# Patient Record
Sex: Male | Born: 1935 | Race: White | Hispanic: No | Smoking: Former smoker
Health system: Southern US, Community
[De-identification: ages and names within clinical notes are randomized; demographics above are authoritative.]

## PROBLEM LIST (undated history)

## (undated) DIAGNOSIS — Z8619 Personal history of other infectious and parasitic diseases: Secondary | ICD-10-CM

## (undated) DIAGNOSIS — E785 Hyperlipidemia, unspecified: Secondary | ICD-10-CM

## (undated) DIAGNOSIS — E039 Hypothyroidism, unspecified: Secondary | ICD-10-CM

## (undated) DIAGNOSIS — I10 Essential (primary) hypertension: Secondary | ICD-10-CM

## (undated) DIAGNOSIS — F810 Specific reading disorder: Secondary | ICD-10-CM

## (undated) DIAGNOSIS — H579 Unspecified disorder of eye and adnexa: Secondary | ICD-10-CM

## (undated) DIAGNOSIS — N400 Enlarged prostate without lower urinary tract symptoms: Secondary | ICD-10-CM

## (undated) DIAGNOSIS — H3530 Unspecified macular degeneration: Secondary | ICD-10-CM

## (undated) HISTORY — PX: POLYPECTOMY: SHX149

## (undated) HISTORY — DX: Specific reading disorder: F81.0

## (undated) HISTORY — DX: Personal history of other infectious and parasitic diseases: Z86.19

## (undated) HISTORY — DX: Unspecified macular degeneration: H35.30

## (undated) HISTORY — DX: Essential (primary) hypertension: I10

## (undated) HISTORY — DX: Hyperlipidemia, unspecified: E78.5

## (undated) HISTORY — DX: Benign prostatic hyperplasia without lower urinary tract symptoms: N40.0

## (undated) HISTORY — DX: Unspecified disorder of eye and adnexa: H57.9

## (undated) HISTORY — DX: Hypothyroidism, unspecified: E03.9

## (undated) HISTORY — PX: COLONOSCOPY: SHX174

---

## 1999-07-18 ENCOUNTER — Emergency Department (HOSPITAL_COMMUNITY): Admission: EM | Admit: 1999-07-18 | Discharge: 1999-07-18 | Payer: Self-pay | Admitting: Emergency Medicine

## 2000-12-09 ENCOUNTER — Encounter: Admission: RE | Admit: 2000-12-09 | Discharge: 2000-12-09 | Payer: Self-pay | Admitting: Urology

## 2000-12-09 ENCOUNTER — Encounter: Payer: Self-pay | Admitting: Urology

## 2002-10-15 LAB — FECAL OCCULT BLOOD, GUAIAC: Fecal Occult Blood: POSITIVE

## 2004-10-31 ENCOUNTER — Ambulatory Visit: Payer: Self-pay | Admitting: Family Medicine

## 2004-11-13 ENCOUNTER — Ambulatory Visit: Payer: Self-pay | Admitting: Family Medicine

## 2005-01-30 ENCOUNTER — Ambulatory Visit: Payer: Self-pay | Admitting: Family Medicine

## 2006-08-25 ENCOUNTER — Ambulatory Visit: Payer: Self-pay | Admitting: Family Medicine

## 2006-08-25 LAB — CONVERTED CEMR LAB
Blood Glucose, Fasting: 70 mg/dL
PSA: 0.98 ng/mL
TSH: 4.99 microintl units/mL

## 2006-09-10 ENCOUNTER — Ambulatory Visit: Payer: Self-pay | Admitting: Gastroenterology

## 2006-09-23 ENCOUNTER — Encounter (INDEPENDENT_AMBULATORY_CARE_PROVIDER_SITE_OTHER): Payer: Self-pay | Admitting: *Deleted

## 2006-09-23 ENCOUNTER — Ambulatory Visit: Payer: Self-pay | Admitting: Gastroenterology

## 2006-09-23 LAB — HM COLONOSCOPY: HM Colonoscopy: NORMAL

## 2006-12-23 ENCOUNTER — Ambulatory Visit: Payer: Self-pay | Admitting: Family Medicine

## 2007-07-28 ENCOUNTER — Encounter: Payer: Self-pay | Admitting: Family Medicine

## 2007-07-28 DIAGNOSIS — E039 Hypothyroidism, unspecified: Secondary | ICD-10-CM

## 2007-07-28 DIAGNOSIS — F528 Other sexual dysfunction not due to a substance or known physiological condition: Secondary | ICD-10-CM

## 2007-07-28 DIAGNOSIS — N400 Enlarged prostate without lower urinary tract symptoms: Secondary | ICD-10-CM

## 2007-07-28 DIAGNOSIS — M109 Gout, unspecified: Secondary | ICD-10-CM

## 2007-07-28 DIAGNOSIS — I10 Essential (primary) hypertension: Secondary | ICD-10-CM | POA: Insufficient documentation

## 2007-09-03 ENCOUNTER — Ambulatory Visit: Payer: Self-pay | Admitting: Family Medicine

## 2007-09-07 LAB — CONVERTED CEMR LAB
ALT: 15 units/L (ref 0–53)
AST: 23 units/L (ref 0–37)
Albumin: 4.2 g/dL (ref 3.5–5.2)
Alkaline Phosphatase: 98 units/L (ref 39–117)
BUN: 6 mg/dL (ref 6–23)
Bilirubin, Direct: 0.2 mg/dL (ref 0.0–0.3)
CO2: 30 meq/L (ref 19–32)
Calcium: 9.6 mg/dL (ref 8.4–10.5)
Chloride: 103 meq/L (ref 96–112)
Cholesterol: 141 mg/dL (ref 0–200)
Creatinine, Ser: 0.9 mg/dL (ref 0.4–1.5)
GFR calc Af Amer: 107 mL/min
GFR calc non Af Amer: 89 mL/min
Glucose, Bld: 81 mg/dL (ref 70–99)
HDL: 20.8 mg/dL — ABNORMAL LOW (ref >39.0)
LDL Cholesterol: 97 mg/dL (ref 0–99)
PSA: 0.6 ng/mL (ref 0.10–4.00)
Phosphorus: 3.7 mg/dL (ref 2.3–4.6)
Potassium: 4.1 meq/L (ref 3.5–5.1)
Sodium: 138 meq/L (ref 135–145)
TSH: 4.6 microintl units/mL (ref 0.35–5.50)
Total Bilirubin: 1 mg/dL (ref 0.3–1.2)
Total CHOL/HDL Ratio: 6.8
Total Protein: 6.8 g/dL (ref 6.0–8.3)
Triglycerides: 118 mg/dL (ref 0–149)
VLDL: 24 mg/dL (ref 0–40)

## 2008-01-05 ENCOUNTER — Ambulatory Visit: Payer: Self-pay | Admitting: Family Medicine

## 2008-01-05 LAB — CONVERTED CEMR LAB
Bacteria, UA: 0
Bilirubin Urine: NEGATIVE
Blood in Urine, dipstick: NEGATIVE
Casts: 0 /lpf
Epithelial cells, urine: 1 /lpf
Glucose, Urine, Semiquant: NEGATIVE
Ketones, urine, test strip: NEGATIVE
Mucus, UA: 0
Nitrite: NEGATIVE
RBC / HPF: 0
Specific Gravity, Urine: <1.005
Urine crystals, microscopic: 0 /hpf
Urobilinogen, UA: 0.2
Yeast, UA: 0
pH: 6

## 2008-09-05 ENCOUNTER — Ambulatory Visit: Payer: Self-pay | Admitting: Family Medicine

## 2008-09-06 LAB — CONVERTED CEMR LAB
ALT: 14 units/L (ref 0–53)
AST: 18 units/L (ref 0–37)
Albumin: 4.3 g/dL (ref 3.5–5.2)
Alkaline Phosphatase: 101 units/L (ref 39–117)
BUN: 13 mg/dL (ref 6–23)
Basophils Absolute: 0.1 10*3/uL (ref 0.0–0.1)
Basophils Relative: 0.8 % (ref 0.0–3.0)
Bilirubin, Direct: 0.1 mg/dL (ref 0.0–0.3)
CO2: 30 meq/L (ref 19–32)
Calcium: 9.4 mg/dL (ref 8.4–10.5)
Chloride: 102 meq/L (ref 96–112)
Cholesterol: 141 mg/dL (ref 0–200)
Creatinine, Ser: 1 mg/dL (ref 0.4–1.5)
Eosinophils Absolute: 0.2 10*3/uL (ref 0.0–0.7)
Eosinophils Relative: 2.3 % (ref 0.0–5.0)
GFR calc Af Amer: 95 mL/min
GFR calc non Af Amer: 78 mL/min
Glucose, Bld: 92 mg/dL (ref 70–99)
HCT: 44.1 % (ref 39.0–52.0)
HDL: 24.9 mg/dL — ABNORMAL LOW (ref >39.0)
Hemoglobin: 15.6 g/dL (ref 13.0–17.0)
LDL Cholesterol: 97 mg/dL (ref 0–99)
Lymphocytes Relative: 32.7 % (ref 12.0–46.0)
MCHC: 35.3 g/dL (ref 30.0–36.0)
MCV: 96.7 fL (ref 78.0–100.0)
Monocytes Absolute: 0.5 10*3/uL (ref 0.1–1.0)
Monocytes Relative: 7.2 % (ref 3.0–12.0)
Neutro Abs: 3.9 10*3/uL (ref 1.4–7.7)
Neutrophils Relative %: 57 % (ref 43.0–77.0)
PSA: 0.54 ng/mL (ref 0.10–4.00)
Phosphorus: 3.8 mg/dL (ref 2.3–4.6)
Platelets: 221 10*3/uL (ref 150–400)
Potassium: 4.3 meq/L (ref 3.5–5.1)
RBC: 4.56 M/uL (ref 4.22–5.81)
RDW: 12.7 % (ref 11.5–14.6)
Sodium: 138 meq/L (ref 135–145)
TSH: 4.86 microintl units/mL (ref 0.35–5.50)
Total Bilirubin: 0.9 mg/dL (ref 0.3–1.2)
Total CHOL/HDL Ratio: 5.7
Total Protein: 7.4 g/dL (ref 6.0–8.3)
Triglycerides: 95 mg/dL (ref 0–149)
Uric Acid, Serum: 5.1 mg/dL (ref 4.0–7.8)
VLDL: 19 mg/dL (ref 0–40)
WBC: 7 10*3/uL (ref 4.5–10.5)

## 2009-09-10 ENCOUNTER — Ambulatory Visit: Payer: Self-pay | Admitting: Family Medicine

## 2009-09-11 LAB — CONVERTED CEMR LAB
ALT: 14 units/L (ref 0–53)
AST: 19 units/L (ref 0–37)
Albumin: 4.4 g/dL (ref 3.5–5.2)
Alkaline Phosphatase: 100 units/L (ref 39–117)
BUN: 6 mg/dL (ref 6–23)
Basophils Absolute: 0 10*3/uL (ref 0.0–0.1)
Basophils Relative: 0.6 % (ref 0.0–3.0)
Bilirubin, Direct: 0.1 mg/dL (ref 0.0–0.3)
CO2: 29 meq/L (ref 19–32)
Calcium: 9.5 mg/dL (ref 8.4–10.5)
Chloride: 103 meq/L (ref 96–112)
Cholesterol: 145 mg/dL (ref 0–200)
Creatinine, Ser: 0.9 mg/dL (ref 0.4–1.5)
Eosinophils Absolute: 0.2 10*3/uL (ref 0.0–0.7)
Eosinophils Relative: 2.7 % (ref 0.0–5.0)
GFR calc non Af Amer: 87.92 mL/min (ref >60)
Glucose, Bld: 96 mg/dL (ref 70–99)
HCT: 46.3 % (ref 39.0–52.0)
HDL: 28.6 mg/dL — ABNORMAL LOW (ref >39.00)
Hemoglobin: 15.5 g/dL (ref 13.0–17.0)
LDL Cholesterol: 99 mg/dL (ref 0–99)
Lymphocytes Relative: 36 % (ref 12.0–46.0)
Lymphs Abs: 2.2 10*3/uL (ref 0.7–4.0)
MCHC: 33.5 g/dL (ref 30.0–36.0)
MCV: 99.6 fL (ref 78.0–100.0)
Monocytes Absolute: 0.4 10*3/uL (ref 0.1–1.0)
Monocytes Relative: 7 % (ref 3.0–12.0)
Neutro Abs: 3.3 10*3/uL (ref 1.4–7.7)
Neutrophils Relative %: 53.7 % (ref 43.0–77.0)
PSA: 0.51 ng/mL (ref 0.10–4.00)
Phosphorus: 3.3 mg/dL (ref 2.3–4.6)
Platelets: 222 10*3/uL (ref 150.0–400.0)
Potassium: 4.4 meq/L (ref 3.5–5.1)
RBC: 4.65 M/uL (ref 4.22–5.81)
RDW: 13 % (ref 11.5–14.6)
Sodium: 138 meq/L (ref 135–145)
TSH: 3.27 microintl units/mL (ref 0.35–5.50)
Total Bilirubin: 1.1 mg/dL (ref 0.3–1.2)
Total CHOL/HDL Ratio: 5
Total Protein: 7.3 g/dL (ref 6.0–8.3)
Triglycerides: 87 mg/dL (ref 0.0–149.0)
Uric Acid, Serum: 5.1 mg/dL (ref 4.0–7.8)
VLDL: 17.4 mg/dL (ref 0.0–40.0)
WBC: 6.1 10*3/uL (ref 4.5–10.5)

## 2009-11-05 ENCOUNTER — Ambulatory Visit: Payer: Self-pay | Admitting: Family Medicine

## 2009-11-05 DIAGNOSIS — E786 Lipoprotein deficiency: Secondary | ICD-10-CM

## 2010-09-03 ENCOUNTER — Telehealth (INDEPENDENT_AMBULATORY_CARE_PROVIDER_SITE_OTHER): Payer: Self-pay | Admitting: *Deleted

## 2010-09-09 ENCOUNTER — Ambulatory Visit: Payer: Self-pay | Admitting: Family Medicine

## 2010-09-10 LAB — CONVERTED CEMR LAB
ALT: 16 units/L (ref 0–53)
AST: 21 units/L (ref 0–37)
Albumin: 4.3 g/dL (ref 3.5–5.2)
Alkaline Phosphatase: 120 units/L — ABNORMAL HIGH (ref 39–117)
BUN: 10 mg/dL (ref 6–23)
Basophils Absolute: 0.1 10*3/uL (ref 0.0–0.1)
CO2: 30 meq/L (ref 19–32)
Chloride: 100 meq/L (ref 96–112)
Cholesterol: 143 mg/dL (ref 0–200)
Eosinophils Relative: 2.5 % (ref 0.0–5.0)
Glucose, Bld: 107 mg/dL — ABNORMAL HIGH (ref 70–99)
HCT: 46.5 % (ref 39.0–52.0)
Lymphs Abs: 2.9 10*3/uL (ref 0.7–4.0)
Monocytes Absolute: 0.7 10*3/uL (ref 0.1–1.0)
Monocytes Relative: 8.8 % (ref 3.0–12.0)
Neutrophils Relative %: 53.8 % (ref 43.0–77.0)
Platelets: 257 10*3/uL (ref 150.0–400.0)
Potassium: 5.4 meq/L — ABNORMAL HIGH (ref 3.5–5.1)
RDW: 14 % (ref 11.5–14.6)
Sodium: 138 meq/L (ref 135–145)
TSH: 6.15 microintl units/mL — ABNORMAL HIGH (ref 0.35–5.50)
Total Protein: 7.4 g/dL (ref 6.0–8.3)
Uric Acid, Serum: 4.8 mg/dL (ref 4.0–7.8)
VLDL: 34.2 mg/dL (ref 0.0–40.0)
WBC: 8.4 10*3/uL (ref 4.5–10.5)

## 2010-09-25 ENCOUNTER — Ambulatory Visit: Payer: Self-pay | Admitting: Family Medicine

## 2010-09-25 DIAGNOSIS — E875 Hyperkalemia: Secondary | ICD-10-CM | POA: Insufficient documentation

## 2010-09-25 DIAGNOSIS — R739 Hyperglycemia, unspecified: Secondary | ICD-10-CM

## 2010-09-25 DIAGNOSIS — R7303 Prediabetes: Secondary | ICD-10-CM | POA: Insufficient documentation

## 2010-10-23 ENCOUNTER — Ambulatory Visit
Admission: RE | Admit: 2010-10-23 | Discharge: 2010-10-23 | Payer: Self-pay | Source: Home / Self Care | Attending: Family Medicine | Admitting: Family Medicine

## 2010-10-23 ENCOUNTER — Other Ambulatory Visit: Payer: Self-pay | Admitting: Family Medicine

## 2010-10-23 LAB — HEMOGLOBIN A1C: Hgb A1c MFr Bld: 5.4 % (ref 4.6–6.5)

## 2010-10-23 LAB — POTASSIUM: Potassium: 4.9 mEq/L (ref 3.5–5.1)

## 2010-10-23 LAB — TSH: TSH: 4.47 u[IU]/mL (ref 0.35–5.50)

## 2010-11-12 NOTE — Assessment & Plan Note (Signed)
Summary: 2 month follow up/[facility]h   Vital Signs:  Patient profile:   77 year old male Weight:      223 pounds BMI:     31.21 Temp:     97.8 degrees F oral Pulse rate:   68 / minute Pulse rhythm:   regular BP sitting:   121 / 82  (left arm) Cuff size:   large  Vitals Entered By: Lowella Petties CMA (November 05, 2009 8:21 AM)  Serial Vital Signs/Assessments:  Time      Position  BP       Pulse  Resp  Temp     By                     130/80                         Judith Part MD  CC: 2 month follow up   History of Present Illness: here for f/u HTN and other med problems   first bp today is 142/100-- then much better after sitting 121/82 pt was in a hurry to get here today last visit did inc zestril to 20 mg daily -- did take hismed this am  no side eff   not as much exercise as he gets in the summer   thyroid stable with tx tsh   lipids stable - trig 87/ HDL 28 (up a bit ) and LDL 99  psa .51- stable   uric acid in nl on allopurinol  Allergies: No Known Drug Allergies  Past History:  Past Medical History: Last updated: October 01, 2008 Gout Hypertension Hypothyroidism Benign prostatic hypertrophy shingles in the past   Past Surgical History: Last updated: 07/28/2007 Colonoscopy- polyps, hemorrhoids (12/2002) Colonoscopy- polyps, hemorrhoids (09/2006)  Family History: Last updated: 10-01-08 Father: deceased- lung cancer, smoker, CAD Mother: deceased- CABG Siblings: 2 brothers, 1 with lung disease from smoking. 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 Eyes:  Denies blurring and eye pain. CV:  Denies chest pain or discomfort, lightheadness,  and palpitations. Resp:  Denies cough and wheezing. GI:  Denies abdominal pain and change in bowel habits. GU:  Denies urinary frequency. MS:  Denies muscle aches. Derm:  Denies lesion(s), poor wound healing, and rash. Neuro:  Denies numbness and tingling. Psych:  mood is generally good. Endo:  Denies cold intolerance and heat intolerance. Heme:  Denies abnormal bruising and bleeding.  Physical Exam  General:  Well-developed,well-nourished,in no acute distress; alert,appropriate and cooperative throughout examination Head:  normocephalic, atraumatic, and no abnormalities observed.   Eyes:  vision grossly intact, pupils equal, pupils round, and pupils reactive to light.  no conjunctival pallor, injection or icterus  Neck:  supple with full rom and no masses or thyromegally, no JVD or carotid bruit  Lungs:  Normal respiratory effort, chest expands symmetrically. Lungs are clear to auscultation, no crackles or wheezes. Heart:  Normal rate and regular rhythm. S1 and S2 normal without gallop, murmur, click, rub or other extra sounds. Extremities:  No clubbing, cyanosis, edema, or deformity noted with normal full range of motion of all joints.   Neurologic:  gait normal and DTRs symmetrical  and normal.   Skin:  Intact without suspicious lesions or rashes Cervical Nodes:  No lymphadenopathy noted Psych:  normal affect, talkative and pleasant    Impression & Recommendations:  Problem # 1:  HYPERTENSION (ICD-401.9) Assessment Improved  improved with zestril 20-- will continue that dose disc healthy diet (low simple sugar/ choose complex carbs/ low sat fat) diet and exercise in detail  f/u 1 year  labs reviewed today His updated medication list for this problem includes:    Zestril 20 Mg Tabs (Lisinopril) .Marland Kitchen... 1 by mouth once daily  BP today: 121/82 Prior BP: 156/74 (09/10/2009)  Labs Reviewed: K+: 4.4 (09/10/2009) Creat: : 0.9 (09/10/2009)   Chol: 145 (09/10/2009)   HDL: 28.60  (09/10/2009)   LDL: 99 (09/10/2009)   TG: 87.0 (09/10/2009)  Problem # 2:  HYPOTHYROIDISM (ICD-244.9) Assessment: Unchanged  tsh is stable without meds- will continue to monitor no clinical changes   Labs Reviewed: TSH: 3.27 (09/10/2009)    Chol: 145 (09/10/2009)   HDL: 28.60 (09/10/2009)   LDL: 99 (09/10/2009)   TG: 87.0 (09/10/2009)  Problem # 3:  LOW HDL (ICD-272.5) Assessment: Unchanged  with nl LDL  disc omega 3 fish oil- will add that and work on exercise re check 1 y  Labs Reviewed: SGOT: 19 (09/10/2009)   SGPT: 14 (09/10/2009)   HDL:28.60 (09/10/2009), 24.9 (09/05/2008)  LDL:99 (09/10/2009), 97 (09/05/2008)  Chol:145 (09/10/2009), 141 (09/05/2008)  Trig:87.0 (09/10/2009), 95 (09/05/2008)  Complete Medication List: 1)  Allopurinol 100 Mg Tabs (Allopurinol) .... One by mouth once daily 2)  Zestril 20 Mg Tabs (Lisinopril) .Marland Kitchen.. 1 by mouth once daily  Patient Instructions: 1)  get omega 3 fish oil -- take 1000-3000 mg daily 2)  this will help your good cholesterol (along with exercise) 3)  blood pressure is better today 4)  no change in medicines  5)  follow up in about a year   Prior Medications (reviewed today): ALLOPURINOL 100 MG  TABS (ALLOPURINOL) one by mouth once daily ZESTRIL 20 MG TABS (LISINOPRIL) 1 by mouth once daily Current Allergies: No known allergies

## 2010-11-12 NOTE — Progress Notes (Signed)
----   Converted from flag ---- ---- 09/03/2010 10:52 AM, Colon Flattery Tower MD wrote: please check lipid/hepatic/ renal / cbc with diff/ tsh / uric acid / psa for gout/ prostate screen/ hypothyroidism   ---- 08/30/2010 12:10 PM, Liane Comber CMA (AAMA) wrote: Lab orders please! Good Morning! This pt is scheduled for cpx labs Monday, which labs to draw and dx codes to use? Thanks Tasha ------------------------------

## 2010-11-14 NOTE — Assessment & Plan Note (Signed)
Summary: CPX/DLO  R/S FROM 09/16/10   Vital Signs:  Patient profile:   77 year old male Height:      71.5 inches Weight:      223.50 pounds BMI:     30.85 Temp:     98.1 degrees F oral Pulse rate:   72 / minute Pulse rhythm:   regular BP sitting:   124 / 76  (left arm) Cuff size:   large  Vitals Entered By: Lewanda Rife LPN (September 25, 2010 8:36 AM) CC: CPX   History of Present Illness: here for check up of chronic med problems and also to [facility] mt list   wt is stable with bmi of 30  gout-- uric acid nl  no problems with gout at all   hypothyroid - tsh is up -- ? missed doses has had some trouble with skin being dry  no fatigue   bph -- psa is low at .53 no problems - no nocturia    colonosc 12/07 polyp--due 03-Mar-2011  Td 2010 flu shot utd  K was 5.4 --? why is on ace inhibitor  eats at least 1 banana per day    lipids stable Last Lipid ProfileCholesterol: 143 (09/09/2010 9:01:22 AM)HDL:  24.80 (09/09/2010 9:01:22 AM)LDL:  84 (09/09/2010 9:01:22 AM)Triglycerides:  Last Liver profileSGOT:  21 (09/09/2010 9:01:22 AM)SPGT:  16 (09/09/2010 9:01:22 AM)T. Bili:  0.8 (09/09/2010 9:01:22 AM)Alk Phos:  120 (09/09/2010 9:01:22 AM)   glucose 107- need to watch this   ? pneumovax-- is considering based on cost  ? zoster -- consid based on cost   Allergies (verified): No Known Drug Allergies  Past History:  Past Medical History: Last updated: 2008/09/14 Gout Hypertension Hypothyroidism Benign prostatic hypertrophy shingles in the past   Past Surgical History: Last updated: 07/28/2007 Colonoscopy- polyps, hemorrhoids (12/2002) Colonoscopy- polyps, hemorrhoids (09/2006)  Family History: Last updated: 14-Sep-2008 Father: deceased- lung cancer, smoker, CAD Mother: deceased- CABG Siblings: 2 brothers, 1 with lung disease from smoking. 5 sisters brother with lung cancer from smoking   Social History: Last updated: September 14, 2008 Marital Status:  Married Children: 1 child who died in MVA in 1979-03-03 Occupation: construction quit smoking 1970 no alcohol - in 20 years   Risk Factors: Smoking Status: quit (07/28/2007)  Review of Systems General:  Denies fatigue, loss of appetite, and malaise. Eyes:  Denies blurring and eye irritation. CV:  Denies chest pain or discomfort, lightheadness, palpitations, and shortness of breath with exertion. GI:  Denies abdominal pain, change in bowel habits, indigestion, nausea, and vomiting. GU:  Complains of erectile dysfunction; denies urinary frequency and urinary hesitancy. MS:  Denies joint pain, joint redness, and joint swelling. Derm:  Denies itching, lesion(s), poor wound healing, and rash. Neuro:  Denies numbness and tingling. Psych:  Denies anxiety and depression. Endo:  Denies cold intolerance, excessive thirst, excessive urination, and heat intolerance. Heme:  Denies abnormal bruising and bleeding.  Physical Exam  General:  Well-developed,well-nourished,in no acute distress; alert,appropriate and cooperative throughout examination Head:  normocephalic, atraumatic, and no abnormalities observed.   Eyes:  vision grossly intact, pupils equal, pupils round, and pupils reactive to light.  no conjunctival pallor, injection or icterus  Ears:  R ear normal and L ear normal.   Nose:  no nasal discharge.   Mouth:  pharynx pink and moist.   Neck:  supple with full rom and no masses or thyromegally, no JVD or carotid bruit  Chest Wall:  No deformities, masses, tenderness or gynecomastia noted. Lungs:  Normal respiratory effort, chest expands symmetrically. Lungs are clear to auscultation, no crackles or wheezes. Heart:  Normal rate and regular rhythm. S1 and S2 normal without gallop, murmur, click, rub or other extra sounds. Abdomen:  Bowel sounds positive,abdomen soft and non-tender without masses, organomegaly or hernias noted. no renal bruits  Rectal:  No external abnormalities noted. Normal  sphincter tone. No rectal masses or tenderness. Prostate:  prostate mildly enl - about 30 g  nontender, smooth, symmetric no change from last exam Msk:  No deformity or scoliosis noted of thoracic or lumbar spine.  no acute joint changes  Pulses:  R and L carotid,radial,femoral,dorsalis pedis and posterior tibial pulses are full and equal bilaterally Extremities:  No clubbing, cyanosis, edema, or deformity noted with normal full range of motion of all joints.   Neurologic:  sensation intact to light touch, gait normal, and DTRs symmetrical and normal.   Skin:  Intact without suspicious lesions or rashes Cervical Nodes:  No lymphadenopathy noted Inguinal Nodes:  No significant adenopathy Psych:  normal affect, talkative and pleasant    Impression & Recommendations:  Problem # 1:  LOW HDL (ICD-272.5) Assessment Unchanged  cholesterol is about the same disc exercise and fish oil as needed  disc omega 3 and exercise to inc hdl  Labs Reviewed: SGOT: 21 (09/09/2010)   SGPT: 16 (09/09/2010)   HDL:24.80 (09/09/2010), 28.60 (09/10/2009)  LDL:84 (09/09/2010), 99 (09/10/2009)  Chol:143 (09/09/2010), 145 (09/10/2009)  Trig:171.0 (09/09/2010), 87.0 (09/10/2009)  Problem # 2:  BENIGN PROSTATIC HYPERTROPHY (ICD-600.00) Assessment: Unchanged this is stable dre today psa is normal   Problem # 3:  HYPOTHYROIDISM (ICD-244.9) Assessment: Deteriorated  now requires some low dose synthroid  no symptoms except dry skin will px this and then follow in 1 mo   His updated medication list for this problem includes:    Synthroid 25 Mcg Tabs (Levothyroxine sodium) .Marland Kitchen... 1 by mouth once daily  Orders: Flu Vaccine 2yrs + MEDICARE PATIENTS (Z6109) Prescription Created Electronically [phone])  Problem # 4:  HYPERTENSION (ICD-401.9) Assessment: Unchanged  bp good in light of new hyperkalemia cut in 1/2 and then re check  disc low sodium diet  His updated medication list for this problem includes:     Zestril 20 Mg Tabs (Lisinopril) .Marland Kitchen... 1/2  by mouth once daily  BP today: 124/76 Prior BP: 121/82 (11/05/2009)  Labs Reviewed: K+: 5.4 (09/09/2010) Creat: : 1.0 (09/09/2010)   Chol: 143 (09/09/2010)   HDL: 24.80 (09/09/2010)   LDL: 84 (09/09/2010)   TG: 171.0 (09/09/2010)  Problem # 5:  GOUT (ICD-274.9) Assessment: Unchanged  this is very well controlled with allopurinol  uric acid nl and no gout flares  His updated medication list for this problem includes:    Allopurinol 100 Mg Tabs (Allopurinol) ..... One by mouth once daily  Orders: Flu Vaccine 3yrs + MEDICARE PATIENTS (U9811) Prescription Created Electronically [phone])  Problem # 6:  HYPERGLYCEMIA (ICD-790.29) Assessment: New 107- mildly high disc low sugar diet /exercise  will check AIc in 1 mo   Problem # 7:  HYPERKALEMIA (ICD-276.7) Assessment: New this is new will cut dose of ace and see if it comes down no supplements with K re check 1 mo with bp check  Complete Medication List: 1)  Allopurinol 100 Mg Tabs (Allopurinol) .... One by mouth once daily 2)  Zestril 20 Mg Tabs (Lisinopril) .... 1/2  by mouth once daily 3)  Synthroid 25 Mcg Tabs (Levothyroxine sodium) .Marland Kitchen.. 1 by mouth once daily  Other Orders: Administration Flu vaccine - MCR [phone])  Patient Instructions: 1)  if you are interested in pneumonia shot - call your medicare insurance and find out if it is covered (you could get it in a month)  2)  also if you are interested in shingles shot -- also call your insurance  3)  watch sugars in diet -- your sugar is up a bit 4)  start synthroid for low thyroid function 5)  cut your lidinopril (zestril ) in 1/2 and take half pill per day 6)  schedule labs non fasting in 1 mo for tsh/ potassium level and AIC for 244.9 and hyperglycemia and hyperkalemia  7)  please do nurse visit that day also for bp check for HTN Prescriptions: SYNTHROID 25 MCG TABS (LEVOTHYROXINE SODIUM) 1 by mouth once daily  #30 x 3    Entered and Authorized by:   Judith Part MD   Signed by:   Judith Part MD on 09/25/2010   Method used:   Electronically to        [facility] [address]* (retail)       [address]       [place], [place]  [address]       Ph: [phone]       Fax: [phone]   RxID:   [id number] ALLOPURINOL 100 MG  TABS (ALLOPURINOL) one by mouth once daily  #90 x 3   Entered and Authorized by:   Judith Part MD   Signed by:   Judith Part MD on 09/25/2010   Method used:   Electronically to        [facility] [address] (retail)       [address]       [place], [place]  [address]       Ph: [phone]       Fax: [phone]   RxID:   [id number]    Orders Added: 1)  Flu Vaccine 72yrs + MEDICARE PATIENTS [Q2039] 2)  Administration Flu vaccine - MCR [G0008] 3)  Prescription Created Electronically [G8553] 4)  Est. Patient Level IV [60109]    Current Allergies (reviewed today): No known allergies     Flu Vaccine Consent Questions     Do you have a history of severe allergic reactions to this vaccine? no    Any prior history of allergic reactions to egg and/or gelatin? no    Do you have a sensitivity to the preservative Thimersol? no    Do you have a past history of Guillan-Barre Syndrome? no    Do you currently have an acute febrile illness? no    Have you ever had a severe reaction to latex? no    Vaccine information given and explained to patient? yes    Are you currently pregnant? no    Lot Number:[id number]   Exp Date:04/12/2011   Site Given  Left Deltoid IMmedflu1 Lewanda Rife LPN  September 25, 2010 8:41 AM

## 2010-11-14 NOTE — Assessment & Plan Note (Signed)
Summary: TOWER BP CHECK PER [facility]  Nurse Visit   Vital Signs:  Patient profile:   77 year old male Height:      71.5 inches Weight:      223.50 pounds Pulse rate:   76 / minute Pulse rhythm:   regular BP sitting:   142 / 80  (right arm) Cuff size:   large  Vitals Entered By: Delilah Shan CMA Duncan Dull) (October 23, 2010 9:35 AM) CC: Nurse visit - BP check per Dr. Milinda Antis.  Pt. states considering  lowering  BP med.   Allergies: No Known Drug Allergies  Orders Added: 1)  Est. Patient Level I [04540]  Appended Document: TOWER BP CHECK PER [facility] please tell pt to stay on current dose of med   Appended Document: TOWER BP CHECK PER [facility] Patient notified as instructed by telephone.

## 2011-02-03 ENCOUNTER — Other Ambulatory Visit: Payer: Self-pay | Admitting: *Deleted

## 2011-02-03 MED ORDER — LEVOTHYROXINE SODIUM 25 MCG PO TABS: 25.0000 ug | ORAL_TABLET | Freq: Every day | ORAL | Status: DC

## 2011-05-12 ENCOUNTER — Other Ambulatory Visit: Payer: Self-pay | Admitting: Dermatology

## 2011-09-10 ENCOUNTER — Other Ambulatory Visit (INDEPENDENT_AMBULATORY_CARE_PROVIDER_SITE_OTHER): Payer: Self-pay

## 2011-09-10 ENCOUNTER — Telehealth: Payer: Self-pay | Admitting: Family Medicine

## 2011-09-10 DIAGNOSIS — R7309 Other abnormal glucose: Secondary | ICD-10-CM

## 2011-09-10 DIAGNOSIS — E875 Hyperkalemia: Secondary | ICD-10-CM

## 2011-09-10 DIAGNOSIS — I10 Essential (primary) hypertension: Secondary | ICD-10-CM

## 2011-09-10 DIAGNOSIS — N400 Enlarged prostate without lower urinary tract symptoms: Secondary | ICD-10-CM

## 2011-09-10 DIAGNOSIS — Z125 Encounter for screening for malignant neoplasm of prostate: Secondary | ICD-10-CM

## 2011-09-10 DIAGNOSIS — E039 Hypothyroidism, unspecified: Secondary | ICD-10-CM

## 2011-09-10 DIAGNOSIS — E786 Lipoprotein deficiency: Secondary | ICD-10-CM

## 2011-09-10 LAB — LIPID PANEL
LDL Cholesterol: 89 mg/dL (ref 0–99)
Total CHOL/HDL Ratio: 5
Triglycerides: 125 mg/dL (ref 0.0–149.0)
VLDL: 25 mg/dL (ref 0.0–40.0)

## 2011-09-10 LAB — CBC WITH DIFFERENTIAL/PLATELET
Basophils Absolute: 0 10*3/uL (ref 0.0–0.1)
Eosinophils Absolute: 0.2 10*3/uL (ref 0.0–0.7)
Lymphocytes Relative: 33.1 % (ref 12.0–46.0)
MCHC: 33.5 g/dL (ref 30.0–36.0)
Neutro Abs: 3.6 10*3/uL (ref 1.4–7.7)
Neutrophils Relative %: 55.2 % (ref 43.0–77.0)
RDW: 13.6 % (ref 11.5–14.6)

## 2011-09-10 LAB — COMPREHENSIVE METABOLIC PANEL
ALT: 17 U/L (ref 0–53)
AST: 24 U/L (ref 0–37)
Calcium: 9.8 mg/dL (ref 8.4–10.5)
Chloride: 101 mEq/L (ref 96–112)
Creatinine, Ser: 0.9 mg/dL (ref 0.4–1.5)
Potassium: 4.8 mEq/L (ref 3.5–5.1)

## 2011-09-10 NOTE — Telephone Encounter (Signed)
Message copied by [name] on Wed Sep 10, 2011  8:09 AM ------      Message from: Alvina Chou      Created: Mon Sep 08, 2011 11:59 AM      Regarding: labs for [facility] 11.28       Patient is scheduled for CPX labs, please order future labs, Thanks , Camelia Eng

## 2011-09-16 ENCOUNTER — Encounter: Payer: Self-pay | Admitting: Family Medicine

## 2011-09-17 ENCOUNTER — Ambulatory Visit (INDEPENDENT_AMBULATORY_CARE_PROVIDER_SITE_OTHER): Payer: Medicare Other | Admitting: Family Medicine

## 2011-09-17 ENCOUNTER — Encounter: Payer: Self-pay | Admitting: Family Medicine

## 2011-09-17 VITALS — BP 130/80 | HR 68 | Temp 98.1°F | Ht 71.25 in | Wt 223.8 lb

## 2011-09-17 DIAGNOSIS — E039 Hypothyroidism, unspecified: Secondary | ICD-10-CM

## 2011-09-17 DIAGNOSIS — Z23 Encounter for immunization: Secondary | ICD-10-CM

## 2011-09-17 DIAGNOSIS — I10 Essential (primary) hypertension: Secondary | ICD-10-CM

## 2011-09-17 DIAGNOSIS — N400 Enlarged prostate without lower urinary tract symptoms: Secondary | ICD-10-CM

## 2011-09-17 DIAGNOSIS — B360 Pityriasis versicolor: Secondary | ICD-10-CM | POA: Insufficient documentation

## 2011-09-17 DIAGNOSIS — E786 Lipoprotein deficiency: Secondary | ICD-10-CM

## 2011-09-17 DIAGNOSIS — R7309 Other abnormal glucose: Secondary | ICD-10-CM

## 2011-09-17 DIAGNOSIS — Z125 Encounter for screening for malignant neoplasm of prostate: Secondary | ICD-10-CM

## 2011-09-17 MED ORDER — LEVOTHYROXINE SODIUM 25 MCG PO TABS: 25.0000 ug | ORAL_TABLET | Freq: Every day | ORAL | Status: DC

## 2011-09-17 MED ORDER — ALLOPURINOL 100 MG PO TABS: 100.0000 mg | ORAL_TABLET | Freq: Every day | ORAL | Status: DC

## 2011-09-17 MED ORDER — LISINOPRIL 20 MG PO TABS: 20.0000 mg | ORAL_TABLET | Freq: Every day | ORAL | Status: DC

## 2011-09-17 NOTE — Assessment & Plan Note (Signed)
a1c remains below 6 Disc low glycemic diet - watching sweets and staying active Lab Results  Component Value Date   HGBA1C 5.4 09/10/2011   will continue to follow

## 2011-09-17 NOTE — Progress Notes (Signed)
Subjective:    Patient ID: Jamie Byrd, male    DOB: 1934-03-01, 77 y.o.   MRN: [id number]  HPI Here for check up of chronic medical problems and also to review health mt list  Has been feeling good - nothing new    Hypothyroid stable No symptoms  Lab Results  Component Value Date   TSH 4.53 09/10/2011   Wt is stable and no skin or hair changes  bmi is 30  Low HDL - is 27 Lab Results  Component Value Date   CHOL 141 09/10/2011   CHOL 143 09/09/2010   CHOL 145 09/10/2009   Lab Results  Component Value Date   HDL 27.40* 09/10/2011   HDL 24.80* 09/09/2010   HDL 28.60* 09/10/2009   Lab Results  Component Value Date   LDLCALC 89 09/10/2011   LDLCALC 84 09/09/2010   LDLCALC 99 09/10/2009   Lab Results  Component Value Date   TRIG 125.0 09/10/2011   TRIG 171.0* 09/09/2010   TRIG 87.0 09/10/2009   Lab Results  Component Value Date   CHOLHDL 5 09/10/2011   CHOLHDL 6 09/09/2010   CHOLHDL 5 09/10/2009   No results found for this basename: LDLDIRECT   up a little this year Good LDL - diet is good  Tried fish oil in the past   bp is     Today- up 150/80 Is taking lisinopril 20 No cp or palpitations or headaches or edema  No side effects to medicines   Took his med this am  Walks for exercise - tries to walk every day - some more than others - depends on the weather   Hx of BPH no more problems than usual Nocturia is 1 time  Good psa at .55  Symptoms - no pain , stream is the same   Hyperglycemia in good control with diet  a1c is 5.4- stable  Diet -- is fair - but does not really pay attention to sugar -- may have sweets now and then   imms zost-- ? If ins would pay for it - has medicare  Flu-- needs it - got it today -  Pneumovax= will get that today   colonosc 07 did find polyps   Patient Active Problem List  Diagnoses  . HYPOTHYROIDISM  . LOW HDL  . GOUT  . ERECTILE DYSFUNCTION  . HYPERTENSION  . BENIGN PROSTATIC HYPERTROPHY  .  HYPERGLYCEMIA  . Prostate cancer screening  . Tinea versicolor   Past Medical History  Diagnosis Date  . Gout   . Hypertension   . Hypothyroidism   . BPH (benign prostatic hypertrophy)   . History of shingles    No past surgical history on file. History  Substance Use Topics  . Smoking status: Former Smoker    Quit date: 10/13/1968  . Smokeless tobacco: Not on file  . Alcohol Use: No     no alcohol in 20 years   Family History  Problem Relation Age of Onset  . Heart disease Mother     CABG  . Heart disease Father     CAD  . Cancer Father     lung CA  . Cancer Brother     lung CA   No Known Allergies No current outpatient prescriptions on file prior to visit.      Review of Systems Review of Systems  Constitutional: Negative for fever, appetite change, fatigue and unexpected weight change.  Eyes: Negative for pain and visual disturbance.  Respiratory: Negative for cough and shortness of breath.   Cardiovascular: Negative for cp or palpitations    Gastrointestinal: Negative for nausea, diarrhea and constipation.  Genitourinary: Negative for urgency and frequency.  Skin: Negative for pallor or rash   Neurological: Negative for weakness, light-headedness, numbness and headaches.  Hematological: Negative for adenopathy. Does not bruise/bleed easily.  Psychiatric/Behavioral: Negative for dysphoric mood. The patient is not nervous/anxious.          Objective:   Physical Exam  Constitutional: He appears well-developed and well-nourished. No distress.  HENT:  Head: Normocephalic and atraumatic.  Right Ear: External ear normal.  Left Ear: External ear normal.  Nose: Nose normal.  Mouth/Throat: Oropharynx is clear and moist.       Hard of hearing Mild cerumen bilat   Eyes: Conjunctivae and EOM are normal. Pupils are equal, round, and reactive to light. No scleral icterus.  Neck: Normal range of motion. Neck supple. No JVD present. Carotid bruit is not present.  Erythema present. No thyromegaly present.  Cardiovascular: Normal rate, regular rhythm, normal heart sounds and intact distal pulses.  Exam reveals no gallop.   Pulmonary/Chest: Effort normal and breath sounds normal. No respiratory distress. He has no wheezes. He exhibits no tenderness.  Abdominal: Soft. Bowel sounds are normal. He exhibits no distension, no abdominal bruit and no mass. There is no tenderness.  Genitourinary: Rectal exam shows external hemorrhoid. Rectal exam shows no mass and no tenderness. Guaiac negative stool. Prostate is enlarged. Prostate is not tender.       Mildly enlarged prostate- baseline Smooth/ symmmetric and nt  Musculoskeletal: He exhibits no edema and no tenderness.  Lymphadenopathy:    He has no cervical adenopathy.       Right: No inguinal adenopathy present.       Left: No inguinal adenopathy present.  Neurological: He is alert. He has normal reflexes. No cranial nerve deficit. He exhibits normal muscle tone. Coordination normal.  Skin: Skin is warm and dry. Rash noted. No erythema. No pallor.       Many SKs on back  Rash on chest consistent with tinea versicolor-patches of tan to red macule , scant scale and pt states it is itchy    Psychiatric: He has a normal mood and affect.          Assessment & Plan:

## 2011-09-17 NOTE — Assessment & Plan Note (Signed)
27 this check Disc imp of exercise Again enc to try omega 3 fish oil  He will do that

## 2011-09-17 NOTE — Assessment & Plan Note (Signed)
psa and DRE stable

## 2011-09-17 NOTE — Assessment & Plan Note (Signed)
Better bp on 2nd check at 130/80 No problems Ace refilled  Disc good lifestyle habits

## 2011-09-17 NOTE — Assessment & Plan Note (Signed)
theraputic tsh and no clinical changes Current dose of synthroid refilled

## 2011-09-17 NOTE — Assessment & Plan Note (Signed)
On chest - suggested selsun blue lather twice weekly- see inst  Update if not imp in the itching

## 2011-09-17 NOTE — Patient Instructions (Addendum)
For the rash on your chest - try a dandruff shampoo like selsun blue- lather on your chest twice weekly and then rinse after 5 minutes This may help with the itching  You can try fish oil for your cholesterol (to boost the HDL) 1000-3000 mg daily  Pneumonia vaccine today (flu shot today too) Let me know when you are ready to schedule your colonoscopy If you are interested in shingles vaccine in future - call your insurance company to see how coverage is and call us to schedule

## 2011-09-17 NOTE — Assessment & Plan Note (Signed)
Stable DRE and psa and pt has no new complaints Will  Continue to follow

## 2011-10-22 ENCOUNTER — Encounter: Payer: Self-pay | Admitting: Gastroenterology

## 2011-11-10 ENCOUNTER — Other Ambulatory Visit: Payer: Self-pay | Admitting: Dermatology

## 2011-11-10 DIAGNOSIS — C44319 Basal cell carcinoma of skin of other parts of face: Secondary | ICD-10-CM | POA: Diagnosis not present

## 2011-11-10 DIAGNOSIS — D485 Neoplasm of uncertain behavior of skin: Secondary | ICD-10-CM | POA: Diagnosis not present

## 2011-11-10 DIAGNOSIS — L259 Unspecified contact dermatitis, unspecified cause: Secondary | ICD-10-CM | POA: Diagnosis not present

## 2011-11-10 DIAGNOSIS — L570 Actinic keratosis: Secondary | ICD-10-CM | POA: Diagnosis not present

## 2011-12-04 DIAGNOSIS — C44319 Basal cell carcinoma of skin of other parts of face: Secondary | ICD-10-CM | POA: Diagnosis not present

## 2011-12-11 DIAGNOSIS — L570 Actinic keratosis: Secondary | ICD-10-CM | POA: Diagnosis not present

## 2012-05-10 ENCOUNTER — Other Ambulatory Visit: Payer: Self-pay | Admitting: Dermatology

## 2012-05-10 DIAGNOSIS — C44319 Basal cell carcinoma of skin of other parts of face: Secondary | ICD-10-CM | POA: Diagnosis not present

## 2012-05-10 DIAGNOSIS — C44320 Squamous cell carcinoma of skin of unspecified parts of face: Secondary | ICD-10-CM | POA: Diagnosis not present

## 2012-05-10 DIAGNOSIS — L821 Other seborrheic keratosis: Secondary | ICD-10-CM | POA: Diagnosis not present

## 2012-05-10 DIAGNOSIS — D0439 Carcinoma in situ of skin of other parts of face: Secondary | ICD-10-CM | POA: Diagnosis not present

## 2012-05-10 DIAGNOSIS — Z85828 Personal history of other malignant neoplasm of skin: Secondary | ICD-10-CM | POA: Diagnosis not present

## 2012-05-10 DIAGNOSIS — L570 Actinic keratosis: Secondary | ICD-10-CM | POA: Diagnosis not present

## 2012-07-13 ENCOUNTER — Encounter: Payer: Self-pay | Admitting: Gastroenterology

## 2012-07-14 DIAGNOSIS — H353190 Nonexudative age-related macular degeneration, unspecified eye, stage unspecified: Secondary | ICD-10-CM | POA: Diagnosis not present

## 2012-07-14 DIAGNOSIS — H43819 Vitreous degeneration, unspecified eye: Secondary | ICD-10-CM | POA: Diagnosis not present

## 2012-07-23 ENCOUNTER — Ambulatory Visit (INDEPENDENT_AMBULATORY_CARE_PROVIDER_SITE_OTHER): Payer: Medicare Other

## 2012-07-23 DIAGNOSIS — Z23 Encounter for immunization: Secondary | ICD-10-CM

## 2012-10-28 DIAGNOSIS — H02059 Trichiasis without entropian unspecified eye, unspecified eyelid: Secondary | ICD-10-CM | POA: Diagnosis not present

## 2012-11-08 DIAGNOSIS — D239 Other benign neoplasm of skin, unspecified: Secondary | ICD-10-CM | POA: Diagnosis not present

## 2012-11-08 DIAGNOSIS — Z85828 Personal history of other malignant neoplasm of skin: Secondary | ICD-10-CM | POA: Diagnosis not present

## 2012-11-08 DIAGNOSIS — L819 Disorder of pigmentation, unspecified: Secondary | ICD-10-CM | POA: Diagnosis not present

## 2012-11-08 DIAGNOSIS — L570 Actinic keratosis: Secondary | ICD-10-CM | POA: Diagnosis not present

## 2012-11-08 DIAGNOSIS — L821 Other seborrheic keratosis: Secondary | ICD-10-CM | POA: Diagnosis not present

## 2012-11-24 ENCOUNTER — Other Ambulatory Visit: Payer: Self-pay | Admitting: Family Medicine

## 2012-12-08 ENCOUNTER — Encounter: Payer: Self-pay | Admitting: Gastroenterology

## 2012-12-08 ENCOUNTER — Encounter: Payer: Self-pay | Admitting: Family Medicine

## 2012-12-08 ENCOUNTER — Ambulatory Visit (INDEPENDENT_AMBULATORY_CARE_PROVIDER_SITE_OTHER): Payer: Medicare Other | Admitting: Family Medicine

## 2012-12-08 ENCOUNTER — Other Ambulatory Visit: Payer: Self-pay | Admitting: Family Medicine

## 2012-12-08 VITALS — BP 122/86 | HR 58 | Temp 97.8°F | Ht 71.5 in | Wt 221.8 lb

## 2012-12-08 DIAGNOSIS — Z860100 Personal history of colon polyps, unspecified: Secondary | ICD-10-CM

## 2012-12-08 DIAGNOSIS — I10 Essential (primary) hypertension: Secondary | ICD-10-CM | POA: Diagnosis not present

## 2012-12-08 DIAGNOSIS — M109 Gout, unspecified: Secondary | ICD-10-CM

## 2012-12-08 DIAGNOSIS — R7309 Other abnormal glucose: Secondary | ICD-10-CM | POA: Diagnosis not present

## 2012-12-08 DIAGNOSIS — E039 Hypothyroidism, unspecified: Secondary | ICD-10-CM | POA: Diagnosis not present

## 2012-12-08 DIAGNOSIS — N400 Enlarged prostate without lower urinary tract symptoms: Secondary | ICD-10-CM | POA: Diagnosis not present

## 2012-12-08 LAB — COMPREHENSIVE METABOLIC PANEL
ALT: 18 U/L (ref 0–53)
AST: 25 U/L (ref 0–37)
Albumin: 4.4 g/dL (ref 3.5–5.2)
Calcium: 9.8 mg/dL (ref 8.4–10.5)
Chloride: 98 mEq/L (ref 96–112)
Potassium: 4.7 mEq/L (ref 3.5–5.1)
Total Protein: 7.4 g/dL (ref 6.0–8.3)

## 2012-12-08 LAB — CBC WITH DIFFERENTIAL/PLATELET
Basophils Absolute: 0 10*3/uL (ref 0.0–0.1)
Eosinophils Absolute: 0.2 10*3/uL (ref 0.0–0.7)
Lymphocytes Relative: 32.5 % (ref 12.0–46.0)
MCHC: 34.1 g/dL (ref 30.0–36.0)
Neutrophils Relative %: 53.8 % (ref 43.0–77.0)
RBC: 4.92 Mil/uL (ref 4.22–5.81)
RDW: 13.2 % (ref 11.5–14.6)

## 2012-12-08 LAB — LIPID PANEL
LDL Cholesterol: 78 mg/dL (ref 0–99)
Total CHOL/HDL Ratio: 5

## 2012-12-08 LAB — URIC ACID: Uric Acid, Serum: 4 mg/dL (ref 4.0–7.8)

## 2012-12-08 LAB — PSA: PSA: 0.49 ng/mL (ref 0.10–4.00)

## 2012-12-08 MED ORDER — LEVOTHYROXINE SODIUM 25 MCG PO TABS: 25.0000 ug | ORAL_TABLET | Freq: Every day | ORAL | Status: DC

## 2012-12-08 MED ORDER — LISINOPRIL 20 MG PO TABS: 20.0000 mg | ORAL_TABLET | Freq: Every day | ORAL | Status: DC

## 2012-12-08 MED ORDER — ALLOPURINOL 100 MG PO TABS: 100.0000 mg | ORAL_TABLET | Freq: Every day | ORAL | Status: DC

## 2012-12-08 NOTE — Progress Notes (Signed)
Subjective:    Patient ID: Jamie Byrd, male    DOB: 05-02-1934, 79 y.o.   MRN: [id number]  HPI Here for check up of chronic medical conditions and to review health mt list   Is feeling good -no new problems at all   Wt is down 2 lb with bmi of 30 Is watching what he eats  Some exercise- not as much in the winter- looking forward to getting outdoors   Zoster status-had dz  colonosc 12/07- pt does not remember recall ,no bowel changes   Is having eye problems- sees retinal specialist frequently- ? Disorder  (? Poss macular degeneration)  Hx of BPH Lab Results  Component Value Date   PSA 0.55 09/10/2011   PSA 0.53 09/09/2010   PSA 0.51 09/10/2009   no change at all  Nocturia is 3 times-normal for him Stream is the same   Lipid Lab Results  Component Value Date   CHOL 141 09/10/2011   CHOL 143 09/09/2010   CHOL 145 09/10/2009   Lab Results  Component Value Date   HDL 27.40* 09/10/2011   HDL 24.80* 09/09/2010   HDL 28.60* 09/10/2009   Lab Results  Component Value Date   LDLCALC 89 09/10/2011   LDLCALC 84 09/09/2010   LDLCALC 99 09/10/2009   Lab Results  Component Value Date   TRIG 125.0 09/10/2011   TRIG 171.0* 09/09/2010   TRIG 87.0 09/10/2009   Lab Results  Component Value Date   CHOLHDL 5 09/10/2011   CHOLHDL 6 09/09/2010   CHOLHDL 5 09/10/2009   No results found for this basename: LDLDIRECT   due for a check   a1c 5.4- well controlled hyperglycemia - due for a check   bp is stable today  No cp or palpitations or headaches or edema  No side effects to medicines  BP Readings from Last 3 Encounters:  12/08/12 122/86  09/17/11 130/80  10/23/10 142/80     Hypothyroidism  Pt has no clinical changes No change in energy level/ hair or skin/ edema and no tremor Lab Results  Component Value Date   TSH 4.53 09/10/2011    Falls- none at all   Mood --always in a good mood- fairly happy-no problems   Patient Active Problem List  Diagnosis   . HYPOTHYROIDISM  . LOW HDL  . GOUT  . ERECTILE DYSFUNCTION  . HYPERTENSION  . BENIGN PROSTATIC HYPERTROPHY  . HYPERGLYCEMIA  . Prostate cancer screening  . Tinea versicolor  . Personal history of colonic polyps   Past Medical History  Diagnosis Date  . Gout   . Hypertension   . Hypothyroidism   . BPH (benign prostatic hypertrophy)   . History of shingles    No past surgical history on file. History  Substance Use Topics  . Smoking status: Former Smoker    Quit date: 10/13/1968  . Smokeless tobacco: Not on file  . Alcohol Use: No     Comment: no alcohol in 20 years   Family History  Problem Relation Age of Onset  . Heart disease Mother     CABG  . Heart disease Father     CAD  . Cancer Father     lung CA  . Cancer Brother     lung CA   No Known Allergies No current outpatient prescriptions on file prior to visit.   No current facility-administered medications on file prior to visit.    Review of Systems Review of Systems  Constitutional:  Negative for fever, appetite change, fatigue and unexpected weight change.  Eyes: Negative for pain and visual disturbance.  Respiratory: Negative for cough and shortness of breath.   Cardiovascular: Negative for cp or palpitations    Gastrointestinal: Negative for nausea, diarrhea and constipation.  Genitourinary: Negative for urgency and frequency.  Skin: Negative for pallor or rash   Neurological: Negative for weakness, light-headedness, numbness and headaches.  Hematological: Negative for adenopathy. Does not bruise/bleed easily.  Psychiatric/Behavioral: Negative for dysphoric mood. The patient is not nervous/anxious.         Objective:   Physical Exam  Constitutional: He appears well-developed and well-nourished. No distress.  HENT:  Head: Normocephalic and atraumatic.  Right Ear: External ear normal.  Left Ear: External ear normal.  Nose: Nose normal.  Mouth/Throat: Oropharynx is clear and moist.  Scant  cerumen noted   Eyes: Conjunctivae and EOM are normal. Pupils are equal, round, and reactive to light. Right eye exhibits no discharge. Left eye exhibits no discharge. No scleral icterus.  Neck: Normal range of motion. Neck supple. No JVD present. Carotid bruit is not present. No thyromegaly present.  Cardiovascular: Normal rate, regular rhythm, normal heart sounds and intact distal pulses.  Exam reveals no gallop.   Pulmonary/Chest: Effort normal and breath sounds normal. No respiratory distress. He has no wheezes. He exhibits no tenderness.  Abdominal: Soft. He exhibits no distension, no abdominal bruit and no mass. There is no tenderness.  Genitourinary: Rectum normal. Rectal exam shows no mass and no tenderness. Prostate is enlarged. Prostate is not tender.  Prostate is mildly enl/ smooth/ symmetric firm and nt  Musculoskeletal: He exhibits no edema and no tenderness.  Lymphadenopathy:    He has no cervical adenopathy.  Neurological: He is alert. He has normal reflexes. No cranial nerve deficit. He exhibits normal muscle tone. Coordination normal.  Skin: Skin is warm and dry. No rash noted. No erythema. No pallor.  Psychiatric: He has a normal mood and affect.          Assessment & Plan:

## 2012-12-08 NOTE — Patient Instructions (Addendum)
If you are interested in a shingles/zoster vaccine - call your insurance to check on coverage,( you should not get it within 1 month of other vaccines) , then call us for a prescription  for it to take to a pharmacy that gives the shot , or make a nurse visit to get it here depending on your coverage Take care of yourself with healthy diet and exercise Labs today  Please call [facility] and see if they recommend any further colonoscopies

## 2012-12-09 ENCOUNTER — Telehealth: Payer: Self-pay | Admitting: Family Medicine

## 2012-12-09 MED ORDER — LEVOTHYROXINE SODIUM 50 MCG PO TABS
50.0000 ug | ORAL_TABLET | Freq: Every day | ORAL | Status: DC
Start: 2012-12-09 — End: 2012-12-13

## 2012-12-09 NOTE — Assessment & Plan Note (Signed)
No change in symptoms or exam  Does not feel he needs medication at this time psa today with labs

## 2012-12-09 NOTE — Assessment & Plan Note (Addendum)
bp in fair control at this time  No changes needed  Disc lifstyle change with low sodium diet and exercise   Labs ordered

## 2012-12-09 NOTE — Assessment & Plan Note (Signed)
Controlled with diet Disc low sat fat diet in detail and need for exercise  a1c today and update

## 2012-12-09 NOTE — Telephone Encounter (Signed)
Labs are overall stable - but tsh is up indicating that we need to increase his thyroid med dose If any missed does however- let me know because this could be why  Please call in med Re check tsh in 6 weeks please

## 2012-12-09 NOTE — Assessment & Plan Note (Signed)
GI office called and he is due for colonosc due to hx of polyps Referral was done for this

## 2012-12-09 NOTE — Assessment & Plan Note (Signed)
No problems at all lately  Allopurinol has controlled this well

## 2012-12-09 NOTE — Assessment & Plan Note (Signed)
No new symptoms  tsh today and update

## 2012-12-13 ENCOUNTER — Telehealth: Payer: Self-pay | Admitting: *Deleted

## 2012-12-13 MED ORDER — LEVOTHYROXINE SODIUM 25 MCG PO TABS: 25.0000 ug | ORAL_TABLET | Freq: Every day | ORAL | Status: DC

## 2012-12-13 NOTE — Telephone Encounter (Signed)
Thanks- in that case- go back to old dose - and change back on med list- can have a year supply and re check tsh in 6 weeks- thanks

## 2012-12-13 NOTE — Telephone Encounter (Signed)
Pt did let me know he missed 3-4 days of medication before his appt, he ran out and just waited until his appt to get refill, wanted to check with you to see what to do before I sent in new Rx

## 2012-12-13 NOTE — Telephone Encounter (Signed)
Opened in error

## 2012-12-13 NOTE — Telephone Encounter (Signed)
Pt notified to stay on old dose of meds and Rx sent in to pharmacy and lab appt scheduled for 01/24/13

## 2012-12-22 DIAGNOSIS — L738 Other specified follicular disorders: Secondary | ICD-10-CM | POA: Diagnosis not present

## 2012-12-22 DIAGNOSIS — L570 Actinic keratosis: Secondary | ICD-10-CM | POA: Diagnosis not present

## 2013-01-06 ENCOUNTER — Ambulatory Visit (AMBULATORY_SURGERY_CENTER): Payer: Medicare Other

## 2013-01-06 VITALS — Ht 72.0 in | Wt 225.8 lb

## 2013-01-06 DIAGNOSIS — Z1211 Encounter for screening for malignant neoplasm of colon: Secondary | ICD-10-CM

## 2013-01-06 DIAGNOSIS — Z86010 Personal history of colonic polyps: Secondary | ICD-10-CM

## 2013-01-06 MED ORDER — MOVIPREP 100 G PO SOLR: ORAL | Status: DC

## 2013-01-06 NOTE — Progress Notes (Signed)
Pt and his wife Myriam Jacobson) came into the office today for the pre-visit prior to the colonoscopy with Dr Russella Dar on 01/20/13.Pt does have problems with seeing certain things and reading. Myriam Jacobson (wife) will help with paperwork at his appointment.

## 2013-01-12 DIAGNOSIS — H353190 Nonexudative age-related macular degeneration, unspecified eye, stage unspecified: Secondary | ICD-10-CM | POA: Diagnosis not present

## 2013-01-12 DIAGNOSIS — H43819 Vitreous degeneration, unspecified eye: Secondary | ICD-10-CM | POA: Diagnosis not present

## 2013-01-20 ENCOUNTER — Ambulatory Visit (AMBULATORY_SURGERY_CENTER): Payer: Medicare Other | Admitting: Gastroenterology

## 2013-01-20 ENCOUNTER — Encounter: Payer: Self-pay | Admitting: Gastroenterology

## 2013-01-20 VITALS — BP 123/77 | HR 54 | Temp 95.7°F | Resp 14 | Ht 72.0 in | Wt 225.0 lb

## 2013-01-20 DIAGNOSIS — Z1211 Encounter for screening for malignant neoplasm of colon: Secondary | ICD-10-CM

## 2013-01-20 DIAGNOSIS — Z86010 Personal history of colonic polyps: Secondary | ICD-10-CM

## 2013-01-20 DIAGNOSIS — D126 Benign neoplasm of colon, unspecified: Secondary | ICD-10-CM

## 2013-01-20 DIAGNOSIS — E039 Hypothyroidism, unspecified: Secondary | ICD-10-CM | POA: Diagnosis not present

## 2013-01-20 DIAGNOSIS — I10 Essential (primary) hypertension: Secondary | ICD-10-CM | POA: Diagnosis not present

## 2013-01-20 MED ORDER — SODIUM CHLORIDE 0.9 % IV SOLN: 500.0000 mL | INTRAVENOUS | Status: DC

## 2013-01-20 NOTE — Progress Notes (Signed)
Patient did not experience any of the following events: a burn prior to discharge; a fall within the facility; wrong site/side/patient/procedure/implant event; or a hospital transfer or hospital admission upon discharge from the facility. (G8907) Patient did not have preoperative order for IV antibiotic SSI prophylaxis. (G8918)  

## 2013-01-20 NOTE — Progress Notes (Signed)
Called to room to assist during endoscopic procedure.  Patient ID and intended procedure confirmed with present staff. Received instructions for my participation in the procedure from the performing physician. ewm 

## 2013-01-20 NOTE — Op Note (Signed)
[facility] [address] [place], [address]   COLONOSCOPY PROCEDURE REPORT  PATIENT: Jamie Byrd, Jamie Byrd  MR#: [id number] BIRTHDATE: May 12, 1934 , 78  yrs. old GENDER: Male ENDOSCOPIST: Meryl Dare, MD, [facility] PROCEDURE DATE:  01/20/2013 PROCEDURE:   Colonoscopy with snare polypectomy ASA CLASS:   Class II INDICATIONS:Patient's personal history of adenomatous colon polyps: TVA in 2004. MEDICATIONS: MAC sedation, administered by CRNA and propofol (Diprivan) 180mg  IV DESCRIPTION OF PROCEDURE:   After the risks benefits and alternatives of the procedure were thoroughly explained, informed consent was obtained.  A digital rectal exam revealed no abnormalities of the rectum.   The LB [id number]  endoscope was introduced through the anus and advanced to the cecum, which was identified by both the appendix and ileocecal valve. No adverse events experienced.   The quality of the prep was good, using MoviPrep  The instrument was then slowly withdrawn as the colon was fully examined.  COLON FINDINGS: A sessile polyp measuring 5 mm in size was found in the descending colon.  A polypectomy was performed.  The resection was complete and the polyp tissue was completely retrieved.   The colon was otherwise normal.  There was no diverticulosis, inflammation, polyps or cancers unless previously stated. Retroflexed views revealed small internal hemorrhoids. The time to cecum=4 minutes 17 seconds.  Withdrawal time=10 minutes 06 seconds. The scope was withdrawn and the procedure completed.  COMPLICATIONS: There were no complications.  ENDOSCOPIC IMPRESSION: 1.   Sessile polyp measuring 5 mm in size was found in the descending colon; polypectomy was performed 2.   Small internal hemorrhoids  RECOMMENDATIONS: 1.  Await pathology results 2.  High fiber diet with liberal fluid intake. 3.  Given your age, you will not need another colonoscopy for colon cancer screening  or polyp surveillance.  These types of tests usually stop around the age 46.   eSigned:  Meryl Dare, MD, [facility] 01/20/2013 10:06 AM

## 2013-01-20 NOTE — Patient Instructions (Addendum)
Discharge instructions given with verbal understanding. Handout on polyps. Resume previous medications. YOU HAD AN ENDOSCOPIC PROCEDURE TODAY AT THE [facility]: Refer to the procedure report that was given to you for any specific questions about what was found during the examination.  If the procedure report does not answer your questions, please call your gastroenterologist to clarify.  If you requested that your care partner not be given the details of your procedure findings, then the procedure report has been included in a sealed envelope for you to review at your convenience later.  YOU SHOULD EXPECT: Some feelings of bloating in the abdomen. Passage of more gas than usual.  Walking can help get rid of the air that was put into your GI tract during the procedure and reduce the bloating. If you had a lower endoscopy (such as a colonoscopy or flexible sigmoidoscopy) you may notice spotting of blood in your stool or on the toilet paper. If you underwent a bowel prep for your procedure, then you may not have a normal bowel movement for a few days.  DIET: Your first meal following the procedure should be a light meal and then it is ok to progress to your normal diet.  A half-sandwich or bowl of soup is an example of a good first meal.  Heavy or fried foods are harder to digest and may make you feel nauseous or bloated.  Likewise meals heavy in dairy and vegetables can cause extra gas to form and this can also increase the bloating.  Drink plenty of fluids but you should avoid alcoholic beverages for 24 hours.  ACTIVITY: Your care partner should take you home directly after the procedure.  You should plan to take it easy, moving slowly for the rest of the day.  You can resume normal activity the day after the procedure however you should NOT DRIVE or use heavy machinery for 24 hours (because of the sedation medicines used during the test).    SYMPTOMS TO REPORT IMMEDIATELY: A  gastroenterologist can be reached at any hour.  During normal business hours, 8:30 AM to 5:00 PM Monday through Friday, call [phone].  After hours and on weekends, please call the GI answering service at [phone] who will take a message and have the physician on call contact you.   Following lower endoscopy (colonoscopy or flexible sigmoidoscopy):  Excessive amounts of blood in the stool  Significant tenderness or worsening of abdominal pains  Swelling of the abdomen that is new, acute  Fever of 100F or higher  FOLLOW UP: If any biopsies were taken you will be contacted by phone or by letter within the next 1-3 weeks.  Call your gastroenterologist if you have not heard about the biopsies in 3 weeks.  Our staff will call the home number listed on your records the next business day following your procedure to check on you and address any questions or concerns that you may have at that time regarding the information given to you following your procedure. This is a courtesy call and so if there is no answer at the home number and we have not heard from you through the emergency physician on call, we will assume that you have returned to your regular daily activities without incident.  SIGNATURES/CONFIDENTIALITY: You and/or your care partner have signed paperwork which will be entered into your electronic medical record.  These signatures attest to the fact that that the information above on your After Visit Summary has been   reviewed and is understood.  Full responsibility of the confidentiality of this discharge information lies with you and/or your care-partner. 

## 2013-01-21 ENCOUNTER — Telehealth: Payer: Self-pay | Admitting: *Deleted

## 2013-01-21 NOTE — Telephone Encounter (Signed)
  Follow up Call-  Call back number 01/20/2013  Post procedure Call Back phone  # [phone]  Permission to leave phone message Yes     Patient questions:  Do you have a fever, pain , or abdominal swelling? no Pain Score  0 *  Have you tolerated food without any problems? yes  Have you been able to return to your normal activities? yes  Do you have any questions about your discharge instructions: Diet   no Medications  no Follow up visit  no  Do you have questions or concerns about your Care? no  Actions: * If pain score is 4 or above: No action needed, pain <4.

## 2013-01-24 ENCOUNTER — Other Ambulatory Visit (INDEPENDENT_AMBULATORY_CARE_PROVIDER_SITE_OTHER): Payer: Medicare Other

## 2013-01-24 DIAGNOSIS — E039 Hypothyroidism, unspecified: Secondary | ICD-10-CM | POA: Diagnosis not present

## 2013-01-25 ENCOUNTER — Encounter: Payer: Self-pay | Admitting: Gastroenterology

## 2013-01-25 ENCOUNTER — Encounter: Payer: Self-pay | Admitting: *Deleted

## 2013-01-27 DIAGNOSIS — H353190 Nonexudative age-related macular degeneration, unspecified eye, stage unspecified: Secondary | ICD-10-CM | POA: Diagnosis not present

## 2013-01-27 DIAGNOSIS — Z961 Presence of intraocular lens: Secondary | ICD-10-CM | POA: Diagnosis not present

## 2013-05-09 ENCOUNTER — Other Ambulatory Visit: Payer: Self-pay | Admitting: Dermatology

## 2013-05-09 DIAGNOSIS — L909 Atrophic disorder of skin, unspecified: Secondary | ICD-10-CM | POA: Diagnosis not present

## 2013-05-09 DIAGNOSIS — C44319 Basal cell carcinoma of skin of other parts of face: Secondary | ICD-10-CM | POA: Diagnosis not present

## 2013-05-09 DIAGNOSIS — D485 Neoplasm of uncertain behavior of skin: Secondary | ICD-10-CM | POA: Diagnosis not present

## 2013-05-09 DIAGNOSIS — L723 Sebaceous cyst: Secondary | ICD-10-CM | POA: Diagnosis not present

## 2013-05-09 DIAGNOSIS — L570 Actinic keratosis: Secondary | ICD-10-CM | POA: Diagnosis not present

## 2013-05-09 DIAGNOSIS — C4441 Basal cell carcinoma of skin of scalp and neck: Secondary | ICD-10-CM | POA: Diagnosis not present

## 2013-05-09 DIAGNOSIS — Z85828 Personal history of other malignant neoplasm of skin: Secondary | ICD-10-CM | POA: Diagnosis not present

## 2013-05-09 DIAGNOSIS — L821 Other seborrheic keratosis: Secondary | ICD-10-CM | POA: Diagnosis not present

## 2013-05-09 DIAGNOSIS — C44611 Basal cell carcinoma of skin of unspecified upper limb, including shoulder: Secondary | ICD-10-CM | POA: Diagnosis not present

## 2013-05-09 DIAGNOSIS — L219 Seborrheic dermatitis, unspecified: Secondary | ICD-10-CM | POA: Diagnosis not present

## 2013-05-19 DIAGNOSIS — Z85828 Personal history of other malignant neoplasm of skin: Secondary | ICD-10-CM | POA: Diagnosis not present

## 2013-05-19 DIAGNOSIS — C44319 Basal cell carcinoma of skin of other parts of face: Secondary | ICD-10-CM | POA: Diagnosis not present

## 2013-07-07 ENCOUNTER — Ambulatory Visit (INDEPENDENT_AMBULATORY_CARE_PROVIDER_SITE_OTHER): Payer: Medicare Other

## 2013-07-07 DIAGNOSIS — Z23 Encounter for immunization: Secondary | ICD-10-CM | POA: Diagnosis not present

## 2013-07-13 DIAGNOSIS — H35429 Microcystoid degeneration of retina, unspecified eye: Secondary | ICD-10-CM | POA: Diagnosis not present

## 2013-07-13 DIAGNOSIS — H353190 Nonexudative age-related macular degeneration, unspecified eye, stage unspecified: Secondary | ICD-10-CM | POA: Diagnosis not present

## 2013-07-13 DIAGNOSIS — H43819 Vitreous degeneration, unspecified eye: Secondary | ICD-10-CM | POA: Diagnosis not present

## 2013-11-07 ENCOUNTER — Other Ambulatory Visit: Payer: Self-pay | Admitting: Dermatology

## 2013-11-07 DIAGNOSIS — Z85828 Personal history of other malignant neoplasm of skin: Secondary | ICD-10-CM | POA: Diagnosis not present

## 2013-11-07 DIAGNOSIS — D1801 Hemangioma of skin and subcutaneous tissue: Secondary | ICD-10-CM | POA: Diagnosis not present

## 2013-11-07 DIAGNOSIS — L820 Inflamed seborrheic keratosis: Secondary | ICD-10-CM | POA: Diagnosis not present

## 2013-11-07 DIAGNOSIS — L570 Actinic keratosis: Secondary | ICD-10-CM | POA: Diagnosis not present

## 2013-11-07 DIAGNOSIS — D485 Neoplasm of uncertain behavior of skin: Secondary | ICD-10-CM | POA: Diagnosis not present

## 2013-11-07 DIAGNOSIS — L819 Disorder of pigmentation, unspecified: Secondary | ICD-10-CM | POA: Diagnosis not present

## 2013-11-07 DIAGNOSIS — L821 Other seborrheic keratosis: Secondary | ICD-10-CM | POA: Diagnosis not present

## 2013-11-22 ENCOUNTER — Other Ambulatory Visit: Payer: Self-pay | Admitting: Family Medicine

## 2013-11-22 MED ORDER — LEVOTHYROXINE SODIUM 25 MCG PO TABS: 25.0000 ug | ORAL_TABLET | Freq: Every day | ORAL | Status: DC

## 2013-12-09 ENCOUNTER — Encounter: Payer: Self-pay | Admitting: Family Medicine

## 2013-12-09 ENCOUNTER — Ambulatory Visit (INDEPENDENT_AMBULATORY_CARE_PROVIDER_SITE_OTHER): Payer: Medicare Other | Admitting: Family Medicine

## 2013-12-09 VITALS — BP 138/85 | HR 61 | Temp 97.7°F | Ht 71.25 in | Wt 227.8 lb

## 2013-12-09 DIAGNOSIS — I10 Essential (primary) hypertension: Secondary | ICD-10-CM

## 2013-12-09 DIAGNOSIS — Z125 Encounter for screening for malignant neoplasm of prostate: Secondary | ICD-10-CM

## 2013-12-09 DIAGNOSIS — E039 Hypothyroidism, unspecified: Secondary | ICD-10-CM | POA: Diagnosis not present

## 2013-12-09 DIAGNOSIS — H9110 Presbycusis, unspecified ear: Secondary | ICD-10-CM

## 2013-12-09 DIAGNOSIS — M109 Gout, unspecified: Secondary | ICD-10-CM | POA: Diagnosis not present

## 2013-12-09 DIAGNOSIS — R7309 Other abnormal glucose: Secondary | ICD-10-CM

## 2013-12-09 DIAGNOSIS — N400 Enlarged prostate without lower urinary tract symptoms: Secondary | ICD-10-CM

## 2013-12-09 DIAGNOSIS — Z0000 Encounter for general adult medical examination without abnormal findings: Secondary | ICD-10-CM

## 2013-12-09 LAB — COMPREHENSIVE METABOLIC PANEL
ALBUMIN: 4.5 g/dL (ref 3.5–5.2)
ALT: 17 U/L (ref 0–53)
AST: 22 U/L (ref 0–37)
Alkaline Phosphatase: 99 U/L (ref 39–117)
BUN: 13 mg/dL (ref 6–23)
CALCIUM: 9.8 mg/dL (ref 8.4–10.5)
CHLORIDE: 100 meq/L (ref 96–112)
CO2: 30 meq/L (ref 19–32)
Creatinine, Ser: 1 mg/dL (ref 0.4–1.5)
GFR: 81.66 mL/min (ref >60.00)
GLUCOSE: 95 mg/dL (ref 70–99)
Potassium: 4.7 mEq/L (ref 3.5–5.1)
SODIUM: 134 meq/L — AB (ref 135–145)
TOTAL PROTEIN: 7.3 g/dL (ref 6.0–8.3)
Total Bilirubin: 0.9 mg/dL (ref 0.3–1.2)

## 2013-12-09 LAB — LIPID PANEL
CHOLESTEROL: 149 mg/dL (ref 0–200)
HDL: 29.7 mg/dL — ABNORMAL LOW (ref >39.00)
LDL CALC: 96 mg/dL (ref 0–99)
Total CHOL/HDL Ratio: 5
Triglycerides: 119 mg/dL (ref 0.0–149.0)
VLDL: 23.8 mg/dL (ref 0.0–40.0)

## 2013-12-09 LAB — CBC WITH DIFFERENTIAL/PLATELET
BASOS ABS: 0.1 10*3/uL (ref 0.0–0.1)
Basophils Relative: 0.7 % (ref 0.0–3.0)
EOS PCT: 3 % (ref 0.0–5.0)
Eosinophils Absolute: 0.2 10*3/uL (ref 0.0–0.7)
HEMATOCRIT: 46.4 % (ref 39.0–52.0)
HEMOGLOBIN: 15.2 g/dL (ref 13.0–17.0)
LYMPHS PCT: 39.3 % (ref 12.0–46.0)
Lymphs Abs: 2.6 10*3/uL (ref 0.7–4.0)
MCHC: 32.7 g/dL (ref 30.0–36.0)
MCV: 97.6 fl (ref 78.0–100.0)
MONOS PCT: 8.1 % (ref 3.0–12.0)
Monocytes Absolute: 0.5 10*3/uL (ref 0.1–1.0)
Neutro Abs: 3.3 10*3/uL (ref 1.4–7.7)
Neutrophils Relative %: 48.9 % (ref 43.0–77.0)
PLATELETS: 240 10*3/uL (ref 150.0–400.0)
RBC: 4.75 Mil/uL (ref 4.22–5.81)
RDW: 13.9 % (ref 11.5–14.6)
WBC: 6.7 10*3/uL (ref 4.5–10.5)

## 2013-12-09 LAB — PSA: PSA: 0.52 ng/mL (ref 0.10–4.00)

## 2013-12-09 LAB — TSH: TSH: 6.54 u[IU]/mL — AB (ref 0.35–5.50)

## 2013-12-09 LAB — URIC ACID: URIC ACID, SERUM: 4.3 mg/dL (ref 4.0–7.8)

## 2013-12-09 LAB — HEMOGLOBIN A1C: Hgb A1c MFr Bld: 5.3 % (ref 4.6–6.5)

## 2013-12-09 MED ORDER — LISINOPRIL 20 MG PO TABS: 20.0000 mg | ORAL_TABLET | Freq: Every day | ORAL | Status: DC

## 2013-12-09 MED ORDER — ALLOPURINOL 100 MG PO TABS: 100.0000 mg | ORAL_TABLET | Freq: Every day | ORAL | Status: DC

## 2013-12-09 MED ORDER — LEVOTHYROXINE SODIUM 25 MCG PO TABS: 25.0000 ug | ORAL_TABLET | Freq: Every day | ORAL | Status: DC

## 2013-12-09 NOTE — Assessment & Plan Note (Signed)
No episodes at all  Check uric acid level on allopurinol today

## 2013-12-09 NOTE — Progress Notes (Signed)
Pre visit review using our clinic review tool, if applicable. No additional management support is needed unless otherwise documented below in the visit note. 

## 2013-12-09 NOTE — Assessment & Plan Note (Signed)
tsh today No clinical changes  

## 2013-12-09 NOTE — Progress Notes (Signed)
Subjective:    Patient ID: Jamie Byrd, male    DOB: [birth year], 80 y.o.   MRN: [id number]  HPI I have personally reviewed the Medicare Annual Wellness questionnaire and have noted 1. The patient's medical and social history 2. Their use of alcohol, tobacco or illicit drugs 3. Their current medications and supplements 4. The patient's functional ability including ADL's, fall risks, home safety risks and hearing or visual             impairment. 5. Diet and physical activities 6. Evidence for depression or mood disorders  The patients weight, height, BMI have been recorded in the chart and visual acuity is per eye clinic.  I have made referrals, counseling and provided education to the patient based review of the above and I have provided the pt with a written personalized care plan for preventive services.  Feels good -nothing new going on health wise   Will have his labs drawn today  See scanned forms.  Routine anticipatory guidance given to patient.  See health maintenance. Flu shot in 9/14 Shingles -has had shingles in the past but not vaccine - ? If his ins will cover it  PNA 12/12 up to date  Tetanus 9/14 up to date  Colonoscopy 4/14  Prostate cancer screening- has hx of BPH  Advance directive-does not have a living will at this time - given packet to work on that  Cognitive function addressed- see scanned forms- and if abnormal then additional documentation follows. - not as quick as he used to be but no major problems- takes care of own finances/ etc   PMH and SH reviewed  Meds, vitals, and allergies reviewed.   ROS: See HPI.  Otherwise negative.    Hypothyroid Due to tsh Feels fine - no wt loss or fatigue that is new - doing well overall   He has hearing loss - does not feel ready for hearing aides yet  Worse in crowded places   bp is up today (he did take his medicine today) - better on 2nd check BP: 138/85 mmHg   No cp or palpitations or headaches or  edema  No side effects to medicines  BP Readings from Last 3 Encounters:  12/09/13 150/94  01/20/13 123/77  12/08/12 122/86      Hx of gout-no episodes at all   Hx of bph Lab Results  Component Value Date   PSA 0.49 12/08/2012   PSA 0.55 09/10/2011   PSA 0.53 09/09/2010   no meds  No new problems- he urinates 3 times per night, but not frequent during the day  No change in that  No change in stream   Hx of hyperglycemia Lab Results  Component Value Date   HGBA1C 5.4 12/08/2012   is eating a fairly healthy diet  Not as much exercise in the cold weather   Patient Active Problem List   Diagnosis Date Noted  . Personal history of colonic polyps 12/08/2012  . Tinea versicolor 09/17/2011  . Prostate cancer screening 09/10/2011  . HYPERGLYCEMIA 09/25/2010  . LOW HDL 11/05/2009  . HYPOTHYROIDISM 07/28/2007  . GOUT 07/28/2007  . ERECTILE DYSFUNCTION 07/28/2007  . HYPERTENSION 07/28/2007  . BENIGN PROSTATIC HYPERTROPHY 07/28/2007   Past Medical History  Diagnosis Date  . Gout   . Hypertension   . Hypothyroidism   . BPH (benign prostatic hypertrophy)   . History of shingles   . Difficulty reading   . Difficulty seeing  Difficulty in seeing certain things and reading/ not legally blind!   Past Surgical History  Procedure Laterality Date  . Colonoscopy    . Polypectomy     History  Substance Use Topics  . Smoking status: Former Smoker    Quit date: 10/13/1968  . Smokeless tobacco: Never Used  . Alcohol Use: No     Comment: no alcohol in 20 years   Family History  Problem Relation Age of Onset  . Heart disease Mother     CABG  . Heart disease Father     CAD  . Cancer Father     lung CA  . Cancer Brother     lung CA   No Known Allergies Current Outpatient Prescriptions on File Prior to Visit  Medication Sig Dispense Refill  . allopurinol (ZYLOPRIM) 100 MG tablet Take 1 tablet (100 mg total) by mouth daily.  90 tablet  3  . levothyroxine (SYNTHROID,  LEVOTHROID) 25 MCG tablet Take 1 tablet (25 mcg total) by mouth daily.  90 tablet  0  . lisinopril (PRINIVIL,ZESTRIL) 20 MG tablet Take 1 tablet (20 mg total) by mouth daily.  90 tablet  3   No current facility-administered medications on file prior to visit.    Review of Systems Review of Systems  Constitutional: Negative for fever, appetite change, fatigue and unexpected weight change.  Eyes: Negative for pain and visual disturbance.  Respiratory: Negative for cough and shortness of breath.   Cardiovascular: Negative for cp or palpitations    Gastrointestinal: Negative for nausea, diarrhea and constipation.  Genitourinary: Negative for urgency and frequency.  Skin: Negative for pallor or rash   Neurological: Negative for weakness, light-headedness, numbness and headaches.  Hematological: Negative for adenopathy. Does not bruise/bleed easily.  Psychiatric/Behavioral: Negative for dysphoric mood. The patient is not nervous/anxious.         Objective:   Physical Exam  Constitutional: He is oriented to person, place, and time. He appears well-developed and well-nourished. No distress.  HENT:  Head: Normocephalic and atraumatic.  Right Ear: External ear normal.  Left Ear: External ear normal.  Nose: Nose normal.  Mouth/Throat: Oropharynx is clear and moist.  Moderately hard of hearing   Eyes: Conjunctivae and EOM are normal. Pupils are equal, round, and reactive to light. Right eye exhibits no discharge. Left eye exhibits no discharge. No scleral icterus.  Neck: Normal range of motion. Neck supple. No JVD present. Carotid bruit is not present. No thyromegaly present.  Cardiovascular: Normal rate, regular rhythm, normal heart sounds and intact distal pulses.  Exam reveals no gallop.   Pulmonary/Chest: Effort normal and breath sounds normal. No respiratory distress. He has no wheezes. He has no rales.  Abdominal: Soft. Bowel sounds are normal. He exhibits no distension, no abdominal  bruit and no mass. There is no tenderness.  Musculoskeletal: Normal range of motion. He exhibits no edema and no tenderness.  Lymphadenopathy:    He has no cervical adenopathy.  Neurological: He is alert and oriented to person, place, and time. He has normal reflexes. No cranial nerve deficit. He exhibits normal muscle tone. Coordination normal.  Skin: Skin is warm and dry. No rash noted. No erythema. No pallor.  Many SK baseline Per pt -had recent dermatology visit   Psychiatric: He has a normal mood and affect.          Assessment & Plan:

## 2013-12-09 NOTE — Patient Instructions (Signed)
If you are interested in a shingles/zoster vaccine - call your insurance to check on coverage,( you should not get it within 1 month of other vaccines) , then call us for a prescription  for it to take to a pharmacy that gives the shot , or make a nurse visit to get it here depending on your coverage Labs today  Think about working on an advanced directive / living will - I gave you a packet that outlines the process If you want a more formal hearing evaluation for hearing aides-let us know and we will refer you

## 2013-12-09 NOTE — Assessment & Plan Note (Signed)
a1c today Pt has been fairly well controlled with diet  Wt is up a few lb- adv to work on loosing it

## 2013-12-09 NOTE — Assessment & Plan Note (Signed)
Reviewed health habits including diet and exercise and skin cancer prevention Reviewed appropriate screening tests for age  Also reviewed health mt list, fam hx and immunization status , as well as social and family history   See HPI Labs today Adv to work on Insurance underwriter  He will check with ins re: zostavax coverage

## 2013-12-09 NOTE — Assessment & Plan Note (Signed)
Better on 2nd check today BP: 138/85 mmHg  bp in fair control at this time  No changes needed Disc lifstyle change with low sodium diet and exercise   Lab today

## 2013-12-09 NOTE — Assessment & Plan Note (Signed)
Pt is not ready for further eval or hearing aides at this time  He will let us know if he changes his mind

## 2013-12-09 NOTE — Assessment & Plan Note (Signed)
Stable overall with unchanged nocturia  Pt does not desire tx for this  psa today

## 2013-12-12 ENCOUNTER — Telehealth: Payer: Self-pay | Admitting: Family Medicine

## 2013-12-12 NOTE — Telephone Encounter (Signed)
Relevant patient education mailed to patient.  

## 2013-12-16 ENCOUNTER — Other Ambulatory Visit: Payer: Self-pay | Admitting: *Deleted

## 2013-12-16 MED ORDER — LEVOTHYROXINE SODIUM 50 MCG PO TABS: 50.0000 ug | ORAL_TABLET | Freq: Every day | ORAL | Status: DC

## 2013-12-16 NOTE — Telephone Encounter (Signed)
Due to lab results we increased pt's thyroid med. New Rx sent to pharmacy

## 2014-01-16 DIAGNOSIS — H43819 Vitreous degeneration, unspecified eye: Secondary | ICD-10-CM | POA: Diagnosis not present

## 2014-01-16 DIAGNOSIS — H353190 Nonexudative age-related macular degeneration, unspecified eye, stage unspecified: Secondary | ICD-10-CM | POA: Diagnosis not present

## 2014-01-25 ENCOUNTER — Other Ambulatory Visit: Payer: Self-pay | Admitting: Family Medicine

## 2014-01-25 DIAGNOSIS — E039 Hypothyroidism, unspecified: Secondary | ICD-10-CM

## 2014-01-31 ENCOUNTER — Other Ambulatory Visit (INDEPENDENT_AMBULATORY_CARE_PROVIDER_SITE_OTHER): Payer: Medicare Other

## 2014-01-31 DIAGNOSIS — E039 Hypothyroidism, unspecified: Secondary | ICD-10-CM | POA: Diagnosis not present

## 2014-01-31 LAB — TSH: TSH: 5.16 u[IU]/mL (ref 0.35–5.50)

## 2014-02-03 ENCOUNTER — Encounter: Payer: Self-pay | Admitting: *Deleted

## 2014-02-17 ENCOUNTER — Telehealth: Payer: Self-pay

## 2014-02-17 MED ORDER — COLCHICINE 0.6 MG PO TABS: 0.6000 mg | ORAL_TABLET | Freq: Two times a day (BID) | ORAL | Status: DC | PRN

## 2014-02-17 NOTE — Telephone Encounter (Signed)
What symptoms ? -itching? Peeling? -thanks

## 2014-02-17 NOTE — Telephone Encounter (Signed)
I don't know who made the mistake, the wife or triage, but Jamie Byrd states that his problem is with gout and not his scalp.  Patient says you told him that when he had another flare-up, to let you know and you could send in some med that may help.    Spoke with Dr. Glori Bickers on her way out of the office and she gave me the authority to send in Colchicine or Colcrys twice daily as needed.  Rx sent to [facility] on [address] in [place].  Note sent to Dr. Glori Bickers for signing.

## 2014-02-17 NOTE — Telephone Encounter (Signed)
Jamie Byrd left v/m; pt scalp is starting to flare up and pt thinks Dr Glori Bickers said if had problem with scalp again she would call in med. [employer].Please advise.

## 2014-04-18 ENCOUNTER — Other Ambulatory Visit: Payer: Self-pay | Admitting: Family Medicine

## 2014-05-08 ENCOUNTER — Other Ambulatory Visit: Payer: Self-pay | Admitting: Dermatology

## 2014-05-08 DIAGNOSIS — L821 Other seborrheic keratosis: Secondary | ICD-10-CM | POA: Diagnosis not present

## 2014-05-08 DIAGNOSIS — C44319 Basal cell carcinoma of skin of other parts of face: Secondary | ICD-10-CM | POA: Diagnosis not present

## 2014-05-08 DIAGNOSIS — Z85828 Personal history of other malignant neoplasm of skin: Secondary | ICD-10-CM | POA: Diagnosis not present

## 2014-05-08 DIAGNOSIS — D485 Neoplasm of uncertain behavior of skin: Secondary | ICD-10-CM | POA: Diagnosis not present

## 2014-05-08 DIAGNOSIS — D1801 Hemangioma of skin and subcutaneous tissue: Secondary | ICD-10-CM | POA: Diagnosis not present

## 2014-05-08 DIAGNOSIS — L820 Inflamed seborrheic keratosis: Secondary | ICD-10-CM | POA: Diagnosis not present

## 2014-05-08 DIAGNOSIS — L570 Actinic keratosis: Secondary | ICD-10-CM | POA: Diagnosis not present

## 2014-05-31 DIAGNOSIS — H353190 Nonexudative age-related macular degeneration, unspecified eye, stage unspecified: Secondary | ICD-10-CM | POA: Diagnosis not present

## 2014-06-06 DIAGNOSIS — H353190 Nonexudative age-related macular degeneration, unspecified eye, stage unspecified: Secondary | ICD-10-CM | POA: Diagnosis not present

## 2014-06-06 DIAGNOSIS — H43819 Vitreous degeneration, unspecified eye: Secondary | ICD-10-CM | POA: Diagnosis not present

## 2014-06-06 DIAGNOSIS — H35429 Microcystoid degeneration of retina, unspecified eye: Secondary | ICD-10-CM | POA: Diagnosis not present

## 2014-06-20 DIAGNOSIS — C44319 Basal cell carcinoma of skin of other parts of face: Secondary | ICD-10-CM | POA: Diagnosis not present

## 2014-06-20 DIAGNOSIS — Z85828 Personal history of other malignant neoplasm of skin: Secondary | ICD-10-CM | POA: Diagnosis not present

## 2014-07-19 ENCOUNTER — Ambulatory Visit (INDEPENDENT_AMBULATORY_CARE_PROVIDER_SITE_OTHER): Payer: Medicare Other

## 2014-07-19 DIAGNOSIS — Z23 Encounter for immunization: Secondary | ICD-10-CM | POA: Diagnosis not present

## 2014-09-20 ENCOUNTER — Other Ambulatory Visit: Payer: Self-pay | Admitting: Family Medicine

## 2014-10-23 DIAGNOSIS — M720 Palmar fascial fibromatosis [Dupuytren]: Secondary | ICD-10-CM | POA: Diagnosis not present

## 2014-10-23 DIAGNOSIS — R2231 Localized swelling, mass and lump, right upper limb: Secondary | ICD-10-CM | POA: Diagnosis not present

## 2014-11-07 DIAGNOSIS — L821 Other seborrheic keratosis: Secondary | ICD-10-CM | POA: Diagnosis not present

## 2014-11-07 DIAGNOSIS — L853 Xerosis cutis: Secondary | ICD-10-CM | POA: Diagnosis not present

## 2014-11-07 DIAGNOSIS — L570 Actinic keratosis: Secondary | ICD-10-CM | POA: Diagnosis not present

## 2014-11-07 DIAGNOSIS — L812 Freckles: Secondary | ICD-10-CM | POA: Diagnosis not present

## 2014-11-07 DIAGNOSIS — Z85828 Personal history of other malignant neoplasm of skin: Secondary | ICD-10-CM | POA: Diagnosis not present

## 2014-11-17 ENCOUNTER — Ambulatory Visit (INDEPENDENT_AMBULATORY_CARE_PROVIDER_SITE_OTHER): Payer: Medicare Other | Admitting: Internal Medicine

## 2014-11-17 ENCOUNTER — Encounter: Payer: Self-pay | Admitting: Internal Medicine

## 2014-11-17 VITALS — BP 130/80 | HR 82 | Temp 99.5°F | Wt 223.0 lb

## 2014-11-17 DIAGNOSIS — J029 Acute pharyngitis, unspecified: Secondary | ICD-10-CM | POA: Diagnosis not present

## 2014-11-17 NOTE — Assessment & Plan Note (Signed)
Clearly not strep Discussed viral etiology Discussed supportive care

## 2014-11-17 NOTE — Patient Instructions (Signed)
You can try 2 acetaminophen (tylenol 325mg ) up to 4 times a day for the throat pain.  Pharyngitis Pharyngitis is redness, pain, and swelling (inflammation) of your pharynx.  CAUSES  Pharyngitis is usually caused by infection. Most of the time, these infections are from viruses (viral) and are part of a cold. However, sometimes pharyngitis is caused by bacteria (bacterial). Pharyngitis can also be caused by allergies. Viral pharyngitis may be spread from person to person by coughing, sneezing, and personal items or utensils (cups, forks, spoons, toothbrushes). Bacterial pharyngitis may be spread from person to person by more intimate contact, such as kissing.  SIGNS AND SYMPTOMS  Symptoms of pharyngitis include:   Sore throat.   Tiredness (fatigue).   Low-grade fever.   Headache.  Joint pain and muscle aches.  Skin rashes.  Swollen lymph nodes.  Plaque-like film on throat or tonsils (often seen with bacterial pharyngitis). DIAGNOSIS  Your health care provider will ask you questions about your illness and your symptoms. Your medical history, along with a physical exam, is often all that is needed to diagnose pharyngitis. Sometimes, a rapid strep test is done. Other lab tests may also be done, depending on the suspected cause.  TREATMENT  Viral pharyngitis will usually get better in 3-4 days without the use of medicine. Bacterial pharyngitis is treated with medicines that kill germs (antibiotics).  HOME CARE INSTRUCTIONS   Drink enough water and fluids to keep your urine clear or pale yellow.   Only take over-the-counter or prescription medicines as directed by your health care provider:   If you are prescribed antibiotics, make sure you finish them even if you start to feel better.   Do not take aspirin.   Get lots of rest.   Gargle with 8 oz of salt water ( tsp of salt per 1 qt of water) as often as every 1-2 hours to soothe your throat.   Throat lozenges (if you  are not at risk for choking) or sprays may be used to soothe your throat. SEEK MEDICAL CARE IF:   You have large, tender lumps in your neck.  You have a rash.  You cough up green, yellow-brown, or bloody spit. SEEK IMMEDIATE MEDICAL CARE IF:   Your neck becomes stiff.  You drool or are unable to swallow liquids.  You vomit or are unable to keep medicines or liquids down.  You have severe pain that does not go away with the use of recommended medicines.  You have trouble breathing (not caused by a stuffy nose). MAKE SURE YOU:   Understand these instructions.  Will watch your condition.  Will get help right away if you are not doing well or get worse. Document Released: 09/29/2005 Document Revised: 07/20/2013 Document Reviewed: 06/06/2013 [facility] Patient Information 2015 [place], [place]. This information is not intended to replace advice given to you by your health care provider. Make sure you discuss any questions you have with your health care provider.

## 2014-11-17 NOTE — Progress Notes (Signed)
   Subjective:    Patient ID: Jamie Byrd, male    DOB: 05/16/1934, 81 y.o.   MRN: [id number]  HPI Here with wife He has had a sore throat-- started 3 days ago Hard to talk or swallow Worse today Felt feverish at home No chills or sweats  No cough No SOB No clear swollen glands Some right ear pain---stopped up  Hasn't tried any meds other than tylenol yesterday--- may have helped some  Current Outpatient Prescriptions on File Prior to Visit  Medication Sig Dispense Refill  . allopurinol (ZYLOPRIM) 100 MG tablet Take 1 tablet (100 mg total) by mouth daily. 90 tablet 3  . colchicine 0.6 MG tablet Take 1 tablet (0.6 mg total) by mouth 2 (two) times daily as needed. 30 tablet 0  . levothyroxine (SYNTHROID, LEVOTHROID) 50 MCG tablet TAKE ONE TABLET BY MOUTH EVERY DAY 90 tablet 0  . lisinopril (PRINIVIL,ZESTRIL) 20 MG tablet Take 1 tablet (20 mg total) by mouth daily. 90 tablet 3   No current facility-administered medications on file prior to visit.    No Known Allergies  Past Medical History  Diagnosis Date  . Gout   . Hypertension   . Hypothyroidism   . BPH (benign prostatic hypertrophy)   . History of shingles   . Difficulty reading   . Difficulty seeing     Difficulty in seeing certain things and reading/ not legally blind!  . Macular degeneration     Past Surgical History  Procedure Laterality Date  . Colonoscopy    . Polypectomy      Family History  Problem Relation Age of Onset  . Heart disease Mother     CABG  . Heart disease Father     CAD  . Cancer Father     lung CA  . Cancer Brother     lung CA    History   Social History  . Marital Status: Married    Spouse Name: N/A    Number of Children: N/A  . Years of Education: N/A   Occupational History  . Not on file.   Social History Main Topics  . Smoking status: Former Smoker    Quit date: 10/13/1968  . Smokeless tobacco: Never Used  . Alcohol Use: No     Comment: no alcohol in 20  years  . Drug Use: No  . Sexual Activity: Not on file   Other Topics Concern  . Not on file   Social History Narrative   Review of Systems No rash No N/V/diarrhea Appetite is okay No ill exposures No myalgias    Objective:   Physical Exam  Constitutional: He appears well-developed and well-nourished. No distress.  HENT:  No sinus tenderness TMs normal Mild nasal congestion Pharynx is slightly red but not really inflamed No exudate  Neck: Normal range of motion. Neck supple.  Pulmonary/Chest: Effort normal and breath sounds normal. No respiratory distress. He has no wheezes. He has no rales.  Lymphadenopathy:    He has no cervical adenopathy.  Skin: No rash noted.          Assessment & Plan:

## 2014-11-17 NOTE — Progress Notes (Signed)
Pre visit review using our clinic review tool, if applicable. No additional management support is needed unless otherwise documented below in the visit note. 

## 2014-12-05 DIAGNOSIS — H3531 Nonexudative age-related macular degeneration: Secondary | ICD-10-CM | POA: Diagnosis not present

## 2014-12-11 ENCOUNTER — Ambulatory Visit (INDEPENDENT_AMBULATORY_CARE_PROVIDER_SITE_OTHER): Payer: Medicare Other | Admitting: Family Medicine

## 2014-12-11 ENCOUNTER — Encounter: Payer: Self-pay | Admitting: Family Medicine

## 2014-12-11 VITALS — BP 132/72 | HR 60 | Temp 97.4°F | Ht 71.0 in | Wt 224.2 lb

## 2014-12-11 DIAGNOSIS — N400 Enlarged prostate without lower urinary tract symptoms: Secondary | ICD-10-CM | POA: Diagnosis not present

## 2014-12-11 DIAGNOSIS — E039 Hypothyroidism, unspecified: Secondary | ICD-10-CM | POA: Diagnosis not present

## 2014-12-11 DIAGNOSIS — Z0000 Encounter for general adult medical examination without abnormal findings: Secondary | ICD-10-CM | POA: Diagnosis not present

## 2014-12-11 DIAGNOSIS — M1A9XX0 Chronic gout, unspecified, without tophus (tophi): Secondary | ICD-10-CM | POA: Diagnosis not present

## 2014-12-11 DIAGNOSIS — I10 Essential (primary) hypertension: Secondary | ICD-10-CM | POA: Diagnosis not present

## 2014-12-11 DIAGNOSIS — R739 Hyperglycemia, unspecified: Secondary | ICD-10-CM

## 2014-12-11 DIAGNOSIS — E786 Lipoprotein deficiency: Secondary | ICD-10-CM

## 2014-12-11 DIAGNOSIS — Z23 Encounter for immunization: Secondary | ICD-10-CM

## 2014-12-11 LAB — CBC WITH DIFFERENTIAL/PLATELET
BASOS ABS: 0 10*3/uL (ref 0.0–0.1)
Basophils Relative: 0.8 % (ref 0.0–3.0)
Eosinophils Absolute: 0.2 10*3/uL (ref 0.0–0.7)
Eosinophils Relative: 3.4 % (ref 0.0–5.0)
HCT: 45.4 % (ref 39.0–52.0)
Hemoglobin: 15.4 g/dL (ref 13.0–17.0)
Lymphocytes Relative: 35.2 % (ref 12.0–46.0)
Lymphs Abs: 2.1 10*3/uL (ref 0.7–4.0)
MCHC: 33.8 g/dL (ref 30.0–36.0)
MCV: 94.7 fl (ref 78.0–100.0)
Monocytes Absolute: 0.5 10*3/uL (ref 0.1–1.0)
Monocytes Relative: 9.3 % (ref 3.0–12.0)
NEUTROS ABS: 3 10*3/uL (ref 1.4–7.7)
Neutrophils Relative %: 51.3 % (ref 43.0–77.0)
Platelets: 235 10*3/uL (ref 150.0–400.0)
RBC: 4.79 Mil/uL (ref 4.22–5.81)
RDW: 14 % (ref 11.5–15.5)
WBC: 5.9 10*3/uL (ref 4.0–10.5)

## 2014-12-11 LAB — COMPREHENSIVE METABOLIC PANEL
ALT: 13 U/L (ref 0–53)
AST: 20 U/L (ref 0–37)
Albumin: 4.5 g/dL (ref 3.5–5.2)
Alkaline Phosphatase: 103 U/L (ref 39–117)
BILIRUBIN TOTAL: 0.8 mg/dL (ref 0.2–1.2)
BUN: 12 mg/dL (ref 6–23)
CO2: 29 meq/L (ref 19–32)
CREATININE: 0.9 mg/dL (ref 0.40–1.50)
Calcium: 9.8 mg/dL (ref 8.4–10.5)
Chloride: 100 mEq/L (ref 96–112)
GFR: 86.69 mL/min (ref >60.00)
Glucose, Bld: 94 mg/dL (ref 70–99)
Potassium: 4.5 mEq/L (ref 3.5–5.1)
Sodium: 136 mEq/L (ref 135–145)
Total Protein: 7.2 g/dL (ref 6.0–8.3)

## 2014-12-11 LAB — PSA: PSA: 0.55 ng/mL (ref 0.10–4.00)

## 2014-12-11 LAB — HEMOGLOBIN A1C: Hgb A1c MFr Bld: 5.4 % (ref 4.6–6.5)

## 2014-12-11 LAB — TSH: TSH: 4.05 u[IU]/mL (ref 0.35–4.50)

## 2014-12-11 MED ORDER — LEVOTHYROXINE SODIUM 50 MCG PO TABS: 50.0000 ug | ORAL_TABLET | Freq: Every day | ORAL | Status: DC

## 2014-12-11 MED ORDER — ALLOPURINOL 100 MG PO TABS: 100.0000 mg | ORAL_TABLET | Freq: Every day | ORAL | Status: DC

## 2014-12-11 MED ORDER — LISINOPRIL 20 MG PO TABS: 20.0000 mg | ORAL_TABLET | Freq: Every day | ORAL | Status: DC

## 2014-12-11 NOTE — Patient Instructions (Signed)
Labs today  prevnar vaccine today  If you are interested in a shingles/zoster vaccine - call your insurance to check on coverage,( you should not get it within 1 month of other vaccines) , then call us for a prescription  for it to take to a pharmacy that gives the shot , or make a nurse visit to get it here depending on your coverage   Please work on an advanced directive (living will and power of attorney)- I gave you a blue packet explaining the process  Eat a healthy diet and stay active

## 2014-12-11 NOTE — Assessment & Plan Note (Signed)
Reviewed health habits including diet and exercise and skin cancer prevention Reviewed appropriate screening tests for age  Also reviewed health mt list, fam hx and immunization status , as well as social and family history   See HPI Continues opthy exams for macular deg Does not want formal hearing exam yet prevnar today Lab today  Given materials to work on advanced directive/counseled on this

## 2014-12-11 NOTE — Assessment & Plan Note (Addendum)
bp in fair control at this time  BP Readings from Last 1 Encounters:  12/11/14 132/72   No changes needed Disc lifstyle change with low sodium diet and exercise  Labs ordered and med refilled

## 2014-12-11 NOTE — Assessment & Plan Note (Signed)
Hypothyroidism  Pt has no clinical changes No change in energy level/ hair or skin/ edema and no tremor tsh today

## 2014-12-11 NOTE — Assessment & Plan Note (Signed)
No flares on allopurinol  Check uric acid today  Check LFT  Doing well Knows what to avoid in diet

## 2014-12-11 NOTE — Assessment & Plan Note (Signed)
A1C today Last was under 6 He admits to eating sweets Wt is stable

## 2014-12-11 NOTE — Assessment & Plan Note (Signed)
Lab today  emph imp of exercise/active lifestyle in this  Also omega 3 intake

## 2014-12-11 NOTE — Assessment & Plan Note (Signed)
No change in symptoms at all In light of age-exam not done but will if symptoms worsen psa today

## 2014-12-11 NOTE — Progress Notes (Signed)
Pre visit review using our clinic review tool, if applicable. No additional management support is needed unless otherwise documented below in the visit note. 

## 2014-12-11 NOTE — Progress Notes (Signed)
Subjective:    Patient ID: Jamie Byrd, male    DOB: 01-29-1934, 81 y.o.   MRN: [id number]  HPI Here for annual medicare wellness visit as well as chronic/acute medical problems   Wt is up 1 lb with bmi of 31  I have personally reviewed the Medicare Annual Wellness questionnaire and have noted 1. The patient's medical and social history 2. Their use of alcohol, tobacco or illicit drugs 3. Their current medications and supplements 4. The patient's functional ability including ADL's, fall risks, home safety risks and hearing or visual             impairment. 5. Diet and physical activities 6. Evidence for depression or mood disorders  The patients weight, height, BMI have been recorded in the chart and visual acuity is per eye clinic.  I have made referrals, counseling and provided education to the patient based review of the above and I have provided the pt with a written personalized care plan for preventive services.  Doing well  Nothing new going on with health except worsening macular degeneration  Not able to drive much anymore   See scanned forms.  Routine anticipatory guidance given to patient.  See health maintenance. Colon cancer screening 4/14 - - did not recommend any recall due to age  Flu vaccine had it in the fall  Tetanus vaccine 11/10  Pneumovax 12/12 , will have prevnar today  Zoster vaccine-cannot get due to cost and lack of coverage - will double check Prostate cancer screening - over age however with BPH we continue to check psa  Advance directive - does not have/ will take a look at materials given  Cognitive function addressed- see scanned forms- and if abnormal then additional documentation follows. - memory is pretty good  Stays fairly busy with yard work and gardening   PMH and Roy reviewed  Meds, vitals, and allergies reviewed.   ROS: See HPI.  Otherwise negative.    bp is stable today  No cp or palpitations or headaches or edema  No side  effects to medicines  BP Readings from Last 3 Encounters:  12/11/14 132/72  11/17/14 130/80  12/09/13 138/85    Hypothyroidism  Pt has no clinical changes No change in energy level/ hair or skin/ edema and no tremor Lab Results  Component Value Date   TSH 5.16 01/31/2014    Due for tsh  Does not feel any different   Hx of hyperglycemia Due for a1c Lab Results  Component Value Date   HGBA1C 5.3 12/09/2013  diet is about the same - he does eat sweets "once in a while"  Hx of BPH Not a lot of symptoms  No changes  Gets up 2-3 times at night to urinate , no change from prev  Stream is about the same  Lab Results  Component Value Date   PSA 0.52 12/09/2013   PSA 0.49 12/08/2012   PSA 0.55 09/10/2011    Hx of gout on allopurinol No flares at all    Patient Active Problem List   Diagnosis Date Noted  . Acute pharyngitis 11/17/2014  . Hearing loss of aging 12/09/2013  . Encounter for Medicare annual wellness exam 12/09/2013  . Personal history of colonic polyps 12/08/2012  . Tinea versicolor 09/17/2011  . Prostate cancer screening 09/10/2011  . Hyperglycemia 09/25/2010  . Low HDL (under 40) 11/05/2009  . Hypothyroidism 07/28/2007  . Gout 07/28/2007  . ERECTILE DYSFUNCTION 07/28/2007  . Essential hypertension  07/28/2007  . BPH (benign prostatic hypertrophy) 07/28/2007   Past Medical History  Diagnosis Date  . Gout   . Hypertension   . Hypothyroidism   . BPH (benign prostatic hypertrophy)   . History of shingles   . Difficulty reading   . Difficulty seeing     Difficulty in seeing certain things and reading/ not legally blind!  . Macular degeneration    Past Surgical History  Procedure Laterality Date  . Colonoscopy    . Polypectomy     History  Substance Use Topics  . Smoking status: Former Smoker    Quit date: 10/13/1968  . Smokeless tobacco: Never Used  . Alcohol Use: No     Comment: no alcohol in 20 years   Family History  Problem Relation Age  of Onset  . Heart disease Mother     CABG  . Heart disease Father     CAD  . Cancer Father     lung CA  . Cancer Brother     lung CA   No Known Allergies Current Outpatient Prescriptions on File Prior to Visit  Medication Sig Dispense Refill  . allopurinol (ZYLOPRIM) 100 MG tablet Take 1 tablet (100 mg total) by mouth daily. 90 tablet 3  . colchicine 0.6 MG tablet Take 1 tablet (0.6 mg total) by mouth 2 (two) times daily as needed. 30 tablet 0  . levothyroxine (SYNTHROID, LEVOTHROID) 50 MCG tablet TAKE ONE TABLET BY MOUTH EVERY DAY 90 tablet 0  . lisinopril (PRINIVIL,ZESTRIL) 20 MG tablet Take 1 tablet (20 mg total) by mouth daily. 90 tablet 3   No current facility-administered medications on file prior to visit.     Review of Systems Review of Systems  Constitutional: Negative for fever, appetite change, fatigue and unexpected weight change.  Eyes: Negative for pain and pos for macular deg vision changes  ENT pos for dec hearing - does not want w/u Respiratory: Negative for cough and shortness of breath.   Cardiovascular: Negative for cp or palpitations    Gastrointestinal: Negative for nausea, diarrhea and constipation.  Genitourinary: Negative for urgency and frequency.  Skin: Negative for pallor or rash  (saw derm recently) [facility] pos for thickened tendons in hand -saw hand specialist for  Neurological: Negative for weakness, light-headedness, numbness and headaches.  Hematological: Negative for adenopathy. Does not bruise/bleed easily.  Psychiatric/Behavioral: Negative for dysphoric mood. The patient is not nervous/anxious.         Objective:   Physical Exam  Constitutional: He appears well-developed and well-nourished. No distress.  obese and well appearing   HENT:  Head: Normocephalic and atraumatic.  Right Ear: External ear normal.  Left Ear: External ear normal.  Nose: Nose normal.  Mouth/Throat: Oropharynx is clear and moist.  Partial cerumen occlusion bilat ear  canals Nl app TMs  Eyes: Conjunctivae and EOM are normal. Pupils are equal, round, and reactive to light. Right eye exhibits no discharge. Left eye exhibits no discharge. No scleral icterus.  Neck: Normal range of motion. Neck supple. No JVD present. Carotid bruit is not present. No thyromegaly present.  Cardiovascular: Normal rate, regular rhythm, normal heart sounds and intact distal pulses.  Exam reveals no gallop.   Pulmonary/Chest: Effort normal and breath sounds normal. No respiratory distress. He has no wheezes. He exhibits no tenderness.  Abdominal: Soft. Bowel sounds are normal. He exhibits no distension, no abdominal bruit and no mass. There is no tenderness.  Musculoskeletal: He exhibits no edema or tenderness.  Lymphadenopathy:    He has no cervical adenopathy.  Neurological: He is alert. He has normal reflexes. No cranial nerve deficit. He exhibits normal muscle tone. Coordination normal.  Skin: Skin is warm and dry. No rash noted. No erythema. No pallor.  Solar lentigos diffusely  AKs and SKs diffusely  Psychiatric: He has a normal mood and affect.          Assessment & Plan:   Problem List Items Addressed This Visit      Cardiovascular and Mediastinum   Essential hypertension    bp in fair control at this time  BP Readings from Last 1 Encounters:  12/11/14 132/72   No changes needed Disc lifstyle change with low sodium diet and exercise  Labs ordered and med refilled       Relevant Medications   lisinopril (PRINIVIL,ZESTRIL) tablet   Other Relevant Orders   CBC with Differential/Platelet   Comprehensive metabolic panel   TSH   Lipid panel     Endocrine   Hypothyroidism    Hypothyroidism  Pt has no clinical changes No change in energy level/ hair or skin/ edema and no tremor tsh today      Relevant Medications   levothyroxine (SYNTHROID, LEVOTHROID) tablet   Other Relevant Orders   TSH     Genitourinary   BPH (benign prostatic hypertrophy)     No change in symptoms at all In light of age-exam not done but will if symptoms worsen psa today      Relevant Orders   PSA     Other   Encounter for Medicare annual wellness exam - Primary    Reviewed health habits including diet and exercise and skin cancer prevention Reviewed appropriate screening tests for age  Also reviewed health mt list, fam hx and immunization status , as well as social and family history   See HPI Continues opthy exams for macular deg Does not want formal hearing exam yet prevnar today Lab today  Given materials to work on advanced directive/counseled on this       Gout    No flares on allopurinol  Check uric acid today  Check LFT  Doing well Knows what to avoid in diet       Relevant Orders   Uric acid   Hyperglycemia    A1C today Last was under 6 He admits to eating sweets Wt is stable       Relevant Orders   Hemoglobin A1c   Low HDL (under 40)    Lab today  emph imp of exercise/active lifestyle in this  Also omega 3 intake       Relevant Orders   Lipid panel    Other Visit Diagnoses    Need for pneumococcal vaccination        Relevant Orders    Pneumococcal conjugate vaccine 13-valent IM (Completed)

## 2014-12-12 LAB — URIC ACID: Uric Acid, Serum: 4.7 mg/dL (ref 4.0–7.8)

## 2014-12-12 LAB — LIPID PANEL
CHOLESTEROL: 142 mg/dL (ref 0–200)
HDL: 32.3 mg/dL — ABNORMAL LOW (ref >39.00)
LDL Cholesterol: 83 mg/dL (ref 0–99)
NONHDL: 109.7
Total CHOL/HDL Ratio: 4
Triglycerides: 135 mg/dL (ref 0.0–149.0)
VLDL: 27 mg/dL (ref 0.0–40.0)

## 2014-12-13 ENCOUNTER — Encounter: Payer: Self-pay | Admitting: *Deleted

## 2015-05-14 DIAGNOSIS — L812 Freckles: Secondary | ICD-10-CM | POA: Diagnosis not present

## 2015-05-14 DIAGNOSIS — L821 Other seborrheic keratosis: Secondary | ICD-10-CM | POA: Diagnosis not present

## 2015-05-14 DIAGNOSIS — Z85828 Personal history of other malignant neoplasm of skin: Secondary | ICD-10-CM | POA: Diagnosis not present

## 2015-05-14 DIAGNOSIS — L570 Actinic keratosis: Secondary | ICD-10-CM | POA: Diagnosis not present

## 2015-06-28 DIAGNOSIS — Z23 Encounter for immunization: Secondary | ICD-10-CM | POA: Diagnosis not present

## 2015-07-03 DIAGNOSIS — H3531 Nonexudative age-related macular degeneration: Secondary | ICD-10-CM | POA: Diagnosis not present

## 2015-07-24 ENCOUNTER — Other Ambulatory Visit: Payer: Self-pay | Admitting: Family Medicine

## 2015-08-23 DIAGNOSIS — H02052 Trichiasis without entropian right lower eyelid: Secondary | ICD-10-CM | POA: Diagnosis not present

## 2015-10-29 DIAGNOSIS — C44619 Basal cell carcinoma of skin of left upper limb, including shoulder: Secondary | ICD-10-CM | POA: Diagnosis not present

## 2015-10-29 DIAGNOSIS — L570 Actinic keratosis: Secondary | ICD-10-CM | POA: Diagnosis not present

## 2015-10-29 DIAGNOSIS — L821 Other seborrheic keratosis: Secondary | ICD-10-CM | POA: Diagnosis not present

## 2015-10-29 DIAGNOSIS — Z85828 Personal history of other malignant neoplasm of skin: Secondary | ICD-10-CM | POA: Diagnosis not present

## 2015-10-29 DIAGNOSIS — L218 Other seborrheic dermatitis: Secondary | ICD-10-CM | POA: Diagnosis not present

## 2015-10-29 DIAGNOSIS — D485 Neoplasm of uncertain behavior of skin: Secondary | ICD-10-CM | POA: Diagnosis not present

## 2015-10-31 DIAGNOSIS — H02051 Trichiasis without entropian right upper eyelid: Secondary | ICD-10-CM | POA: Diagnosis not present

## 2015-12-13 ENCOUNTER — Ambulatory Visit (INDEPENDENT_AMBULATORY_CARE_PROVIDER_SITE_OTHER): Payer: Medicare Other

## 2015-12-13 VITALS — BP 140/82 | HR 57 | Temp 97.7°F | Ht 71.25 in | Wt 223.2 lb

## 2015-12-13 DIAGNOSIS — Z0000 Encounter for general adult medical examination without abnormal findings: Secondary | ICD-10-CM | POA: Diagnosis not present

## 2015-12-13 DIAGNOSIS — N400 Enlarged prostate without lower urinary tract symptoms: Secondary | ICD-10-CM | POA: Diagnosis not present

## 2015-12-13 DIAGNOSIS — E039 Hypothyroidism, unspecified: Secondary | ICD-10-CM

## 2015-12-13 DIAGNOSIS — I10 Essential (primary) hypertension: Secondary | ICD-10-CM | POA: Diagnosis not present

## 2015-12-13 DIAGNOSIS — R739 Hyperglycemia, unspecified: Secondary | ICD-10-CM | POA: Diagnosis not present

## 2015-12-13 DIAGNOSIS — E786 Lipoprotein deficiency: Secondary | ICD-10-CM | POA: Diagnosis not present

## 2015-12-13 LAB — COMPREHENSIVE METABOLIC PANEL
ALK PHOS: 103 U/L (ref 39–117)
ALT: 14 U/L (ref 0–53)
AST: 20 U/L (ref 0–37)
Albumin: 4.6 g/dL (ref 3.5–5.2)
BUN: 13 mg/dL (ref 6–23)
CO2: 31 meq/L (ref 19–32)
Calcium: 9.8 mg/dL (ref 8.4–10.5)
Chloride: 100 mEq/L (ref 96–112)
Creatinine, Ser: 0.86 mg/dL (ref 0.40–1.50)
GFR: 91.12 mL/min (ref >60.00)
Glucose, Bld: 107 mg/dL — ABNORMAL HIGH (ref 70–99)
POTASSIUM: 4.3 meq/L (ref 3.5–5.1)
SODIUM: 136 meq/L (ref 135–145)
TOTAL PROTEIN: 7.2 g/dL (ref 6.0–8.3)
Total Bilirubin: 0.9 mg/dL (ref 0.2–1.2)

## 2015-12-13 LAB — LIPID PANEL
Cholesterol: 126 mg/dL (ref 0–200)
HDL: 28.6 mg/dL — AB (ref >39.00)
LDL Cholesterol: 68 mg/dL (ref 0–99)
NONHDL: 97.83
TRIGLYCERIDES: 150 mg/dL — AB (ref 0.0–149.0)
Total CHOL/HDL Ratio: 4
VLDL: 30 mg/dL (ref 0.0–40.0)

## 2015-12-13 LAB — CBC WITH DIFFERENTIAL/PLATELET
BASOS PCT: 0.6 % (ref 0.0–3.0)
Basophils Absolute: 0 10*3/uL (ref 0.0–0.1)
EOS PCT: 4.1 % (ref 0.0–5.0)
Eosinophils Absolute: 0.3 10*3/uL (ref 0.0–0.7)
HCT: 45.1 % (ref 39.0–52.0)
Hemoglobin: 15.1 g/dL (ref 13.0–17.0)
Lymphocytes Relative: 35.1 % (ref 12.0–46.0)
Lymphs Abs: 2.2 10*3/uL (ref 0.7–4.0)
MCHC: 33.5 g/dL (ref 30.0–36.0)
MCV: 95.4 fl (ref 78.0–100.0)
MONO ABS: 0.5 10*3/uL (ref 0.1–1.0)
MONOS PCT: 8.6 % (ref 3.0–12.0)
NEUTROS ABS: 3.2 10*3/uL (ref 1.4–7.7)
NEUTROS PCT: 51.6 % (ref 43.0–77.0)
PLATELETS: 223 10*3/uL (ref 150.0–400.0)
RBC: 4.72 Mil/uL (ref 4.22–5.81)
RDW: 14.4 % (ref 11.5–15.5)
WBC: 6.2 10*3/uL (ref 4.0–10.5)

## 2015-12-13 LAB — CREATININE, SERUM: Creatinine, Ser: 0.86 mg/dL (ref 0.40–1.50)

## 2015-12-13 LAB — HEMOGLOBIN A1C: Hgb A1c MFr Bld: 5.3 % (ref 4.6–6.5)

## 2015-12-13 LAB — TSH: TSH: 4.86 u[IU]/mL — AB (ref 0.35–4.50)

## 2015-12-13 LAB — PSA: PSA: 0.48 ng/mL (ref 0.10–4.00)

## 2015-12-13 NOTE — Patient Instructions (Signed)
Jamie Byrd , Thank you for taking time to come for your Medicare Wellness Visit. I appreciate your ongoing commitment to your health goals. Please review the following plan we discussed and let me know if I can assist you in the future.   These are the goals we discussed: Goals    . Exercise 3x per week (30 min per time)     Starting 12/13/15, I will continue to walk up to 1 mile daily.        This is a list of the screening recommended for you and due dates:  Health Maintenance  Topic Date Due  . Shingles Vaccine  11/14/2020*  . Flu Shot  05/13/2016  . Tetanus Vaccine  09/11/2019  . Pneumonia vaccines  Completed  *Topic was postponed. The date shown is not the original due date.   Preventive Care for Adults  A healthy lifestyle and preventive care can promote health and wellness. Preventive health guidelines for adults include the following key practices.  . A routine yearly physical is a good way to check with your health care provider about your health and preventive screening. It is a chance to share any concerns and updates on your health and to receive a thorough exam.  . Visit your dentist for a routine exam and preventive care every 6 months. Brush your teeth twice a day and floss once a day. Good oral hygiene prevents tooth decay and gum disease.  . The frequency of eye exams is based on your age, health, family medical history, use  of contact lenses, and other factors. Follow your health care provider's ecommendations for frequency of eye exams.  . Eat a healthy diet. Foods like vegetables, fruits, whole grains, low-fat dairy products, and lean protein foods contain the nutrients you need without too many calories. Decrease your intake of foods high in solid fats, added sugars, and salt. Eat the right amount of calories for you. Get information about a proper diet from your health care provider, if necessary.  . Regular physical exercise is one of the most important things  you can do for your health. Most adults should get at least 150 minutes of moderate-intensity exercise (any activity that increases your heart rate and causes you to sweat) each week. In addition, most adults need muscle-strengthening exercises on 2 or more days a week.  . Maintain a healthy weight. The body mass index (BMI) is a screening tool to identify possible weight problems. It provides an estimate of body fat based on height and weight. Your health care provider can find your BMI and can help you achieve or maintain a healthy weight.   For adults 20 years and older: ? A BMI below 18.5 is considered underweight. ? A BMI of 18.5 to 24.9 is normal. ? A BMI of 25 to 29.9 is considered overweight. ? A BMI of 30 and above is considered obese.   . Maintain normal blood lipids and cholesterol levels by exercising and minimizing your intake of saturated fat. Eat a balanced diet with plenty of fruit and vegetables. Blood tests for lipids and cholesterol should begin at age 65 and be repeated every 5 years. If your lipid or cholesterol levels are high, you are over 50, or you are at high risk for heart disease, you may need your cholesterol levels checked more frequently. Ongoing high lipid and cholesterol levels should be treated with medicines if diet and exercise are not working.  . If you smoke, find out  from your health care provider how to quit. If you do not use tobacco, please do not start.  . If you choose to drink alcohol, please do not consume more than 2 drinks per day. One drink is considered to be 12 ounces (355 mL) of beer, 5 ounces (148 mL) of wine, or 1.5 ounces (44 mL) of liquor.  . If you are 1-68 years old, ask your health care provider if you should take aspirin to prevent strokes.  . Use sunscreen. Apply sunscreen liberally and repeatedly throughout the day. You should seek shade when your shadow is shorter than you. Protect yourself by wearing long sleeves, pants, a  wide-brimmed hat, and sunglasses year round, whenever you are outdoors.  . Once a month, do a whole body skin exam, using a mirror to look at the skin on your back. Tell your health care provider of new moles, moles that have irregular borders, moles that are larger than a pencil eraser, or moles that have changed in shape or color.

## 2015-12-13 NOTE — Progress Notes (Signed)
Pre visit review using our clinic review tool, if applicable. No additional management support is needed unless otherwise documented below in the visit note. 

## 2015-12-13 NOTE — Progress Notes (Signed)
   Subjective:    Patient ID: Jamie Byrd, male    DOB: September 29, 1934, 82 y.o.   MRN: [id number]  HPI    Review of Systems     Objective:   Physical Exam        Assessment & Plan:  I reviewed health advisor's note, was available for consultation, and agree with documentation and plan.

## 2015-12-13 NOTE — Progress Notes (Signed)
Subjective:   Jamie Byrd is a 82 y.o. male who presents for Medicare Annual/Subsequent preventive examination.   Cardiac Risk Factors include: advanced age (>25men, >14 women);hypertension;male gender;diabetes mellitus     Objective:    Vitals: BP 140/82 mmHg  Pulse 57  Temp(Src) 97.7 F (36.5 C) (Oral)  Ht 5' 11.25" (1.81 m)  Wt 223 lb 4 oz (101.266 kg)  BMI 30.91 kg/m2  SpO2 99%  Tobacco History  Smoking status  . Former Smoker  . Quit date: 10/13/1968  Smokeless tobacco  . Never Used     Counseling given: No   Past Medical History  Diagnosis Date  . Gout   . Hypertension   . Hypothyroidism   . BPH (benign prostatic hypertrophy)   . History of shingles   . Difficulty reading   . Difficulty seeing     Difficulty in seeing certain things and reading/ not legally blind!  . Macular degeneration    Past Surgical History  Procedure Laterality Date  . Colonoscopy    . Polypectomy     Family History  Problem Relation Age of Onset  . Heart disease Mother     CABG  . Heart disease Father     CAD  . Cancer Father     lung CA  . Cancer Brother     lung CA   History  Sexual Activity  . Sexual Activity: No    Outpatient Encounter Prescriptions as of 12/13/2015  Medication Sig  . allopurinol (ZYLOPRIM) 100 MG tablet TAKE 1 TABLET BY MOUTH ONCE A DAY  . colchicine 0.6 MG tablet Take 1 tablet (0.6 mg total) by mouth 2 (two) times daily as needed.  Marland Kitchen levothyroxine (SYNTHROID, LEVOTHROID) 50 MCG tablet Take 1 tablet (50 mcg total) by mouth daily.  Marland Kitchen lisinopril (PRINIVIL,ZESTRIL) 20 MG tablet Take 1 tablet (20 mg total) by mouth daily.   No facility-administered encounter medications on file as of 12/13/2015.    Activities of Daily Living In your present state of health, do you have any difficulty performing the following activities: 12/13/2015  Hearing? Y  Vision? Y  Difficulty concentrating or making decisions? N  Walking or climbing stairs? N    Dressing or bathing? N  Doing errands, shopping? Y  Preparing Food and eating ? N  Using the Toilet? N  In the past six months, have you accidently leaked urine? N  Do you have problems with loss of bowel control? N  Managing your Medications? N  Managing your Finances? N  Housekeeping or managing your Housekeeping? N    Patient Care Team: Abner Greenspan, MD as PCP - General   Jarome Matin, MD as Consulting Physician - Dermatology Assessment:     Exercise Activities and Dietary recommendations Current Exercise Habits:: Home exercise routine, Type of exercise: walking, Time (Minutes): 30, Frequency (Times/Week): > 6, Weekly Exercise (Minutes/Week): 0, Intensity: Mild  Goals    . Exercise 3x per week (30 min per time)     Starting 12/13/15, I will continue to walk up to 1 mile daily.       Fall Risk Fall Risk  12/13/2015 12/11/2014 12/09/2013  Falls in the past year? No No No  Risk for fall due to : Impaired vision - -   Depression Screen PHQ 2/9 Scores 12/13/2015 12/11/2014 12/09/2013  PHQ - 2 Score 0 0 0    Cognitive Testing MMSE - Mini Mental State Exam 12/13/2015  Orientation to time 5  Orientation  to Place 5  Registration 3  Attention/ Calculation 0  Recall 0  Language- name 2 objects 0  Language- repeat 1  Language- follow 3 step command 3  Language- read & follow direction 1  Write a sentence 0  Copy design 0  Total score 18    Immunization History  Administered Date(s) Administered  . Influenza Split 09/17/2011, 07/23/2012  . Influenza Whole 08/25/2006, 09/10/2009, 09/25/2010  . Influenza, Seasonal, Injecte, Preservative Fre 06/28/2015  . Influenza,inj,Quad PF,36+ Mos 07/07/2013, 07/19/2014  . Influenza-Unspecified 06/28/2015  . Pneumococcal Conjugate-13 12/11/2014  . Pneumococcal Polysaccharide-23 09/17/2011  . Td 07/14/1999, 09/10/2009   Screening Tests Health Maintenance  Topic Date Due  . ZOSTAVAX  11/14/2020 (Originally 09/14/1996)  . INFLUENZA VACCINE   05/13/2016  . TETANUS/TDAP  09/11/2019  . PNA vac Low Risk Adult  Completed      Plan:    I have noted the following in the patient's chart:  A. Medical and social history B. Use of alcohol, tobacco or illicit drugs  C. Current medications and supplements D. Functional ability and status E.  Nutritional status F.  Physical activity G. Advance directives H. List of other physicians I.  Hospitalizations, surgeries, and ER visits in previous 12 months J.  [place] to include hearing, vision, cognitive, depression L. Referrals, if needed  In addition, I reviewed preventive protocols, quality metrics, and best practice recommendations specific to patient. A written personalized care plan for preventive services as well as general preventive health recommendations was provided to patient.  Signed,   Lindell Noe, MHA, BS, LPN Health Advisor [id number]

## 2015-12-14 ENCOUNTER — Encounter: Payer: Self-pay | Admitting: Family Medicine

## 2015-12-14 ENCOUNTER — Ambulatory Visit (INDEPENDENT_AMBULATORY_CARE_PROVIDER_SITE_OTHER): Payer: Medicare Other | Admitting: Family Medicine

## 2015-12-14 VITALS — BP 128/80 | HR 69 | Temp 97.2°F | Ht 71.25 in | Wt 222.8 lb

## 2015-12-14 DIAGNOSIS — N400 Enlarged prostate without lower urinary tract symptoms: Secondary | ICD-10-CM | POA: Diagnosis not present

## 2015-12-14 DIAGNOSIS — I10 Essential (primary) hypertension: Secondary | ICD-10-CM

## 2015-12-14 DIAGNOSIS — R413 Other amnesia: Secondary | ICD-10-CM | POA: Insufficient documentation

## 2015-12-14 DIAGNOSIS — R739 Hyperglycemia, unspecified: Secondary | ICD-10-CM

## 2015-12-14 DIAGNOSIS — E039 Hypothyroidism, unspecified: Secondary | ICD-10-CM

## 2015-12-14 DIAGNOSIS — E786 Lipoprotein deficiency: Secondary | ICD-10-CM

## 2015-12-14 DIAGNOSIS — H9113 Presbycusis, bilateral: Secondary | ICD-10-CM

## 2015-12-14 MED ORDER — LISINOPRIL 20 MG PO TABS: 20.0000 mg | ORAL_TABLET | Freq: Every day | ORAL | Status: DC

## 2015-12-14 MED ORDER — ALLOPURINOL 100 MG PO TABS: 100.0000 mg | ORAL_TABLET | Freq: Every day | ORAL | Status: DC

## 2015-12-14 MED ORDER — LEVOTHYROXINE SODIUM 50 MCG PO TABS: 50.0000 ug | ORAL_TABLET | Freq: Every day | ORAL | Status: DC

## 2015-12-14 NOTE — Progress Notes (Signed)
Subjective:    Patient ID: Jamie Byrd, male    DOB: [birth year], 82 y.o.   MRN: [id number]  HPI  Here for annual rev of chronic medical problems   Is doing well No c/o  Getting outdoors  Exercising  Eating well    Rev his AMW with nurse recently   Still problems with hearing/age related Does not want further eval  He is not that bothered and does not want hearing aides   MMS- performed low /diff with recall -cog test score 18 He has not noticed any problems  Cannot remember "like I did 20-30 years ago" No one has c/o of a problem  He occ misplaces things- forgets a words  Does not get lost in familiar places No accidents  No falls   Macular deg-does not drive anymore Does not want med at this point   He has a sister in early stages of dementia in her 73s    Wt is down 2 lb with bmi of 77  Exercising and taking care of himself   Had colonoscpy 2014- one polyp and no recall scheduled for that due to age  Does not want to further screen   Prostate - no c/o (nocturia 2 times per night-baseline)  Lab Results  Component Value Date   PSA 0.48 12/13/2015   PSA 0.55 12/11/2014   PSA 0.52 12/09/2013   no problems for flow   bp is stable today  No cp or palpitations or headaches or edema  No side effects to medicines  BP Readings from Last 3 Encounters:  12/14/15 142/82  12/13/15 140/82  12/11/14 132/72      Hypothyroidism  Pt has no clinical changes No change in energy level/ hair or skin/ edema and no tremor Lab Results  Component Value Date   TSH 4.86* 12/13/2015    No missed doses- takes it every am  Not sluggish/tired    Hyperglycemia Lab Results  Component Value Date   HGBA1C 5.3 12/13/2015    Cholesterol Lab Results  Component Value Date   CHOL 126 12/13/2015   CHOL 142 12/11/2014   CHOL 149 12/09/2013   Lab Results  Component Value Date   HDL 28.60* 12/13/2015   HDL 32.30* 12/11/2014   HDL 29.70* 12/09/2013   Lab Results    Component Value Date   LDLCALC 68 12/13/2015   LDLCALC 83 12/11/2014   LDLCALC 96 12/09/2013   Lab Results  Component Value Date   TRIG 150.0* 12/13/2015   TRIG 135.0 12/11/2014   TRIG 119.0 12/09/2013   Lab Results  Component Value Date   CHOLHDL 4 12/13/2015   CHOLHDL 4 12/11/2014   CHOLHDL 5 12/09/2013   No results found for: LDLDIRECT  Overall stable  HDL stays low chronically  occ eats fish  Would consider fish oil/omega 3   Patient Active Problem List   Diagnosis Date Noted  . Memory loss, short term 12/14/2015  . Hearing loss of aging 12/09/2013  . Encounter for Medicare annual wellness exam 12/09/2013  . Personal history of colonic polyps 12/08/2012  . Tinea versicolor 09/17/2011  . Prostate cancer screening 09/10/2011  . Hyperglycemia 09/25/2010  . Low HDL (under 40) 11/05/2009  . Hypothyroidism 07/28/2007  . Gout 07/28/2007  . ERECTILE DYSFUNCTION 07/28/2007  . Essential hypertension 07/28/2007  . BPH (benign prostatic hypertrophy) 07/28/2007   Past Medical History  Diagnosis Date  . Gout   . Hypertension   . Hypothyroidism   .  BPH (benign prostatic hypertrophy)   . History of shingles   . Difficulty reading   . Difficulty seeing     Difficulty in seeing certain things and reading/ not legally blind!  . Macular degeneration    Past Surgical History  Procedure Laterality Date  . Colonoscopy    . Polypectomy     Social History  Substance Use Topics  . Smoking status: Former Smoker    Quit date: 10/13/1968  . Smokeless tobacco: Never Used  . Alcohol Use: No     Comment: no alcohol in 20 years   Family History  Problem Relation Age of Onset  . Heart disease Mother     CABG  . Heart disease Father     CAD  . Cancer Father     lung CA  . Cancer Brother     lung CA   No Known Allergies Current Outpatient Prescriptions on File Prior to Visit  Medication Sig Dispense Refill  . colchicine 0.6 MG tablet Take 1 tablet (0.6 mg total) by  mouth 2 (two) times daily as needed. 30 tablet 0   No current facility-administered medications on file prior to visit.    Review of Systems Review of Systems  Constitutional: Negative for fever, appetite change, fatigue and unexpected weight change.  Eyes: Negative for pain and pos for poor vision from macular deg  ENt pos for hearing loss  Respiratory: Negative for cough and shortness of breath.   Cardiovascular: Negative for cp or palpitations    Gastrointestinal: Negative for nausea, diarrhea and constipation.  Genitourinary: Negative for urgency and frequency.  Skin: Negative for pallor or rash   Neurological: Negative for weakness, light-headedness, numbness and headaches.  Hematological: Negative for adenopathy. Does not bruise/bleed easily.  Psychiatric/Behavioral: Negative for dysphoric mood. The patient is not nervous/anxious.  denies memory problems he is aware of        Objective:   Physical Exam  Constitutional: He appears well-developed and well-nourished. No distress.  obese and well appearing   HENT:  Head: Normocephalic and atraumatic.  Right Ear: External ear normal.  Left Ear: External ear normal.  Nose: Nose normal.  Mouth/Throat: Oropharynx is clear and moist.  Quite HOH  Eyes: Conjunctivae and EOM are normal. Pupils are equal, round, and reactive to light. Right eye exhibits no discharge. Left eye exhibits no discharge. No scleral icterus.  Neck: Normal range of motion. Neck supple. No JVD present. Carotid bruit is not present. No thyromegaly present.  Cardiovascular: Normal rate, regular rhythm, normal heart sounds and intact distal pulses.  Exam reveals no gallop.   Pulmonary/Chest: Effort normal and breath sounds normal. No respiratory distress. He has no wheezes. He exhibits no tenderness.  Abdominal: Soft. Bowel sounds are normal. He exhibits no distension, no abdominal bruit and no mass. There is no tenderness.  Musculoskeletal: He exhibits no edema or  tenderness.  Lymphadenopathy:    He has no cervical adenopathy.  Neurological: He is alert. He has normal reflexes. No cranial nerve deficit. He exhibits normal muscle tone. Coordination normal.  Skin: Skin is warm and dry. No rash noted. No erythema. No pallor.  Some SKs  Psychiatric: He has a normal mood and affect.  Good mood Bright and talkative  No obvious cognitive defects/ though he is HOH  Answered all questions appropriately today          Assessment & Plan:   Problem List Items Addressed This Visit      Cardiovascular and  Mediastinum   Essential hypertension - Primary    bp in fair control at this time -improved on 2nd check BP Readings from Last 1 Encounters:  12/14/15 128/80   No changes needed Disc lifstyle change with low sodium diet and exercise        Relevant Medications   lisinopril (PRINIVIL,ZESTRIL) 20 MG tablet     Endocrine   Hypothyroidism    Hypothyroidism  Pt has no clinical changes No change in energy level/ hair or skin/ edema and no tremor Lab Results  Component Value Date   TSH 4.86* 12/13/2015    This is slt elevated -will plan to re check in 6 mo (or earlier if symptoms) and then adj dose if needed       Relevant Medications   levothyroxine (SYNTHROID, LEVOTHROID) 50 MCG tablet     Nervous and Auditory   Hearing loss of aging    Noted on screening Pt still declines further eval and tx Disc safety  Will update if he changes his mind         Genitourinary   BPH (benign prostatic hypertrophy)    Lab Results  Component Value Date   PSA 0.48 12/13/2015   PSA 0.55 12/11/2014   PSA 0.52 12/09/2013   No change in symptoms  Does not desire tx Will continue to follow         Other   Hyperglycemia    This is well controlled with diet and activity Lab Results  Component Value Date   HGBA1C 5.3 12/13/2015   Continue to follow       Low HDL (under 40)    Disc goals for lipids and reasons to control them Rev labs  with pt Rev low sat fat diet in detail Disc trial of omega 3/fish oil  Also enc to stay active       Memory loss, short term    See overview- lower score than expected at screening  Mentally sharp today Poor hearing plays a role also  Re check 6 mo - also with thyroid

## 2015-12-14 NOTE — Patient Instructions (Addendum)
Take care of yourself  Labs are stable (thyroid is borderline)- we will check it again in 6 months  Follow up with me in 6 months for a visit- I want to keep an eye on your memory also  Blood pressure was good the 2nd check  If you want to see a hearing specialist in the future- let me know  Eat a healthy diet and stay active (physically and mentally)   Take a look at fish oil supplements over the counter (omega 3 )- and tro 1000- 3000 mg daily  This will help cholesterol a bit more

## 2015-12-14 NOTE — Progress Notes (Signed)
Pre visit review using our clinic review tool, if applicable. No additional management support is needed unless otherwise documented below in the visit note. 

## 2015-12-15 NOTE — Assessment & Plan Note (Signed)
Hypothyroidism  Pt has no clinical changes No change in energy level/ hair or skin/ edema and no tremor Lab Results  Component Value Date   TSH 4.86* 12/13/2015    This is slt elevated -will plan to re check in 6 mo (or earlier if symptoms) and then adj dose if needed

## 2015-12-15 NOTE — Assessment & Plan Note (Signed)
See overview- lower score than expected at screening  Mentally sharp today Poor hearing plays a role also  Re check 6 mo - also with thyroid

## 2015-12-15 NOTE — Assessment & Plan Note (Signed)
Lab Results  Component Value Date   PSA 0.48 12/13/2015   PSA 0.55 12/11/2014   PSA 0.52 12/09/2013   No change in symptoms  Does not desire tx Will continue to follow

## 2015-12-15 NOTE — Assessment & Plan Note (Signed)
This is well controlled with diet and activity Lab Results  Component Value Date   HGBA1C 5.3 12/13/2015   Continue to follow

## 2015-12-15 NOTE — Assessment & Plan Note (Signed)
Disc goals for lipids and reasons to control them Rev labs with pt Rev low sat fat diet in detail Disc trial of omega 3/fish oil  Also enc to stay active

## 2015-12-15 NOTE — Assessment & Plan Note (Signed)
bp in fair control at this time -improved on 2nd check BP Readings from Last 1 Encounters:  12/14/15 128/80   No changes needed Disc lifstyle change with low sodium diet and exercise

## 2015-12-15 NOTE — Assessment & Plan Note (Signed)
Noted on screening Pt still declines further eval and tx Disc safety  Will update if he changes his mind

## 2016-01-15 DIAGNOSIS — H353134 Nonexudative age-related macular degeneration, bilateral, advanced atrophic with subfoveal involvement: Secondary | ICD-10-CM | POA: Diagnosis not present

## 2016-01-15 DIAGNOSIS — H43813 Vitreous degeneration, bilateral: Secondary | ICD-10-CM | POA: Diagnosis not present

## 2016-01-15 DIAGNOSIS — H35423 Microcystoid degeneration of retina, bilateral: Secondary | ICD-10-CM | POA: Diagnosis not present

## 2016-02-04 DIAGNOSIS — H02051 Trichiasis without entropian right upper eyelid: Secondary | ICD-10-CM | POA: Diagnosis not present

## 2016-03-13 ENCOUNTER — Encounter: Payer: Self-pay | Admitting: Gastroenterology

## 2016-05-01 DIAGNOSIS — L821 Other seborrheic keratosis: Secondary | ICD-10-CM | POA: Diagnosis not present

## 2016-05-01 DIAGNOSIS — C44319 Basal cell carcinoma of skin of other parts of face: Secondary | ICD-10-CM | POA: Diagnosis not present

## 2016-05-01 DIAGNOSIS — D485 Neoplasm of uncertain behavior of skin: Secondary | ICD-10-CM | POA: Diagnosis not present

## 2016-05-01 DIAGNOSIS — L578 Other skin changes due to chronic exposure to nonionizing radiation: Secondary | ICD-10-CM | POA: Diagnosis not present

## 2016-05-01 DIAGNOSIS — L820 Inflamed seborrheic keratosis: Secondary | ICD-10-CM | POA: Diagnosis not present

## 2016-05-01 DIAGNOSIS — C44112 Basal cell carcinoma of skin of right eyelid, including canthus: Secondary | ICD-10-CM | POA: Diagnosis not present

## 2016-05-01 DIAGNOSIS — L570 Actinic keratosis: Secondary | ICD-10-CM | POA: Diagnosis not present

## 2016-05-01 DIAGNOSIS — Z85828 Personal history of other malignant neoplasm of skin: Secondary | ICD-10-CM | POA: Diagnosis not present

## 2016-05-13 DIAGNOSIS — H02051 Trichiasis without entropian right upper eyelid: Secondary | ICD-10-CM | POA: Diagnosis not present

## 2016-05-20 DIAGNOSIS — C44319 Basal cell carcinoma of skin of other parts of face: Secondary | ICD-10-CM | POA: Diagnosis not present

## 2016-05-20 DIAGNOSIS — Z85828 Personal history of other malignant neoplasm of skin: Secondary | ICD-10-CM | POA: Diagnosis not present

## 2016-06-11 DIAGNOSIS — H3530 Unspecified macular degeneration: Secondary | ICD-10-CM | POA: Diagnosis not present

## 2016-06-11 DIAGNOSIS — Z961 Presence of intraocular lens: Secondary | ICD-10-CM | POA: Diagnosis not present

## 2016-06-17 ENCOUNTER — Ambulatory Visit: Payer: Medicare Other | Admitting: Family Medicine

## 2016-07-15 DIAGNOSIS — H43813 Vitreous degeneration, bilateral: Secondary | ICD-10-CM | POA: Diagnosis not present

## 2016-07-15 DIAGNOSIS — H353134 Nonexudative age-related macular degeneration, bilateral, advanced atrophic with subfoveal involvement: Secondary | ICD-10-CM | POA: Diagnosis not present

## 2016-07-15 DIAGNOSIS — H35423 Microcystoid degeneration of retina, bilateral: Secondary | ICD-10-CM | POA: Diagnosis not present

## 2016-07-31 DIAGNOSIS — D485 Neoplasm of uncertain behavior of skin: Secondary | ICD-10-CM | POA: Diagnosis not present

## 2016-07-31 DIAGNOSIS — C44622 Squamous cell carcinoma of skin of right upper limb, including shoulder: Secondary | ICD-10-CM | POA: Diagnosis not present

## 2016-07-31 DIAGNOSIS — Z85828 Personal history of other malignant neoplasm of skin: Secondary | ICD-10-CM | POA: Diagnosis not present

## 2016-07-31 DIAGNOSIS — L570 Actinic keratosis: Secondary | ICD-10-CM | POA: Diagnosis not present

## 2016-08-08 ENCOUNTER — Ambulatory Visit (INDEPENDENT_AMBULATORY_CARE_PROVIDER_SITE_OTHER): Payer: Medicare Other

## 2016-08-08 DIAGNOSIS — Z23 Encounter for immunization: Secondary | ICD-10-CM | POA: Diagnosis not present

## 2016-10-15 DIAGNOSIS — H02052 Trichiasis without entropian right lower eyelid: Secondary | ICD-10-CM | POA: Diagnosis not present

## 2016-10-15 DIAGNOSIS — H02055 Trichiasis without entropian left lower eyelid: Secondary | ICD-10-CM | POA: Diagnosis not present

## 2016-11-04 DIAGNOSIS — L821 Other seborrheic keratosis: Secondary | ICD-10-CM | POA: Diagnosis not present

## 2016-11-04 DIAGNOSIS — L218 Other seborrheic dermatitis: Secondary | ICD-10-CM | POA: Diagnosis not present

## 2016-11-04 DIAGNOSIS — L812 Freckles: Secondary | ICD-10-CM | POA: Diagnosis not present

## 2016-11-04 DIAGNOSIS — Z85828 Personal history of other malignant neoplasm of skin: Secondary | ICD-10-CM | POA: Diagnosis not present

## 2016-11-04 DIAGNOSIS — L570 Actinic keratosis: Secondary | ICD-10-CM | POA: Diagnosis not present

## 2016-12-08 DIAGNOSIS — H02051 Trichiasis without entropian right upper eyelid: Secondary | ICD-10-CM | POA: Diagnosis not present

## 2016-12-17 ENCOUNTER — Ambulatory Visit (INDEPENDENT_AMBULATORY_CARE_PROVIDER_SITE_OTHER): Payer: Medicare Other

## 2016-12-17 ENCOUNTER — Encounter: Payer: Self-pay | Admitting: Family Medicine

## 2016-12-17 ENCOUNTER — Ambulatory Visit (INDEPENDENT_AMBULATORY_CARE_PROVIDER_SITE_OTHER): Payer: Medicare Other | Admitting: Family Medicine

## 2016-12-17 VITALS — BP 130/80 | HR 67 | Temp 97.8°F | Ht 71.25 in | Wt 226.0 lb

## 2016-12-17 VITALS — BP 130/80 | HR 69 | Temp 97.8°F | Ht 71.25 in | Wt 226.0 lb

## 2016-12-17 DIAGNOSIS — M1000 Idiopathic gout, unspecified site: Secondary | ICD-10-CM

## 2016-12-17 DIAGNOSIS — H9113 Presbycusis, bilateral: Secondary | ICD-10-CM

## 2016-12-17 DIAGNOSIS — E039 Hypothyroidism, unspecified: Secondary | ICD-10-CM | POA: Diagnosis not present

## 2016-12-17 DIAGNOSIS — Z0000 Encounter for general adult medical examination without abnormal findings: Secondary | ICD-10-CM

## 2016-12-17 DIAGNOSIS — Z125 Encounter for screening for malignant neoplasm of prostate: Secondary | ICD-10-CM

## 2016-12-17 DIAGNOSIS — N400 Enlarged prostate without lower urinary tract symptoms: Secondary | ICD-10-CM | POA: Diagnosis not present

## 2016-12-17 DIAGNOSIS — E786 Lipoprotein deficiency: Secondary | ICD-10-CM

## 2016-12-17 DIAGNOSIS — I10 Essential (primary) hypertension: Secondary | ICD-10-CM

## 2016-12-17 DIAGNOSIS — R739 Hyperglycemia, unspecified: Secondary | ICD-10-CM | POA: Diagnosis not present

## 2016-12-17 LAB — CBC WITH DIFFERENTIAL/PLATELET
BASOS ABS: 0.1 10*3/uL (ref 0.0–0.1)
Basophils Relative: 1 % (ref 0.0–3.0)
EOS ABS: 0.3 10*3/uL (ref 0.0–0.7)
Eosinophils Relative: 5 % (ref 0.0–5.0)
HCT: 45 % (ref 39.0–52.0)
Hemoglobin: 15.2 g/dL (ref 13.0–17.0)
LYMPHS ABS: 2.1 10*3/uL (ref 0.7–4.0)
LYMPHS PCT: 36.7 % (ref 12.0–46.0)
MCHC: 33.7 g/dL (ref 30.0–36.0)
MCV: 97.1 fl (ref 78.0–100.0)
MONO ABS: 0.5 10*3/uL (ref 0.1–1.0)
Monocytes Relative: 8.6 % (ref 3.0–12.0)
NEUTROS ABS: 2.7 10*3/uL (ref 1.4–7.7)
NEUTROS PCT: 48.7 % (ref 43.0–77.0)
PLATELETS: 221 10*3/uL (ref 150.0–400.0)
RBC: 4.63 Mil/uL (ref 4.22–5.81)
RDW: 14 % (ref 11.5–15.5)
WBC: 5.6 10*3/uL (ref 4.0–10.5)

## 2016-12-17 LAB — COMPREHENSIVE METABOLIC PANEL
ALK PHOS: 107 U/L (ref 39–117)
ALT: 11 U/L (ref 0–53)
AST: 19 U/L (ref 0–37)
Albumin: 4.6 g/dL (ref 3.5–5.2)
BILIRUBIN TOTAL: 0.8 mg/dL (ref 0.2–1.2)
BUN: 13 mg/dL (ref 6–23)
CALCIUM: 10.3 mg/dL (ref 8.4–10.5)
CO2: 32 meq/L (ref 19–32)
CREATININE: 0.95 mg/dL (ref 0.40–1.50)
Chloride: 101 mEq/L (ref 96–112)
GFR: 81.02 mL/min (ref >60.00)
Glucose, Bld: 97 mg/dL (ref 70–99)
Potassium: 5.3 mEq/L — ABNORMAL HIGH (ref 3.5–5.1)
Sodium: 136 mEq/L (ref 135–145)
TOTAL PROTEIN: 7.2 g/dL (ref 6.0–8.3)

## 2016-12-17 LAB — HEMOGLOBIN A1C: Hgb A1c MFr Bld: 5.2 % (ref 4.6–6.5)

## 2016-12-17 LAB — URIC ACID: URIC ACID, SERUM: 4.7 mg/dL (ref 4.0–7.8)

## 2016-12-17 LAB — PSA, MEDICARE: PSA: 0.56 ng/ml (ref 0.10–4.00)

## 2016-12-17 LAB — TSH: TSH: 6.31 u[IU]/mL — ABNORMAL HIGH (ref 0.35–4.50)

## 2016-12-17 MED ORDER — LEVOTHYROXINE SODIUM 50 MCG PO TABS: 50.0000 ug | ORAL_TABLET | Freq: Every day | ORAL | 3 refills | Status: DC

## 2016-12-17 MED ORDER — ALLOPURINOL 100 MG PO TABS: 100.0000 mg | ORAL_TABLET | Freq: Every day | ORAL | 3 refills | Status: DC

## 2016-12-17 MED ORDER — LISINOPRIL 20 MG PO TABS: 20.0000 mg | ORAL_TABLET | Freq: Every day | ORAL | 3 refills | Status: DC

## 2016-12-17 NOTE — Progress Notes (Signed)
Pre visit review using our clinic review tool, if applicable. No additional management support is needed unless otherwise documented below in the visit note. 

## 2016-12-17 NOTE — Progress Notes (Signed)
Subjective:    Patient ID: Jamie Byrd, male    DOB: 1934-08-07, 83 y.o.   MRN: [id number]  HPI Here for f/u of chronic medical problems  Not doing a lot these days  He stays busy   Wt Readings from Last 3 Encounters:  12/17/16 226 lb (102.5 kg)  12/17/16 226 lb (102.5 kg)  12/14/15 222 lb 12 oz (101 kg)  taking care of himself  Healthy diet  Wt is up 4 lb  Exercise - walks for 30 minutes every day (when not raining)  bmi 31.3  Seen for AMW today  Failed hearing - aware of hearing loss/ declines further eval or hearing aide at this time  One fall w/o injury (slipped on the ice and no problems)  No fractures  Will stay off the ice in the future and could not see the ice well  No other concerns  Macular degeneration- getting worse  opthy is watching  He may look into a new program at [facility] next month   bp is stable today  No cp or palpitations or headaches or edema  No side effects to medicines  BP Readings from Last 3 Encounters:  12/17/16 130/80  12/17/16 130/80  12/14/15 128/80      Hypothyroidism  Pt has no clinical changes No change in energy level/ hair or skin/ edema and no tremor Lab Results  Component Value Date   TSH 4.86 (H) 12/13/2015      BPH  Lab Results  Component Value Date   PSA 0.48 12/13/2015   PSA 0.55 12/11/2014   PSA 0.52 12/09/2013   pt states no changes at all  2-3 times nocturia at night  Flow during the day is fairly good-no changes  Declines medication for this currently   No gout flares  Takes allopurinol - and that works well Pending uric acid level  Hyperglycemia  Lab Results  Component Value Date   HGBA1C 5.3 12/13/2015  he is trying to wean off of sweets (he really likes chocolate)  His drinks are mostly diet     Labs done today - no results yet (was this am)   Hx of low HDL Lab Results  Component Value Date   CHOL 126 12/13/2015   HDL 28.60 (L) 12/13/2015   LDLCALC 68 12/13/2015   TRIG 150.0 (H)  12/13/2015   CHOLHDL 4 12/13/2015  he eats a little bit of fatty food Is exercising  No fish oil   Saw derm and had AKs frozen on hands   Patient Active Problem List   Diagnosis Date Noted  . Memory loss, short term 12/14/2015  . Hearing loss of aging 12/09/2013  . Encounter for Medicare annual wellness exam 12/09/2013  . Personal history of colonic polyps 12/08/2012  . Tinea versicolor 09/17/2011  . Prostate cancer screening 09/10/2011  . Hyperglycemia 09/25/2010  . Low HDL (under 40) 11/05/2009  . Hypothyroidism 07/28/2007  . Gout 07/28/2007  . ERECTILE DYSFUNCTION 07/28/2007  . Essential hypertension 07/28/2007  . Benign prostatic hyperplasia 07/28/2007   Past Medical History:  Diagnosis Date  . BPH (benign prostatic hypertrophy)   . Difficulty reading   . Difficulty seeing    Difficulty in seeing certain things and reading/ not legally blind!  . Gout   . History of shingles   . Hypertension   . Hypothyroidism   . Macular degeneration    Past Surgical History:  Procedure Laterality Date  . COLONOSCOPY    .  POLYPECTOMY     Social History  Substance Use Topics  . Smoking status: Former Smoker    Quit date: 10/13/1968  . Smokeless tobacco: Never Used  . Alcohol use No     Comment: no alcohol in 20 years   Family History  Problem Relation Age of Onset  . Heart disease Mother     CABG  . Heart disease Father     CAD  . Cancer Father     lung CA  . Cancer Brother     lung CA   No Known Allergies Current Outpatient Prescriptions on File Prior to Visit  Medication Sig Dispense Refill  . colchicine 0.6 MG tablet Take 1 tablet (0.6 mg total) by mouth 2 (two) times daily as needed. 30 tablet 0   No current facility-administered medications on file prior to visit.     Review of Systems Review of Systems  Constitutional: Negative for fever, appetite change, fatigue and unexpected weight change.  Eyes: Negative for pain and visual disturbance.  ENT pos for  hearing loss  Respiratory: Negative for cough and shortness of breath.   Cardiovascular: Negative for cp or palpitations    Gastrointestinal: Negative for nausea, diarrhea and constipation.  Genitourinary: Negative for urgency and frequency.  Skin: Negative for pallor or rash   Neurological: Negative for weakness, light-headedness, numbness and headaches.  Hematological: Negative for adenopathy. Does not bruise/bleed easily.  Psychiatric/Behavioral: Negative for dysphoric mood. The patient is not nervous/anxious.         Objective:   Physical Exam  Constitutional: He appears well-developed and well-nourished. No distress.  Well appearing elderly male   HENT:  Head: Normocephalic and atraumatic.  Right Ear: External ear normal.  Left Ear: External ear normal.  Nose: Nose normal.  Mouth/Throat: Oropharynx is clear and moist.  Eyes: Conjunctivae and EOM are normal. Pupils are equal, round, and reactive to light. Right eye exhibits no discharge. Left eye exhibits no discharge. No scleral icterus.  Neck: Normal range of motion. Neck supple. No JVD present. Carotid bruit is not present. No thyromegaly present.  Cardiovascular: Normal rate, regular rhythm, normal heart sounds and intact distal pulses.  Exam reveals no gallop.   Pulmonary/Chest: Effort normal and breath sounds normal. No respiratory distress. He has no wheezes. He exhibits no tenderness.  Abdominal: Soft. Bowel sounds are normal. He exhibits no distension, no abdominal bruit and no mass. There is no tenderness.  Musculoskeletal: He exhibits no edema or tenderness.  Lymphadenopathy:    He has no cervical adenopathy.  Neurological: He is alert. He has normal reflexes. No cranial nerve deficit. He exhibits normal muscle tone. Coordination normal.  Skin: Skin is warm and dry. No rash noted. No erythema. No pallor.  AKs on forearms and trunk-some recently treated  Lentigines and solar aging diffusely  Psychiatric: He has a  normal mood and affect.          Assessment & Plan:   Problem List Items Addressed This Visit      Cardiovascular and Mediastinum   Essential hypertension - Primary    bp in fair control at this time  BP Readings from Last 1 Encounters:  12/17/16 130/80   No changes needed Disc lifstyle change with low sodium diet and exercise  Labs revi      Relevant Medications   lisinopril (PRINIVIL,ZESTRIL) 20 MG tablet     Endocrine   Hypothyroidism    TSH today  Adjust dose as needed  No clinical  changes         Nervous and Auditory   Hearing loss of aging    Pt continues to decline hearing aides         Genitourinary   Benign prostatic hyperplasia    PSA with labs today  No change in voiding symptoms  Pt is not interested in medication or other intervention         Other   Hyperglycemia    A1C today  disc imp of low glycemic diet and wt loss to prevent DM2       Low HDL (under 40)    Lab today  Disc imp of exercise  Also fish oil/eating oily fish

## 2016-12-17 NOTE — Progress Notes (Signed)
Subjective:   Jamie Byrd is a 83 y.o. male who presents for Medicare Annual/Subsequent preventive examination.  Review of Systems:  N/A Cardiac Risk Factors include: advanced age (>42men, >14 women);male gender;hypertension     Objective:    Vitals: BP 130/80 (BP Location: Right Arm, Patient Position: Sitting, Cuff Size: Normal)   Pulse 67   Temp 97.8 F (36.6 C) (Oral)   Ht 5' 11.25" (1.81 m) Comment: no shoes  Wt 226 lb (102.5 kg)   SpO2 97%   BMI 31.30 kg/m   Body mass index is 31.3 kg/m.  Tobacco History  Smoking Status  . Former Smoker  . Quit date: 10/13/1968  Smokeless Tobacco  . Never Used     Counseling given: No   Past Medical History:  Diagnosis Date  . BPH (benign prostatic hypertrophy)   . Difficulty reading   . Difficulty seeing    Difficulty in seeing certain things and reading/ not legally blind!  . Gout   . History of shingles   . Hypertension   . Hypothyroidism   . Macular degeneration    Past Surgical History:  Procedure Laterality Date  . COLONOSCOPY    . POLYPECTOMY     Family History  Problem Relation Age of Onset  . Heart disease Mother     CABG  . Heart disease Father     CAD  . Cancer Father     lung CA  . Cancer Brother     lung CA   History  Sexual Activity  . Sexual activity: No    Outpatient Encounter Prescriptions as of 12/17/2016  Medication Sig  . colchicine 0.6 MG tablet Take 1 tablet (0.6 mg total) by mouth 2 (two) times daily as needed.  . [DISCONTINUED] allopurinol (ZYLOPRIM) 100 MG tablet Take 1 tablet (100 mg total) by mouth daily.  . [DISCONTINUED] levothyroxine (SYNTHROID, LEVOTHROID) 50 MCG tablet Take 1 tablet (50 mcg total) by mouth daily.  . [DISCONTINUED] lisinopril (PRINIVIL,ZESTRIL) 20 MG tablet Take 1 tablet (20 mg total) by mouth daily.   No facility-administered encounter medications on file as of 12/17/2016.     Activities of Daily Living In your present state of health, do you have any  difficulty performing the following activities: 12/17/2016  Hearing? Y  Vision? Y  Difficulty concentrating or making decisions? N  Walking or climbing stairs? N  Dressing or bathing? N  Doing errands, shopping? Y  Preparing Food and eating ? N  Using the Toilet? N  In the past six months, have you accidently leaked urine? N  Do you have problems with loss of bowel control? N  Managing your Medications? N  Managing your Finances? N  Housekeeping or managing your Housekeeping? N  Some recent data might be hidden    Patient Care Team: Abner Greenspan, MD as PCP - General Jarome Matin, MD as Consulting Physician (Dermatology)   Assessment:     Hearing Screening   125Hz  250Hz  500Hz  1000Hz  2000Hz  3000Hz  4000Hz  6000Hz  8000Hz   Right ear:   40 0 0  0    Left ear:   0 0 0  0    Vision Screening Comments: Last vision in Nov 2017; future appt scheduled January 20, 2017   Exercise Activities and Dietary recommendations Current Exercise Habits: Home exercise routine, Type of exercise: walking, Time (Minutes): 30, Frequency (Times/Week): 7, Weekly Exercise (Minutes/Week): 210, Exercise limited by: None identified  Goals    . Exercise 3x per week (  30 min per time)          Starting 12/13/15, I will continue to walk up to 1 mile daily.     . Increase physical activity          Starting 12/17/2016, I will continue to walk at least 30 min daily.       Fall Risk Fall Risk  12/17/2016 12/13/2015 12/11/2014 12/09/2013  Falls in the past year? Yes No No No  Number falls in past yr: 1 - - -  Injury with Fall? No - - -  Risk for fall due to : - Impaired vision - -   Depression Screen PHQ 2/9 Scores 12/17/2016 12/13/2015 12/11/2014 12/09/2013  PHQ - 2 Score 0 0 0 0    Cognitive Function MMSE - Mini Mental State Exam 12/17/2016 12/13/2015  Orientation to time 5 5  Orientation to Place 5 5  Registration 3 3  Attention/ Calculation 0 0  Recall 3 0  Language- name 2 objects 0 0  Language- repeat 1 1    Language- follow 3 step command 3 3  Language- read & follow direction 0 1  Write a sentence 0 0  Copy design 0 0  Total score 20 18     PLEASE NOTE: A Mini-Cog screen was completed. Maximum score is 20. A value of 0 denotes this part of Folstein MMSE was not completed or the patient failed this part of the Mini-Cog screening.   Mini-Cog Screening Orientation to Time - Max 5 pts Orientation to Place - Max 5 pts Registration - Max 3 pts Recall - Max 3 pts Language Repeat - Max 1 pts Language Follow 3 Step Command - Max 3 pts     Immunization History  Administered Date(s) Administered  . Influenza Split 09/17/2011, 07/23/2012  . Influenza Whole 08/25/2006, 09/10/2009, 09/25/2010  . Influenza, Seasonal, Injecte, Preservative Fre 06/28/2015  . Influenza,inj,Quad PF,36+ Mos 07/07/2013, 07/19/2014, 08/08/2016  . Influenza-Unspecified 06/28/2015  . Pneumococcal Conjugate-13 12/11/2014  . Pneumococcal Polysaccharide-23 09/17/2011  . Td 07/14/1999, 09/10/2009   Screening Tests Health Maintenance  Topic Date Due  . TETANUS/TDAP  09/11/2019  . INFLUENZA VACCINE  Completed  . PNA vac Low Risk Adult  Completed      Plan:    I have personally reviewed and addressed the Medicare Annual Wellness questionnaire and have noted the following in the patient's chart:  A. Medical and social history B. Use of alcohol, tobacco or illicit drugs  C. Current medications and supplements D. Functional ability and status E.  Nutritional status F.  Physical activity G. Advance directives H. List of other physicians I.  Hospitalizations, surgeries, and ER visits in previous 12 months J.  [place] to include hearing, vision, cognitive, depression L. Referrals and appointments - none  In addition, I have reviewed and discussed with patient certain preventive protocols, quality metrics, and best practice recommendations. A written personalized care plan for preventive services as well  as general preventive health recommendations were provided to patient.  See attached scanned questionnaire for additional information.   Signed,   Lindell Noe, MHA, BS, LPN Health Coach

## 2016-12-17 NOTE — Patient Instructions (Signed)
Jamie Byrd , Thank you for taking time to come for your Medicare Wellness Visit. I appreciate your ongoing commitment to your health goals. Please review the following plan we discussed and let me know if I can assist you in the future.   These are the goals we discussed: Goals    . Exercise 3x per week (30 min per time)          Starting 12/13/15, I will continue to walk up to 1 mile daily.     . Increase physical activity          Starting 12/17/2016, I will continue to walk at least 30 min daily.        This is a list of the screening recommended for you and due dates:  Health Maintenance  Topic Date Due  . Tetanus Vaccine  09/11/2019  . Flu Shot  Completed  . Pneumonia vaccines  Completed   Preventive Care for Adults  A healthy lifestyle and preventive care can promote health and wellness. Preventive health guidelines for adults include the following key practices.  . A routine yearly physical is a good way to check with your health care provider about your health and preventive screening. It is a chance to share any concerns and updates on your health and to receive a thorough exam.  . Visit your dentist for a routine exam and preventive care every 6 months. Brush your teeth twice a day and floss once a day. Good oral hygiene prevents tooth decay and gum disease.  . The frequency of eye exams is based on your age, health, family medical history, use  of contact lenses, and other factors. Follow your health care provider's ecommendations for frequency of eye exams.  . Eat a healthy diet. Foods like vegetables, fruits, whole grains, low-fat dairy products, and lean protein foods contain the nutrients you need without too many calories. Decrease your intake of foods high in solid fats, added sugars, and salt. Eat the right amount of calories for you. Get information about a proper diet from your health care provider, if necessary.  . Regular physical exercise is one of the most  important things you can do for your health. Most adults should get at least 150 minutes of moderate-intensity exercise (any activity that increases your heart rate and causes you to sweat) each week. In addition, most adults need muscle-strengthening exercises on 2 or more days a week.  Silver Sneakers may be a benefit available to you. To determine eligibility, you may visit the website: www.silversneakers.com or contact program at (302)067-2047 Mon-Fri between 8AM-8PM.   . Maintain a healthy weight. The body mass index (BMI) is a screening tool to identify possible weight problems. It provides an estimate of body fat based on height and weight. Your health care provider can find your BMI and can help you achieve or maintain a healthy weight.   For adults 20 years and older: ? A BMI below 18.5 is considered underweight. ? A BMI of 18.5 to 24.9 is normal. ? A BMI of 25 to 29.9 is considered overweight. ? A BMI of 30 and above is considered obese.   . Maintain normal blood lipids and cholesterol levels by exercising and minimizing your intake of saturated fat. Eat a balanced diet with plenty of fruit and vegetables. Blood tests for lipids and cholesterol should begin at age 68 and be repeated every 5 years. If your lipid or cholesterol levels are high, you are over 50,  or you are at high risk for heart disease, you may need your cholesterol levels checked more frequently. Ongoing high lipid and cholesterol levels should be treated with medicines if diet and exercise are not working.  . If you smoke, find out from your health care provider how to quit. If you do not use tobacco, please do not start.  . If you choose to drink alcohol, please do not consume more than 2 drinks per day. One drink is considered to be 12 ounces (355 mL) of beer, 5 ounces (148 mL) of wine, or 1.5 ounces (44 mL) of liquor.  . If you are 35-34 years old, ask your health care provider if you should take aspirin to prevent  strokes.  . Use sunscreen. Apply sunscreen liberally and repeatedly throughout the day. You should seek shade when your shadow is shorter than you. Protect yourself by wearing long sleeves, pants, a wide-brimmed hat, and sunglasses year round, whenever you are outdoors.  . Once a month, do a whole body skin exam, using a mirror to look at the skin on your back. Tell your health care provider of new moles, moles that have irregular borders, moles that are larger than a pencil eraser, or moles that have changed in shape or color.

## 2016-12-17 NOTE — Assessment & Plan Note (Signed)
bp in fair control at this time  BP Readings from Last 1 Encounters:  12/17/16 130/80   No changes needed Disc lifstyle change with low sodium diet and exercise  Labs revi

## 2016-12-17 NOTE — Patient Instructions (Addendum)
Let us know if you want evaluation for hearing aides in the future  Lab results will be back tomorrow likely  Eat a healthy diet lower in sugar and refined carbohydrates Keep exercising and stay active (mentally and physically)  Make sure to drink enough water  I refilled medications  Continue eye doctor appointments

## 2016-12-17 NOTE — Progress Notes (Signed)
PCP notes:   Health maintenance:  No gaps identified.  Abnormal screenings:   Hearing - failed Fall risk - hx of fall without injury  Patient concerns:   None  Nurse concerns:  None  Next PCP appt:   12/17/16 @ 1000

## 2016-12-18 ENCOUNTER — Telehealth: Payer: Self-pay | Admitting: *Deleted

## 2016-12-18 MED ORDER — LEVOTHYROXINE SODIUM 75 MCG PO TABS: 75.0000 ug | ORAL_TABLET | Freq: Every day | ORAL | 3 refills | Status: DC

## 2016-12-18 NOTE — Assessment & Plan Note (Signed)
TSH today  Adjust dose as needed  No clinical changes

## 2016-12-18 NOTE — Assessment & Plan Note (Signed)
A1C today  disc imp of low glycemic diet and wt loss to prevent DM2  

## 2016-12-18 NOTE — Assessment & Plan Note (Signed)
PSA with labs today  No change in voiding symptoms  Pt is not interested in medication or other intervention

## 2016-12-18 NOTE — Telephone Encounter (Signed)
-----   Message from Abner Greenspan, MD sent at 12/18/2016  9:31 AM EST ----- Thyroid is a little off -please ask if he has missed doses?  If not please send in levothyroxine 75 mcg (he is currently on 50)  1 po qd #30 3 ref and re check tsh in 4-6 wk Also K is a little high-if he takes supplements with K- stop them  Add K to lab in 4-6 wk for hyperkalemia  Thanks

## 2016-12-18 NOTE — Assessment & Plan Note (Signed)
Lab today  Disc imp of exercise  Also fish oil/eating oily fish

## 2016-12-18 NOTE — Telephone Encounter (Signed)
Rx sent and lab appt scheduled  

## 2016-12-18 NOTE — Assessment & Plan Note (Signed)
Pt continues to decline hearing aides

## 2016-12-21 NOTE — Progress Notes (Signed)
I reviewed health advisor's note, was available for consultation, and agree with documentation and plan.  

## 2017-01-11 ENCOUNTER — Telehealth: Payer: Self-pay | Admitting: Family Medicine

## 2017-01-11 DIAGNOSIS — E875 Hyperkalemia: Secondary | ICD-10-CM

## 2017-01-11 DIAGNOSIS — E039 Hypothyroidism, unspecified: Secondary | ICD-10-CM

## 2017-01-11 NOTE — Telephone Encounter (Signed)
-----   Message from Ellamae Sia sent at 01/05/2017  3:41 PM EDT ----- Regarding: Lab orders for Thursday, 4.12.18 Lab orders for repeat labs

## 2017-01-20 DIAGNOSIS — H35423 Microcystoid degeneration of retina, bilateral: Secondary | ICD-10-CM | POA: Diagnosis not present

## 2017-01-20 DIAGNOSIS — H31092 Other chorioretinal scars, left eye: Secondary | ICD-10-CM | POA: Diagnosis not present

## 2017-01-20 DIAGNOSIS — H353134 Nonexudative age-related macular degeneration, bilateral, advanced atrophic with subfoveal involvement: Secondary | ICD-10-CM | POA: Diagnosis not present

## 2017-01-20 DIAGNOSIS — H43813 Vitreous degeneration, bilateral: Secondary | ICD-10-CM | POA: Diagnosis not present

## 2017-01-22 ENCOUNTER — Other Ambulatory Visit (INDEPENDENT_AMBULATORY_CARE_PROVIDER_SITE_OTHER): Payer: Medicare Other

## 2017-01-22 DIAGNOSIS — E875 Hyperkalemia: Secondary | ICD-10-CM

## 2017-01-22 DIAGNOSIS — E039 Hypothyroidism, unspecified: Secondary | ICD-10-CM

## 2017-01-22 LAB — BASIC METABOLIC PANEL
BUN: 13 mg/dL (ref 6–23)
CHLORIDE: 102 meq/L (ref 96–112)
CO2: 30 mEq/L (ref 19–32)
Calcium: 9.9 mg/dL (ref 8.4–10.5)
Creatinine, Ser: 1.05 mg/dL (ref 0.40–1.50)
GFR: 72.17 mL/min (ref >60.00)
Glucose, Bld: 102 mg/dL — ABNORMAL HIGH (ref 70–99)
POTASSIUM: 4.8 meq/L (ref 3.5–5.1)
Sodium: 137 mEq/L (ref 135–145)

## 2017-01-22 LAB — TSH: TSH: 4.67 u[IU]/mL — AB (ref 0.35–4.50)

## 2017-01-23 ENCOUNTER — Encounter: Payer: Self-pay | Admitting: *Deleted

## 2017-01-27 DIAGNOSIS — H53413 Scotoma involving central area, bilateral: Secondary | ICD-10-CM | POA: Diagnosis not present

## 2017-02-04 DIAGNOSIS — H53413 Scotoma involving central area, bilateral: Secondary | ICD-10-CM | POA: Diagnosis not present

## 2017-03-13 DIAGNOSIS — D485 Neoplasm of uncertain behavior of skin: Secondary | ICD-10-CM | POA: Diagnosis not present

## 2017-03-13 DIAGNOSIS — L821 Other seborrheic keratosis: Secondary | ICD-10-CM | POA: Diagnosis not present

## 2017-03-13 DIAGNOSIS — L812 Freckles: Secondary | ICD-10-CM | POA: Diagnosis not present

## 2017-03-13 DIAGNOSIS — L570 Actinic keratosis: Secondary | ICD-10-CM | POA: Diagnosis not present

## 2017-03-13 DIAGNOSIS — Z85828 Personal history of other malignant neoplasm of skin: Secondary | ICD-10-CM | POA: Diagnosis not present

## 2017-03-17 DIAGNOSIS — H02051 Trichiasis without entropian right upper eyelid: Secondary | ICD-10-CM | POA: Diagnosis not present

## 2017-03-23 ENCOUNTER — Telehealth: Payer: Self-pay | Admitting: Family Medicine

## 2017-03-23 ENCOUNTER — Emergency Department
Admission: EM | Admit: 2017-03-23 | Discharge: 2017-03-23 | Disposition: A | Discharge status: Home / Self Care | Payer: Medicare Other | Attending: Emergency Medicine | Admitting: Emergency Medicine

## 2017-03-23 DIAGNOSIS — Y929 Unspecified place or not applicable: Secondary | ICD-10-CM | POA: Insufficient documentation

## 2017-03-23 DIAGNOSIS — Z87891 Personal history of nicotine dependence: Secondary | ICD-10-CM | POA: Diagnosis not present

## 2017-03-23 DIAGNOSIS — I10 Essential (primary) hypertension: Secondary | ICD-10-CM | POA: Diagnosis not present

## 2017-03-23 DIAGNOSIS — Z79899 Other long term (current) drug therapy: Secondary | ICD-10-CM | POA: Diagnosis not present

## 2017-03-23 DIAGNOSIS — T161XXA Foreign body in right ear, initial encounter: Secondary | ICD-10-CM | POA: Insufficient documentation

## 2017-03-23 DIAGNOSIS — Y9389 Activity, other specified: Secondary | ICD-10-CM | POA: Diagnosis not present

## 2017-03-23 DIAGNOSIS — E039 Hypothyroidism, unspecified: Secondary | ICD-10-CM | POA: Insufficient documentation

## 2017-03-23 DIAGNOSIS — Y998 Other external cause status: Secondary | ICD-10-CM | POA: Insufficient documentation

## 2017-03-23 DIAGNOSIS — X58XXXA Exposure to other specified factors, initial encounter: Secondary | ICD-10-CM | POA: Insufficient documentation

## 2017-03-23 MED ORDER — CIPROFLOXACIN HCL 0.3 % OP SOLN: 2.0000 [drp] | OPHTHALMIC | 0 refills | Status: DC

## 2017-03-23 NOTE — Telephone Encounter (Signed)
Per chart review tab pt went to [facility] ED 03/23/17.

## 2017-03-23 NOTE — ED Provider Notes (Signed)
[facility] Emergency Department Provider Note ____________________________________________  Time seen: Approximately 4:42 PM  I have reviewed the triage vital signs and the nursing notes.   HISTORY  Chief Complaint Foreign Body in [place]    HPI Jamie Byrd is a 83 y.o. male who presents to the emergency department for evaluation and removal of a insect from his right ear. Patient states that while he was mowing this afternoon, a bug that he believes to be a [place] beetle flew into his ear. He states that he attempted to use a small twig to remove the bug, but was unable to. He states he cannot fill the bug moving at this time, but on the way here he did feel it.  Past Medical History:  Diagnosis Date  . BPH (benign prostatic hypertrophy)   . Difficulty reading   . Difficulty seeing    Difficulty in seeing certain things and reading/ not legally blind!  . Gout   . History of shingles   . Hypertension   . Hypothyroidism   . Macular degeneration     Patient Active Problem List   Diagnosis Date Noted  . Memory loss, short term 12/14/2015  . Hearing loss of aging 12/09/2013  . Encounter for Medicare annual wellness exam 12/09/2013  . Personal history of colonic polyps 12/08/2012  . Tinea versicolor 09/17/2011  . Prostate cancer screening 09/10/2011  . Hyperkalemia 09/25/2010  . Hyperglycemia 09/25/2010  . Low HDL (under 40) 11/05/2009  . Hypothyroidism 07/28/2007  . Gout 07/28/2007  . ERECTILE DYSFUNCTION 07/28/2007  . Essential hypertension 07/28/2007  . Benign prostatic hyperplasia 07/28/2007    Past Surgical History:  Procedure Laterality Date  . COLONOSCOPY    . POLYPECTOMY      Prior to Admission medications   Medication Sig Start Date End Date Taking? Authorizing Provider  allopurinol (ZYLOPRIM) 100 MG tablet Take 1 tablet (100 mg total) by mouth daily. 12/17/16   Tower, Wynelle Fanny, MD  ciprofloxacin (CILOXAN) 0.3 % ophthalmic solution  Place 2 drops into the right ear every 4 (four) hours while awake. 03/23/17   [place]onia Gluth, Johnette Abraham B, FNP  colchicine 0.6 MG tablet Take 1 tablet (0.6 mg total) by mouth 2 (two) times daily as needed. 02/17/14   Tower, Wynelle Fanny, MD  levothyroxine (SYNTHROID, LEVOTHROID) 75 MCG tablet Take 1 tablet (75 mcg total) by mouth daily. 12/18/16   Tower, Wynelle Fanny, MD  lisinopril (PRINIVIL,ZESTRIL) 20 MG tablet Take 1 tablet (20 mg total) by mouth daily. 12/17/16   Tower, Wynelle Fanny, MD    Allergies Patient has no known allergies.  Family History  Problem Relation Age of Onset  . Heart disease Mother        CABG  . Heart disease Father        CAD  . Cancer Father        lung CA  . Cancer Brother        lung CA    Social History Social History  Substance Use Topics  . Smoking status: Former Smoker    Quit date: 10/13/1968  . Smokeless tobacco: Never Used  . Alcohol use No     Comment: no alcohol in 20 years    Review of Systems Constitutional: Negative for recent illness ENT: Positive for foreign body in the right ear  Gastrointestinal: Negative for nausea or vomiting Skin: Negative for rash, lesion, or wound ____________________________________________   PHYSICAL EXAM:  VITAL SIGNS: ED Triage Vitals  Enc Vitals  Group     BP 03/23/17 1446 (!) 170/97     Pulse Rate 03/23/17 1446 87     Resp 03/23/17 1446 14     Temp 03/23/17 1446 97.9 F (36.6 C)     Temp Source 03/23/17 1446 Oral     SpO2 03/23/17 1446 96 %     Weight --      Height --      Head Circumference --      Peak Flow --      Pain Score 03/23/17 1445 0     Pain Loc --      Pain Edu? --      Excl. in [place]? --     Constitutional: Well appearing. Ears: Bright red blood noted in the EAC of the right ear with presence of foreign body that appears as an insect  Head: Atraumatic Nose: No rhinorrhea or nasal congestion Mouth/Throat: No dysphagia Cardiovascular: Scant amount of bright red blood noted in the EAC of the right ear,  otherwise no active bleeding Respiratory: No cough or shortness of breath observed  Neurologic:  Alert and oriented 4 Skin: No rash or lesion on exposed skin surfaces. ____________________________________________   LABS (all labs ordered are listed, but only abnormal results are displayed)  Labs Reviewed - No data to display ____________________________________________   RADIOLOGY  Not indicated ____________________________________________   PROCEDURES  Procedure(s) performed:  Foreign Body Removal:  Procedure explained and permission received from patient. Body Part: Right ear Anesthesia: None Supplies: Alligator forceps Technique: Direct visualization Procedure was successful Type of foreign body removed: Japanese beetle  Patient tolerated the procedure well with no immediate complications.   ____________________________________________   INITIAL IMPRESSION / ASSESSMENT AND PLAN / ED COURSE  83 year old male presenting to the emergency department for removal of an insect from the right ear. Procedure was successful. Tympanic membrane was intact after removal of the Japanese beetle. Patient was encouraged to follow up with his primary care provider for any symptoms of concern.  Pertinent labs & imaging results that were available during my care of the patient were reviewed by me and considered in my medical decision making (see chart for details). ____________________________________________   FINAL CLINICAL IMPRESSION(S) / ED DIAGNOSES  Final diagnoses:  Foreign body of right ear, initial encounter    Discharge Medication List as of 03/23/2017  4:09 PM    START taking these medications   Details  ciprofloxacin (CILOXAN) 0.3 % ophthalmic solution Place 2 drops into the right ear every 4 (four) hours while awake., Starting Mon 03/23/2017, Print        If controlled substance prescribed during this visit, 12 month history viewed on the [place] prior to  issuing an initial prescription for Schedule II or III opiod.   Note:  This document was prepared using Dragon voice recognition software and may include unintentional dictation errors.     Victorino Dike, FNP 03/23/17 1652    Orbie Pyo, MD 03/23/17 2112

## 2017-03-23 NOTE — ED Notes (Signed)
See triage note  Thinks he may have a bug in left ear  Possible japanese beetle

## 2017-03-23 NOTE — Telephone Encounter (Signed)
Patient Name: Jamie Byrd  DOB: 04-Jul-1934    Initial Comment Caller states a beetle went in ear a few mins ago; can't get it out;    Nurse Assessment  Nurse: Arthor Captain, RN, Margaret Date/Time (Eastern Time): 03/23/2017 2:10:55 PM  Confirm and document reason for call. If symptomatic, describe symptoms. ---Caller states that a beetle went into his ear and he cant get it out  Does the patient have any new or worsening symptoms? ---Yes  Will a triage be completed? ---Yes  Related visit to physician within the last 2 weeks? ---No  Does the PT have any chronic conditions? (i.e. diabetes, asthma, etc.) ---No  Is this a behavioral health or substance abuse call? ---No     Guidelines    Guideline Title Affirmed Question Affirmed Notes  Ear - Foreign Body Caller is unable to remove live insect    Final Disposition User   Go to ED Now [place], RN, [facility] - ED   Disagree/Comply: Comply

## 2017-03-23 NOTE — ED Triage Notes (Signed)
Pt reports japanese beetle in left ear today.

## 2017-05-27 ENCOUNTER — Other Ambulatory Visit: Payer: Self-pay | Admitting: Family Medicine

## 2017-07-16 ENCOUNTER — Ambulatory Visit (INDEPENDENT_AMBULATORY_CARE_PROVIDER_SITE_OTHER): Payer: Medicare Other

## 2017-07-16 DIAGNOSIS — Z23 Encounter for immunization: Secondary | ICD-10-CM

## 2017-07-21 DIAGNOSIS — H43813 Vitreous degeneration, bilateral: Secondary | ICD-10-CM | POA: Diagnosis not present

## 2017-07-21 DIAGNOSIS — H353134 Nonexudative age-related macular degeneration, bilateral, advanced atrophic with subfoveal involvement: Secondary | ICD-10-CM | POA: Diagnosis not present

## 2017-07-21 DIAGNOSIS — H35423 Microcystoid degeneration of retina, bilateral: Secondary | ICD-10-CM | POA: Diagnosis not present

## 2017-07-21 DIAGNOSIS — H35433 Paving stone degeneration of retina, bilateral: Secondary | ICD-10-CM | POA: Diagnosis not present

## 2017-07-23 ENCOUNTER — Ambulatory Visit: Payer: Medicare Other

## 2017-09-15 DIAGNOSIS — L812 Freckles: Secondary | ICD-10-CM | POA: Diagnosis not present

## 2017-09-15 DIAGNOSIS — L821 Other seborrheic keratosis: Secondary | ICD-10-CM | POA: Diagnosis not present

## 2017-09-15 DIAGNOSIS — C4441 Basal cell carcinoma of skin of scalp and neck: Secondary | ICD-10-CM | POA: Diagnosis not present

## 2017-09-15 DIAGNOSIS — Z85828 Personal history of other malignant neoplasm of skin: Secondary | ICD-10-CM | POA: Diagnosis not present

## 2017-09-15 DIAGNOSIS — L578 Other skin changes due to chronic exposure to nonionizing radiation: Secondary | ICD-10-CM | POA: Diagnosis not present

## 2017-09-15 DIAGNOSIS — D485 Neoplasm of uncertain behavior of skin: Secondary | ICD-10-CM | POA: Diagnosis not present

## 2017-09-15 DIAGNOSIS — L570 Actinic keratosis: Secondary | ICD-10-CM | POA: Diagnosis not present

## 2017-09-15 DIAGNOSIS — L918 Other hypertrophic disorders of the skin: Secondary | ICD-10-CM | POA: Diagnosis not present

## 2017-11-28 ENCOUNTER — Other Ambulatory Visit: Payer: Self-pay | Admitting: Family Medicine

## 2017-12-13 ENCOUNTER — Telehealth: Payer: Self-pay | Admitting: Family Medicine

## 2017-12-13 DIAGNOSIS — E786 Lipoprotein deficiency: Secondary | ICD-10-CM

## 2017-12-13 DIAGNOSIS — M1A9XX0 Chronic gout, unspecified, without tophus (tophi): Secondary | ICD-10-CM

## 2017-12-13 DIAGNOSIS — E039 Hypothyroidism, unspecified: Secondary | ICD-10-CM

## 2017-12-13 DIAGNOSIS — R739 Hyperglycemia, unspecified: Secondary | ICD-10-CM

## 2017-12-13 DIAGNOSIS — N400 Enlarged prostate without lower urinary tract symptoms: Secondary | ICD-10-CM

## 2017-12-13 DIAGNOSIS — I10 Essential (primary) hypertension: Secondary | ICD-10-CM

## 2017-12-13 NOTE — Telephone Encounter (Signed)
-----   Message from Eustace Pen, LPN sent at 02/11/8840  2:17 PM EST ----- Regarding: Labs 3/6 Lab orders needed. Thank you.  Insurance:  [employer]

## 2017-12-14 ENCOUNTER — Other Ambulatory Visit: Payer: Self-pay | Admitting: Family Medicine

## 2017-12-18 ENCOUNTER — Ambulatory Visit (INDEPENDENT_AMBULATORY_CARE_PROVIDER_SITE_OTHER): Payer: Medicare Other

## 2017-12-18 ENCOUNTER — Encounter: Payer: Self-pay | Admitting: Family Medicine

## 2017-12-18 ENCOUNTER — Ambulatory Visit: Payer: Medicare Other

## 2017-12-18 ENCOUNTER — Ambulatory Visit (INDEPENDENT_AMBULATORY_CARE_PROVIDER_SITE_OTHER): Payer: Medicare Other | Admitting: Family Medicine

## 2017-12-18 VITALS — BP 138/82 | HR 59 | Temp 97.5°F | Ht 71.0 in | Wt 222.0 lb

## 2017-12-18 DIAGNOSIS — Z86010 Personal history of colonic polyps: Secondary | ICD-10-CM | POA: Diagnosis not present

## 2017-12-18 DIAGNOSIS — N400 Enlarged prostate without lower urinary tract symptoms: Secondary | ICD-10-CM | POA: Diagnosis not present

## 2017-12-18 DIAGNOSIS — E039 Hypothyroidism, unspecified: Secondary | ICD-10-CM

## 2017-12-18 DIAGNOSIS — E786 Lipoprotein deficiency: Secondary | ICD-10-CM

## 2017-12-18 DIAGNOSIS — M1A9XX0 Chronic gout, unspecified, without tophus (tophi): Secondary | ICD-10-CM | POA: Diagnosis not present

## 2017-12-18 DIAGNOSIS — H9113 Presbycusis, bilateral: Secondary | ICD-10-CM

## 2017-12-18 DIAGNOSIS — Z125 Encounter for screening for malignant neoplasm of prostate: Secondary | ICD-10-CM

## 2017-12-18 DIAGNOSIS — Z0000 Encounter for general adult medical examination without abnormal findings: Secondary | ICD-10-CM

## 2017-12-18 DIAGNOSIS — H353130 Nonexudative age-related macular degeneration, bilateral, stage unspecified: Secondary | ICD-10-CM

## 2017-12-18 DIAGNOSIS — R739 Hyperglycemia, unspecified: Secondary | ICD-10-CM | POA: Diagnosis not present

## 2017-12-18 DIAGNOSIS — I10 Essential (primary) hypertension: Secondary | ICD-10-CM | POA: Diagnosis not present

## 2017-12-18 DIAGNOSIS — H3530 Unspecified macular degeneration: Secondary | ICD-10-CM | POA: Insufficient documentation

## 2017-12-18 LAB — CBC WITH DIFFERENTIAL/PLATELET
BASOS ABS: 0 10*3/uL (ref 0.0–0.1)
Basophils Relative: 0.8 % (ref 0.0–3.0)
Eosinophils Absolute: 0.4 10*3/uL (ref 0.0–0.7)
Eosinophils Relative: 6.6 % — ABNORMAL HIGH (ref 0.0–5.0)
HCT: 43.1 % (ref 39.0–52.0)
Hemoglobin: 14.9 g/dL (ref 13.0–17.0)
LYMPHS ABS: 2 10*3/uL (ref 0.7–4.0)
Lymphocytes Relative: 34.1 % (ref 12.0–46.0)
MCHC: 34.4 g/dL (ref 30.0–36.0)
MCV: 97.9 fl (ref 78.0–100.0)
MONO ABS: 0.4 10*3/uL (ref 0.1–1.0)
Monocytes Relative: 6.4 % (ref 3.0–12.0)
NEUTROS PCT: 52.1 % (ref 43.0–77.0)
Neutro Abs: 3.1 10*3/uL (ref 1.4–7.7)
Platelets: 192 10*3/uL (ref 150.0–400.0)
RBC: 4.4 Mil/uL (ref 4.22–5.81)
RDW: 14.2 % (ref 11.5–15.5)
WBC: 5.9 10*3/uL (ref 4.0–10.5)

## 2017-12-18 LAB — LIPID PANEL
CHOL/HDL RATIO: 4
Cholesterol: 119 mg/dL (ref 0–200)
HDL: 30.1 mg/dL — ABNORMAL LOW (ref >39.00)
LDL Cholesterol: 62 mg/dL (ref 0–99)
NonHDL: 88.85
TRIGLYCERIDES: 133 mg/dL (ref 0.0–149.0)
VLDL: 26.6 mg/dL (ref 0.0–40.0)

## 2017-12-18 LAB — COMPREHENSIVE METABOLIC PANEL
ALBUMIN: 4.5 g/dL (ref 3.5–5.2)
ALT: 11 U/L (ref 0–53)
AST: 19 U/L (ref 0–37)
Alkaline Phosphatase: 107 U/L (ref 39–117)
BUN: 10 mg/dL (ref 6–23)
CHLORIDE: 101 meq/L (ref 96–112)
CO2: 30 meq/L (ref 19–32)
CREATININE: 0.94 mg/dL (ref 0.40–1.50)
Calcium: 10.2 mg/dL (ref 8.4–10.5)
GFR: 81.81 mL/min (ref >60.00)
Glucose, Bld: 111 mg/dL — ABNORMAL HIGH (ref 70–99)
Potassium: 5.6 mEq/L — ABNORMAL HIGH (ref 3.5–5.1)
SODIUM: 136 meq/L (ref 135–145)
Total Bilirubin: 0.8 mg/dL (ref 0.2–1.2)
Total Protein: 7 g/dL (ref 6.0–8.3)

## 2017-12-18 LAB — TSH: TSH: 4.73 u[IU]/mL — AB (ref 0.35–4.50)

## 2017-12-18 LAB — PSA, MEDICARE: PSA: 0.59 ng/ml (ref 0.10–4.00)

## 2017-12-18 LAB — HEMOGLOBIN A1C: Hgb A1c MFr Bld: 5.1 % (ref 4.6–6.5)

## 2017-12-18 LAB — URIC ACID: URIC ACID, SERUM: 4.6 mg/dL (ref 4.0–7.8)

## 2017-12-18 MED ORDER — ALLOPURINOL 100 MG PO TABS: 100.0000 mg | ORAL_TABLET | Freq: Every day | ORAL | 3 refills | Status: DC

## 2017-12-18 MED ORDER — LISINOPRIL 20 MG PO TABS: 20.0000 mg | ORAL_TABLET | Freq: Every day | ORAL | 3 refills | Status: DC

## 2017-12-18 NOTE — Patient Instructions (Signed)
Keep wearing a hat for sun protection Also sun block on face/neck/ears and forearms (where ever you are exposed)   Make sure to drink enough fluids 64 oz of fluids daily -mostly water   Keep busy  Keep walking  Keep taking care of yourself   Pending lab result

## 2017-12-18 NOTE — Assessment & Plan Note (Signed)
Lipid panel today  Disc goals for lipids and reasons to control them Rev labs with pt  (last year) Rev low sat fat diet in detail  Disc exercise and omega 3 to help HDL

## 2017-12-18 NOTE — Progress Notes (Signed)
PCP notes:   Health maintenance:  No gaps identified.  Abnormal screenings:   Hearing - failed  Hearing Screening   125Hz  250Hz  500Hz  1000Hz  2000Hz  3000Hz  4000Hz  6000Hz  8000Hz   Right ear:   40 0 0  0    Left ear:   0 0 0  0     Patient concerns:   None  Nurse concerns:  None  Next PCP appt:   12/18/17 @ 1045  I reviewed health advisor's note, was available for consultation, and agree with documentation and plan. Loura Pardon MD

## 2017-12-18 NOTE — Assessment & Plan Note (Signed)
No flares as long as he stays on allopurinol Watches diet  Lab today -uric acid/renal/hep fxn

## 2017-12-18 NOTE — Assessment & Plan Note (Signed)
bp in fair control at this time (has been labile in the past)  BP Readings from Last 1 Encounters:  12/18/17 138/82   No changes needed Disc lifstyle change with low sodium diet and exercise  Labs ordered today  Wt loss enc

## 2017-12-18 NOTE — Progress Notes (Signed)
Subjective:   Jamie Byrd is a 84 y.o. male who presents for Medicare Annual/Subsequent preventive examination.  Review of Systems:  N/A Cardiac Risk Factors include: advanced age (>23men, >71 women);male gender;hypertension     Objective:    Vitals: BP 138/82 (BP Location: Right Arm, Patient Position: Sitting, Cuff Size: Normal)   Pulse (!) 59   Temp (!) 97.5 F (36.4 C) (Oral)   Ht 5\' 11"  (1.803 m) Comment: no shoes  Wt 222 lb (100.7 kg)   SpO2 98%   BMI 30.96 kg/m   Body mass index is 30.96 kg/m.  Advanced Directives 12/18/2017 03/23/2017 12/17/2016 12/13/2015  Does Patient Have a Medical Advance Directive? No No No No  Would patient like information on creating a medical advance directive? Yes (MAU/Ambulatory/Procedural Areas - Information given) No - Patient declined - No - patient declined information    Tobacco Social History   Tobacco Use  Smoking Status Former Smoker  . Last attempt to quit: 10/13/1968  . Years since quitting: 49.2  Smokeless Tobacco Never Used     Counseling given: No   Clinical Intake:  Pre-visit preparation completed: Yes  Pain : No/denies pain Pain Score: 0-No pain     Nutritional Status: BMI > 30  Obese Nutritional Risks: None Diabetes: No  How often do you need to have someone help you when you read instructions, pamphlets, or other written materials from your doctor or pharmacy?: 1 - Never What is the last grade level you completed in school?: 5th grade  Interpreter Needed?: No  Comments: pt lives with spouse Information entered by :: LPinson, LPN  Past Medical History:  Diagnosis Date  . BPH (benign prostatic hypertrophy)   . Difficulty reading   . Difficulty seeing    Difficulty in seeing certain things and reading/ not legally blind!  . Gout   . History of shingles   . Hypertension   . Hypothyroidism   . Macular degeneration    Past Surgical History:  Procedure Laterality Date  . COLONOSCOPY    . POLYPECTOMY      Family History  Problem Relation Age of Onset  . Heart disease Mother        CABG  . Heart disease Father        CAD  . Cancer Father        lung CA  . Cancer Brother        lung CA   Social History   Socioeconomic History  . Marital status: Married    Spouse name: None  . Number of children: None  . Years of education: None  . Highest education level: None  Social Needs  . Financial resource strain: None  . Food insecurity - worry: None  . Food insecurity - inability: None  . Transportation needs - medical: None  . Transportation needs - non-medical: None  Occupational History  . None  Tobacco Use  . Smoking status: Former Smoker    Last attempt to quit: 10/13/1968    Years since quitting: 49.2  . Smokeless tobacco: Never Used  Substance and Sexual Activity  . Alcohol use: No    Comment: no alcohol in 20 years  . Drug use: No  . Sexual activity: No  Other Topics Concern  . None  Social History Narrative  . None    Outpatient Encounter Medications as of 12/18/2017  Medication Sig  . allopurinol (ZYLOPRIM) 100 MG tablet TAKE 1 TABLET BY MOUTH ONCE A DAY  .  levothyroxine (SYNTHROID, LEVOTHROID) 75 MCG tablet TAKE 1 TABLET BY MOUTH ONCE DAILY  . lisinopril (PRINIVIL,ZESTRIL) 20 MG tablet TAKE 1 TABLET BY MOUTH ONCE A DAY  . [DISCONTINUED] ciprofloxacin (CILOXAN) 0.3 % ophthalmic solution Place 2 drops into the right ear every 4 (four) hours while awake.  . [DISCONTINUED] colchicine 0.6 MG tablet Take 1 tablet (0.6 mg total) by mouth 2 (two) times daily as needed.   No facility-administered encounter medications on file as of 12/18/2017.     Activities of Daily Living In your present state of health, do you have any difficulty performing the following activities: 12/18/2017  Hearing? N  Vision? Y  Difficulty concentrating or making decisions? N  Walking or climbing stairs? N  Dressing or bathing? N  Doing errands, shopping? N  Preparing Food and eating ? N    Using the Toilet? N  In the past six months, have you accidently leaked urine? N  Do you have problems with loss of bowel control? N  Managing your Medications? N  Managing your Finances? N  Housekeeping or managing your Housekeeping? N  Some recent data might be hidden    Patient Care Team: Tower, Wynelle Fanny, MD as PCP - Kennon Portela, MD as Consulting Physician (Dermatology)   Assessment:   This is a routine wellness examination for Andri.   Hearing Screening   125Hz  250Hz  500Hz  1000Hz  2000Hz  3000Hz  4000Hz  6000Hz  8000Hz   Right ear:   40 0 0  0    Left ear:   0 0 0  0    Vision Screening Comments: Last vision exam in Dec 2018 with Dr. Tye Savoy  Exercise Activities and Dietary recommendations Current Exercise Habits: Home exercise routine, Type of exercise: walking, Time (Minutes): 30, Frequency (Times/Week): 7, Weekly Exercise (Minutes/Week): 210, Intensity: Mild, Exercise limited by: None identified  Goals    . Exercise 3x per week (30 min per time)     Starting 12/18/2017, I will continue to walk up to 1/2 mile daily.        Fall Risk Fall Risk  12/18/2017 12/17/2016 12/13/2015 12/11/2014 12/09/2013  Falls in the past year? No Yes No No No  Comment - pt fell while walking on ice; no medical treatment - - -  Number falls in past yr: - 1 - - -  Injury with Fall? - No - - -  Risk for fall due to : - - Impaired vision - -   Depression Screen PHQ 2/9 Scores 12/18/2017 12/17/2016 12/13/2015 12/11/2014  PHQ - 2 Score 0 0 0 0  PHQ- 9 Score 0 - - -    Cognitive Function MMSE - Mini Mental State Exam 12/18/2017 12/17/2016 12/13/2015  Orientation to time 5 5 5   Orientation to Place 5 5 5   Registration 3 3 3   Attention/ Calculation 0 0 0  Recall 3 3 0  Language- name 2 objects 0 0 0  Language- repeat 1 1 1   Language- follow 3 step command 3 3 3   Language- read & follow direction 0 0 1  Write a sentence 0 0 0  Copy design 0 0 0  Total score 20 20 18      PLEASE NOTE: A Mini-Cog screen  was completed. Maximum score is 20. A value of 0 denotes this part of Folstein MMSE was not completed or the patient failed this part of the Mini-Cog screening.   Mini-Cog Screening Orientation to Time - Max 5 pts Orientation to Place - Max 5  pts Registration - Max 3 pts Recall - Max 3 pts Language Repeat - Max 1 pts Language Follow 3 Step Command - Max 3 pts     Immunization History  Administered Date(s) Administered  . Influenza Split 09/17/2011, 07/23/2012  . Influenza Whole 08/25/2006, 09/10/2009, 09/25/2010  . Influenza, Seasonal, Injecte, Preservative Fre 06/28/2015  . Influenza,inj,Quad PF,6+ Mos 07/07/2013, 07/19/2014, 08/08/2016, 07/16/2017  . Influenza-Unspecified 06/28/2015  . Pneumococcal Conjugate-13 12/11/2014  . Pneumococcal Polysaccharide-23 09/17/2011  . Td 07/14/1999, 09/10/2009    Screening Tests Health Maintenance  Topic Date Due  . TETANUS/TDAP  09/11/2019  . INFLUENZA VACCINE  Completed  . PNA vac Low Risk Adult  Completed    Plan:     I have personally reviewed, addressed, and noted the following in the patient's chart:  A. Medical and social history B. Use of alcohol, tobacco or illicit drugs  C. Current medications and supplements D. Functional ability and status E.  Nutritional status F.  Physical activity G. Advance directives H. List of other physicians I.  Hospitalizations, surgeries, and ER visits in previous 12 months J.  [place] to include hearing, vision, cognitive, depression L. Referrals and appointments - none  In addition, I have reviewed and discussed with patient certain preventive protocols, quality metrics, and best practice recommendations. A written personalized care plan for preventive services as well as general preventive health recommendations were provided to patient.  See attached scanned questionnaire for additional information.   Signed,   Lindell Noe, MHA, BS, LPN Health Coach

## 2017-12-18 NOTE — Assessment & Plan Note (Signed)
A1C today Eats a fair amt of sweets disc imp of low glycemic diet and wt loss to prevent DM2

## 2017-12-18 NOTE — Assessment & Plan Note (Signed)
No clinical changes  psa today  Nocturia times 2

## 2017-12-18 NOTE — Assessment & Plan Note (Signed)
TSH today  No missed doses  Will not refill levothy until result returns Pt has no clinical changes

## 2017-12-18 NOTE — Patient Instructions (Signed)
Jamie Byrd , Thank you for taking time to come for your Medicare Wellness Visit. I appreciate your ongoing commitment to your health goals. Please review the following plan we discussed and let me know if I can assist you in the future.   These are the goals we discussed: Goals    . Exercise 3x per week (30 min per time)     Starting 12/18/2017, I will continue to walk up to 1/2 mile daily.        This is a list of the screening recommended for you and due dates:  Health Maintenance  Topic Date Due  . Tetanus Vaccine  09/11/2019  . Flu Shot  Completed  . Pneumonia vaccines  Completed   Preventive Care for Adults  A healthy lifestyle and preventive care can promote health and wellness. Preventive health guidelines for adults include the following key practices.  . A routine yearly physical is a good way to check with your health care provider about your health and preventive screening. It is a chance to share any concerns and updates on your health and to receive a thorough exam.  . Visit your dentist for a routine exam and preventive care every 6 months. Brush your teeth twice a day and floss once a day. Good oral hygiene prevents tooth decay and gum disease.  . The frequency of eye exams is based on your age, health, family medical history, use  of contact lenses, and other factors. Follow your health care provider's recommendations for frequency of eye exams.  . Eat a healthy diet. Foods like vegetables, fruits, whole grains, low-fat dairy products, and lean protein foods contain the nutrients you need without too many calories. Decrease your intake of foods high in solid fats, added sugars, and salt. Eat the right amount of calories for you. Get information about a proper diet from your health care provider, if necessary.  . Regular physical exercise is one of the most important things you can do for your health. Most adults should get at least 150 minutes of moderate-intensity  exercise (any activity that increases your heart rate and causes you to sweat) each week. In addition, most adults need muscle-strengthening exercises on 2 or more days a week.  Silver Sneakers may be a benefit available to you. To determine eligibility, you may visit the website: www.silversneakers.com or contact program at 7820244084 Mon-Fri between 8AM-8PM.   . Maintain a healthy weight. The body mass index (BMI) is a screening tool to identify possible weight problems. It provides an estimate of body fat based on height and weight. Your health care provider can find your BMI and can help you achieve or maintain a healthy weight.   For adults 20 years and older: ? A BMI below 18.5 is considered underweight. ? A BMI of 18.5 to 24.9 is normal. ? A BMI of 25 to 29.9 is considered overweight. ? A BMI of 30 and above is considered obese.   . Maintain normal blood lipids and cholesterol levels by exercising and minimizing your intake of saturated fat. Eat a balanced diet with plenty of fruit and vegetables. Blood tests for lipids and cholesterol should begin at age 60 and be repeated every 5 years. If your lipid or cholesterol levels are high, you are over 50, or you are at high risk for heart disease, you may need your cholesterol levels checked more frequently. Ongoing high lipid and cholesterol levels should be treated with medicines if diet and exercise are  not working.  . If you smoke, find out from your health care provider how to quit. If you do not use tobacco, please do not start.  . If you choose to drink alcohol, please do not consume more than 2 drinks per day. One drink is considered to be 12 ounces (355 mL) of beer, 5 ounces (148 mL) of wine, or 1.5 ounces (44 mL) of liquor.  . If you are 37-67 years old, ask your health care provider if you should take aspirin to prevent strokes.  . Use sunscreen. Apply sunscreen liberally and repeatedly throughout the day. You should seek shade  when your shadow is shorter than you. Protect yourself by wearing long sleeves, pants, a wide-brimmed hat, and sunglasses year round, whenever you are outdoors.  . Once a month, do a whole body skin exam, using a mirror to look at the skin on your back. Tell your health care provider of new moles, moles that have irregular borders, moles that are larger than a pencil eraser, or moles that have changed in shape or color.

## 2017-12-18 NOTE — Assessment & Plan Note (Signed)
Pt continues opthy f/u  This is progressive /frustrating  No longer drives  Disc safety

## 2017-12-18 NOTE — Assessment & Plan Note (Signed)
Declines hearing aides at this time

## 2017-12-18 NOTE — Assessment & Plan Note (Signed)
Last colonoscopy ok in 2014

## 2017-12-18 NOTE — Progress Notes (Signed)
Subjective:    Patient ID: Jamie Byrd, male    DOB: December 31, 1933, 84 y.o.   MRN: [id number]  HPI Here for annual f/u of chronic medical problems  Had amw with Lesia today  Failed hearing-pt aware  Not interested in hearing tx   Wt Readings from Last 3 Encounters:  12/18/17 222 lb (100.7 kg)  12/18/17 222 lb (100.7 kg)  12/17/16 226 lb (102.5 kg)  down 4 lb  Walks every am for close to 30 minutes - no problems with that/not out of breath  Eats about the same / balanced  Eats what he wants  30.96 kg/m   Macular degeneration-still getting worse  Frustrating to him Has not driven in 2 years   Colonoscopy 4/14 nl (neg bx)  No pain  No diarrhea / or constipation    bp is stable today (tends to be labile)  Takes lisinopril  No cp or palpitations or headaches or edema  No side effects to medicines  BP Readings from Last 3 Encounters:  12/18/17 138/82  12/18/17 138/82  03/23/17 (!) 170/97      Hypothyroidism  Pt has no clinical changes- no difference  No change in energy level/ hair or skin/ edema and no tremor Lab Results  Component Value Date   TSH 4.67 (H) 01/22/2017    Due for labs today   H/o BPH Lab Results  Component Value Date   PSA 0.56 12/17/2016   PSA 0.48 12/13/2015   PSA 0.55 12/11/2014   nocturia 2  (nl for him) -not bothersome/can go back to sleep  About the same   occ he passes a little stool with gas  Will keep an eye on that   Hyperglycemia- due for A1C Lab Results  Component Value Date   HGBA1C 5.2 12/17/2016  he does eat too many sweets  Likes ice cream and cookies    Cholesterol  Lab Results  Component Value Date   CHOL 126 12/13/2015   HDL 28.60 (L) 12/13/2015   LDLCALC 68 12/13/2015   TRIG 150.0 (H) 12/13/2015   CHOLHDL 4 12/13/2015   Lab today   Takes allopuinol for gout  No flares at all with the medication   Skin care Had a mole on his neck removed in the fall - was abnormal / no further treatment needed  Does  not wear sunblock    Patient Active Problem List   Diagnosis Date Noted  . Macular degeneration 12/18/2017  . Memory loss, short term 12/14/2015  . Hearing loss of aging 12/09/2013  . Encounter for Medicare annual wellness exam 12/09/2013  . Personal history of colonic polyps 12/08/2012  . Tinea versicolor 09/17/2011  . Prostate cancer screening 09/10/2011  . Hyperkalemia 09/25/2010  . Hyperglycemia 09/25/2010  . Low HDL (under 40) 11/05/2009  . Hypothyroidism 07/28/2007  . Gout 07/28/2007  . ERECTILE DYSFUNCTION 07/28/2007  . Essential hypertension 07/28/2007  . Benign prostatic hyperplasia 07/28/2007   Past Medical History:  Diagnosis Date  . BPH (benign prostatic hypertrophy)   . Difficulty reading   . Difficulty seeing    Difficulty in seeing certain things and reading/ not legally blind!  . Gout   . History of shingles   . Hypertension   . Hypothyroidism   . Macular degeneration    Past Surgical History:  Procedure Laterality Date  . COLONOSCOPY    . POLYPECTOMY     Social History   Tobacco Use  . Smoking status: Former Smoker  Last attempt to quit: 10/13/1968    Years since quitting: 49.2  . Smokeless tobacco: Never Used  Substance Use Topics  . Alcohol use: No    Comment: no alcohol in 20 years  . Drug use: No   Family History  Problem Relation Age of Onset  . Heart disease Mother        CABG  . Heart disease Father        CAD  . Cancer Father        lung CA  . Cancer Brother        lung CA   No Known Allergies Current Outpatient Medications on File Prior to Visit  Medication Sig Dispense Refill  . levothyroxine (SYNTHROID, LEVOTHROID) 75 MCG tablet TAKE 1 TABLET BY MOUTH ONCE DAILY 30 tablet 0   No current facility-administered medications on file prior to visit.     Review of Systems  Constitutional: Negative for activity change, appetite change, fatigue, fever and unexpected weight change.  HENT: Positive for hearing loss. Negative for  congestion, ear discharge, ear pain, rhinorrhea, sore throat and trouble swallowing.   Eyes: Positive for visual disturbance. Negative for pain, redness and itching.  Respiratory: Negative for cough, chest tightness, shortness of breath and wheezing.   Cardiovascular: Negative for chest pain and palpitations.  Gastrointestinal: Negative for abdominal pain, blood in stool, constipation, diarrhea and nausea.  Endocrine: Negative for cold intolerance, heat intolerance, polydipsia and polyuria.  Genitourinary: Negative for difficulty urinating, dysuria, frequency and urgency.  Musculoskeletal: Negative for arthralgias, joint swelling and myalgias.  Skin: Negative for pallor and rash.  Neurological: Negative for dizziness, tremors, weakness, numbness and headaches.  Hematological: Negative for adenopathy. Does not bruise/bleed easily.  Psychiatric/Behavioral: Negative for decreased concentration and dysphoric mood. The patient is not nervous/anxious.        Objective:   Physical Exam  Constitutional: He appears well-developed and well-nourished. No distress.  obese and well appearing   HENT:  Head: Normocephalic and atraumatic.  Right Ear: External ear normal.  Left Ear: External ear normal.  Nose: Nose normal.  Mouth/Throat: Oropharynx is clear and moist.  Eyes: Conjunctivae and EOM are normal. Pupils are equal, round, and reactive to light. Right eye exhibits no discharge. Left eye exhibits no discharge. No scleral icterus.  Neck: Normal range of motion. Neck supple. No JVD present. Carotid bruit is not present. No thyromegaly present.  Cardiovascular: Normal rate, regular rhythm, normal heart sounds and intact distal pulses. Exam reveals no gallop.  Pulmonary/Chest: Effort normal and breath sounds normal. No respiratory distress. He has no wheezes. He exhibits no tenderness.  Abdominal: Soft. Bowel sounds are normal. He exhibits no distension, no abdominal bruit and no mass. There is no  tenderness.  Musculoskeletal: He exhibits no edema or tenderness.  No acute joint changes   Lymphadenopathy:    He has no cervical adenopathy.  Neurological: He is alert. He has normal reflexes. No cranial nerve deficit. He exhibits normal muscle tone. Coordination normal.  No tremor   Skin: Skin is warm and dry. No rash noted. No erythema. No pallor.  Solar lentigines diffusely Many SKS  Some aks on forearms   Psychiatric: He has a normal mood and affect.          Assessment & Plan:   Problem List Items Addressed This Visit      Cardiovascular and Mediastinum   Essential hypertension - Primary    bp in fair control at this time (has been labile  in the past)  BP Readings from Last 1 Encounters:  12/18/17 138/82   No changes needed Disc lifstyle change with low sodium diet and exercise  Labs ordered today  Wt loss enc        Relevant Medications   lisinopril (PRINIVIL,ZESTRIL) 20 MG tablet     Endocrine   Hypothyroidism    TSH today  No missed doses  Will not refill levothy until result returns Pt has no clinical changes         Nervous and Auditory   Hearing loss of aging    Declines hearing aides at this time        Genitourinary   Benign prostatic hyperplasia    No clinical changes  psa today  Nocturia times 2        Other   Gout    No flares as long as he stays on allopurinol Watches diet  Lab today -uric acid/renal/hep fxn      Hyperglycemia    A1C today Eats a fair amt of sweets disc imp of low glycemic diet and wt loss to prevent DM2        Low HDL (under 40)    Lipid panel today  Disc goals for lipids and reasons to control them Rev labs with pt  (last year) Rev low sat fat diet in detail  Disc exercise and omega 3 to help HDL       Macular degeneration    Pt continues opthy f/u  This is progressive /frustrating  No longer drives  Disc safety      Personal history of colonic polyps    Last colonoscopy ok in 2014

## 2017-12-29 ENCOUNTER — Telehealth: Payer: Self-pay | Admitting: Family Medicine

## 2017-12-29 NOTE — Telephone Encounter (Signed)
Patient calling because levothyroxine (SYNTHROID, LEVOTHROID) 75 MCG tablet was only a 1 month supply and should be 90 days. Would like a call back.

## 2017-12-30 MED ORDER — LEVOTHYROXINE SODIUM 75 MCG PO TABS: 75.0000 ug | ORAL_TABLET | Freq: Every day | ORAL | 1 refills | Status: DC

## 2017-12-30 NOTE — Addendum Note (Signed)
Addended by: Tammi Sou on: 12/30/2017 12:00 PM   Modules accepted: Orders

## 2017-12-30 NOTE — Telephone Encounter (Signed)
done

## 2018-01-05 ENCOUNTER — Other Ambulatory Visit (INDEPENDENT_AMBULATORY_CARE_PROVIDER_SITE_OTHER): Payer: Medicare Other

## 2018-01-05 DIAGNOSIS — I10 Essential (primary) hypertension: Secondary | ICD-10-CM

## 2018-01-05 LAB — POTASSIUM: POTASSIUM: 4.7 meq/L (ref 3.5–5.1)

## 2018-01-06 ENCOUNTER — Encounter: Payer: Self-pay | Admitting: *Deleted

## 2018-01-26 DIAGNOSIS — H353134 Nonexudative age-related macular degeneration, bilateral, advanced atrophic with subfoveal involvement: Secondary | ICD-10-CM | POA: Diagnosis not present

## 2018-01-26 DIAGNOSIS — H35433 Paving stone degeneration of retina, bilateral: Secondary | ICD-10-CM | POA: Diagnosis not present

## 2018-01-26 DIAGNOSIS — H35423 Microcystoid degeneration of retina, bilateral: Secondary | ICD-10-CM | POA: Diagnosis not present

## 2018-01-26 DIAGNOSIS — H31092 Other chorioretinal scars, left eye: Secondary | ICD-10-CM | POA: Diagnosis not present

## 2018-03-16 ENCOUNTER — Encounter: Payer: Self-pay | Admitting: Family Medicine

## 2018-03-16 DIAGNOSIS — L821 Other seborrheic keratosis: Secondary | ICD-10-CM | POA: Diagnosis not present

## 2018-03-16 DIAGNOSIS — L820 Inflamed seborrheic keratosis: Secondary | ICD-10-CM | POA: Diagnosis not present

## 2018-03-16 DIAGNOSIS — L570 Actinic keratosis: Secondary | ICD-10-CM | POA: Diagnosis not present

## 2018-03-16 DIAGNOSIS — Z85828 Personal history of other malignant neoplasm of skin: Secondary | ICD-10-CM | POA: Diagnosis not present

## 2018-03-16 DIAGNOSIS — D1801 Hemangioma of skin and subcutaneous tissue: Secondary | ICD-10-CM | POA: Diagnosis not present

## 2018-03-16 DIAGNOSIS — D485 Neoplasm of uncertain behavior of skin: Secondary | ICD-10-CM | POA: Diagnosis not present

## 2018-04-29 ENCOUNTER — Ambulatory Visit: Payer: Medicare Other | Attending: Ophthalmology | Admitting: Occupational Therapy

## 2018-04-29 DIAGNOSIS — H53413 Scotoma involving central area, bilateral: Secondary | ICD-10-CM | POA: Diagnosis not present

## 2018-04-29 NOTE — Therapy (Signed)
[facility] [address] [place], [place], [address] Phone: [phone]   Fax:  [phone]  Occupational Therapy Treatment  Patient Details  Name: Jamie Byrd MRN: [id number] Date of Birth: 1934-05-15 Referring Provider: Dr. Sherlynn Stalls   Encounter Date: 04/29/2018  OT End of Session - 04/29/18 1120    Visit Number  1    Number of Visits  1    Date for OT Re-Evaluation  -- n/a    Authorization Type  Medicare/ [employer]    OT Start Time  [phone]    OT Stop Time  1030    OT Time Calculation (min)  49 min    Activity Tolerance  Patient tolerated treatment well    Behavior During Therapy  [facility] for tasks assessed/performed       Past Medical History:  Diagnosis Date  . BPH (benign prostatic hypertrophy)   . Difficulty reading   . Difficulty seeing    Difficulty in seeing certain things and reading/ not legally blind!  . Gout   . History of shingles   . Hypertension   . Hypothyroidism   . Macular degeneration     Past Surgical History:  Procedure Laterality Date  . COLONOSCOPY    . POLYPECTOMY      There were no vitals filed for this visit.  Subjective Assessment - 04/29/18 0941    Subjective   Pt reports that his vision continues to decline    Pertinent History  macular degeneration    Patient Stated Goals  to see better    Currently in Pain?  No/denies         [facility] OT Assessment - 04/29/18 0942      Assessment   Medical Diagnosis  macular deneration    Referring Provider  Dr. Sherlynn Stalls    Onset Date/Surgical Date  02/03/18      Precautions   Precaution Comments  low vision      Balance Screen   Has the patient fallen in the past 6 months  No    Has the patient had a decrease in activity level because of a fear of falling?   No    Is the patient reluctant to leave their home because of a fear of falling?   No      Home  Environment   Family/patient expects to be discharged  to:  Private residence    [place]  One level    Lives With  [place]  Retired    Leisure  gardening, being outdoors      ADL   ADL comments  modified indpendent with basic ADLS      IADL   Light Housekeeping  Performs light daily tasks such as dishwashing, bed making yardwork    Meal Prep  Able to complete simple warm meal prep    Medication Management  Is responsible for taking medication in correct dosages at correct time    Financial Management  -- wife handles check writing       Mobility   Mobility Status  Independent      Written Expression   Dominant Hand  Right    Handwriting  100% legible      Vision - History   Visual History  Macular degeneration    Patient Visual Report  Central vision impairment      Vision Assessment   Vision Assessment  Vision tested    Per MD/OD Report  OD 20/150, 20/800    Reading Acuity  (0.8)    Visual Fields  -- central visual field imparment    Patient has diffculty with activities due to visual impairment  Reading bills;Adjusting stove/washing machine dials    Comment  Pt reports he has a 5th grade education so he did not read well before visual impairment.      Cognition   Overall Cognitive Status  Within Functional Limits for tasks assessed    Mini Mental State Exam   22/30 mild difficulty appears related to education level                       OT Education - 04/29/18 1135    Education Details  eccentric viewing strategy, use of 4x stand and handheld magnifier with LED light. Pt returned demonstration of use following education. Pt was provided with info to purchase for home use prn.    Person(s) Educated  Patient;Spouse    Methods  Explanation;Demonstration;Verbal cues;Handout    Comprehension  Verbalized understanding;Returned demonstration          OT Long Term Goals - 04/29/18 1122      OT LONG TERM GOAL #1    Title  n/a eval and treatment completed day of eval, no goals set.            Plan - 04/29/18 1123    Clinical Impression Statement  Pt is a pleasant 84 y.o who presents to occupational therapy with macular degeneration. Pt has visual impairments which impede perfromance of ADLS/IADLS and reading ability. Pt was seen for OT evaluation and treatment on day of eval for education regarding adapted strategies and AE use to compensate for visual impairment.    Occupational Profile and client history currently impacting functional performance  PMH: ARMD, HTN, hypothyroidism, BPH . Pt is retired, he is independent with basic ADLS. Pt enjoys gardening and he reports he does not drive on a regualr basis.    Occupational performance deficits (Please refer to evaluation for details):  ADL's;IADL's;Leisure;Social Participation    Rehab Potential  Good    Current Impairments/barriers affecting progress:  severity of visual impairment    OT Frequency  One time visit    OT Duration  8 weeks    OT Treatment/Interventions  Self-care/ADL training;Patient/family education;Visual/perceptual remediation/compensation;DME and/or AE instruction    Plan  Pt was provided with education on day of eval. Pt/ wife verbalize understanding and therefore no additional visits are needed and no goals were set.    Clinical Decision Making  Limited treatment options, no task modification necessary    Consulted and Agree with Plan of Care  Patient;Family member/caregiver    Family Member Consulted  wife       Patient will benefit from skilled therapeutic intervention in order to improve the following deficits and impairments:  Impaired vision/preception, Decreased knowledge of precautions, Impaired perceived functional ability  Visit Diagnosis: [place] involving central area, bilateral - Plan: Ot plan of care cert/re-cert    Problem List Patient Active Problem List   Diagnosis Date Noted  . Macular degeneration  12/18/2017  . Memory loss, short term 12/14/2015  . Hearing loss of aging 12/09/2013  . Encounter for Medicare annual wellness exam 12/09/2013  . Personal history of colonic polyps 12/08/2012  . Tinea versicolor 09/17/2011  . Prostate cancer  screening 09/10/2011  . Hyperkalemia 09/25/2010  . Hyperglycemia 09/25/2010  . Low HDL (under 40) 11/05/2009  . Hypothyroidism 07/28/2007  . Gout 07/28/2007  . ERECTILE DYSFUNCTION 07/28/2007  . Essential hypertension 07/28/2007  . Benign prostatic hyperplasia 07/28/2007    Ashna Dorough 04/29/2018, 11:40 AM Theone Murdoch, OTR/L [phone] Phone: [phone] 11:40 AM 04/29/18 [facility] [address] [place], [place], [address] Phone: [phone]   Fax:  [phone]  Name: Jamie Byrd MRN: [id number] Date of Birth: May 23, 1934

## 2018-05-21 ENCOUNTER — Encounter: Payer: Self-pay | Admitting: Family Medicine

## 2018-05-21 ENCOUNTER — Ambulatory Visit (INDEPENDENT_AMBULATORY_CARE_PROVIDER_SITE_OTHER): Payer: Medicare Other | Admitting: Family Medicine

## 2018-05-21 VITALS — BP 146/72 | HR 57 | Temp 97.7°F | Ht 71.0 in | Wt 218.0 lb

## 2018-05-21 DIAGNOSIS — M545 Low back pain, unspecified: Secondary | ICD-10-CM

## 2018-05-21 MED ORDER — MELOXICAM 7.5 MG PO TABS: 7.5000 mg | ORAL_TABLET | Freq: Every day | ORAL | 0 refills | Status: DC

## 2018-05-21 MED ORDER — METHOCARBAMOL 500 MG PO TABS: 500.0000 mg | ORAL_TABLET | Freq: Every evening | ORAL | 1 refills | Status: DC | PRN

## 2018-05-21 NOTE — Patient Instructions (Signed)
Keep walking- this is good for your back   Start doing the stretches in the handout  Avoid lifting very heavy objects   Take meloxciam 7.5 mg with food once daily for 2 weeks ( stop it if it bothers your stomach)  At night- try the muscle relaxer (methocarbamol) - this should help also  Heat for 10 minutes at a time can also help   Update if not starting to improve in a week or if worsening  We may consider physical therapy

## 2018-05-21 NOTE — Progress Notes (Signed)
Subjective:    Patient ID: Jamie Byrd, male    DOB: [birth year], 84 y.o.   MRN: [id number]  HPI Here c/o back pain for a week   Wt Readings from Last 3 Encounters:  05/21/18 218 lb (98.9 kg)  12/18/17 222 lb (100.7 kg)  12/18/17 222 lb (100.7 kg)   Back hurts L low back today  Can be both sides Bothers him the most - stiff and more painful- hard to straighten up  Walking loosens it up  He has a new mattress- it is a good one for him / firm = prefers this   It changes with position (sharp pain occ and ache)  Noticeable when he bends over or gets up after sitting (or turning over in bed)  He is a side sleeper  No radiation to legs No numbness or weakness   Has not tried heat or ice  He tried some aspirin- did not help much   No trauma or new activity or injury He tries not to lift heavy   Patient Active Problem List   Diagnosis Date Noted  . Low back pain 05/21/2018  . Macular degeneration 12/18/2017  . Memory loss, short term 12/14/2015  . Hearing loss of aging 12/09/2013  . Encounter for Medicare annual wellness exam 12/09/2013  . Personal history of colonic polyps 12/08/2012  . Tinea versicolor 09/17/2011  . Prostate cancer screening 09/10/2011  . Hyperkalemia 09/25/2010  . Hyperglycemia 09/25/2010  . Low HDL (under 40) 11/05/2009  . Hypothyroidism 07/28/2007  . Gout 07/28/2007  . ERECTILE DYSFUNCTION 07/28/2007  . Essential hypertension 07/28/2007  . Benign prostatic hyperplasia 07/28/2007   Past Medical History:  Diagnosis Date  . BPH (benign prostatic hypertrophy)   . Difficulty reading   . Difficulty seeing    Difficulty in seeing certain things and reading/ not legally blind!  . Gout   . History of shingles   . Hypertension   . Hypothyroidism   . Macular degeneration    Past Surgical History:  Procedure Laterality Date  . COLONOSCOPY    . POLYPECTOMY     Social History   Tobacco Use  . Smoking status: Former Smoker    Last attempt to  quit: 10/13/1968    Years since quitting: 49.6  . Smokeless tobacco: Never Used  Substance Use Topics  . Alcohol use: No    Comment: no alcohol in 20 years  . Drug use: No   Family History  Problem Relation Age of Onset  . Heart disease Mother        CABG  . Heart disease Father        CAD  . Cancer Father        lung CA  . Cancer Brother        lung CA   No Known Allergies Current Outpatient Medications on File Prior to Visit  Medication Sig Dispense Refill  . allopurinol (ZYLOPRIM) 100 MG tablet Take 1 tablet (100 mg total) by mouth daily. 90 tablet 3  . levothyroxine (SYNTHROID, LEVOTHROID) 75 MCG tablet Take 1 tablet (75 mcg total) by mouth daily. 90 tablet 1  . lisinopril (PRINIVIL,ZESTRIL) 20 MG tablet Take 1 tablet (20 mg total) by mouth daily. 90 tablet 3   No current facility-administered medications on file prior to visit.      Review of Systems  Constitutional: Negative for activity change, appetite change, fatigue, fever and unexpected weight change.  HENT: Negative for congestion, rhinorrhea, sore throat  and trouble swallowing.   Eyes: Negative for pain, redness, itching and visual disturbance.  Respiratory: Negative for cough, chest tightness, shortness of breath and wheezing.   Cardiovascular: Negative for chest pain and palpitations.  Gastrointestinal: Negative for abdominal pain, blood in stool, constipation, diarrhea and nausea.  Endocrine: Negative for cold intolerance, heat intolerance, polydipsia and polyuria.  Genitourinary: Negative for difficulty urinating, dysuria, frequency and urgency.  Musculoskeletal: Positive for back pain. Negative for arthralgias, joint swelling and myalgias.  Skin: Negative for pallor and rash.  Neurological: Negative for dizziness, tremors, weakness, numbness and headaches.  Hematological: Negative for adenopathy. Does not bruise/bleed easily.  Psychiatric/Behavioral: Negative for decreased concentration and dysphoric mood.  The patient is not nervous/anxious.        Objective:   Physical Exam  Constitutional: He appears well-developed and well-nourished. No distress.  Well appearing elderly male   HENT:  Head: Normocephalic and atraumatic.  Eyes: Pupils are equal, round, and reactive to light. Conjunctivae and EOM are normal. No scleral icterus.  Neck: Normal range of motion. Neck supple.  Cardiovascular: Normal rate and regular rhythm.  Pulmonary/Chest: Effort normal and breath sounds normal. He has no wheezes. He has no rales.  Abdominal: Soft. Bowel sounds are normal. He exhibits no distension. There is no tenderness.  Musculoskeletal: He exhibits tenderness.       Right shoulder: He exhibits tenderness and spasm. He exhibits normal range of motion, no bony tenderness, no swelling, no crepitus, no deformity, normal pulse and normal strength.       Lumbar back: He exhibits decreased range of motion, tenderness and spasm. He exhibits no bony tenderness and no edema.  Tight lumbar musculature to palp bilaterally  No spinous process tenderness  No rash/skin change Nl rom  Stiff when getting up from the table Neg SLR Nl gait  No neuro changes   Lymphadenopathy:    He has no cervical adenopathy.  Neurological: He is alert. He has normal strength and normal reflexes. He displays no atrophy. No cranial nerve deficit or sensory deficit. He exhibits normal muscle tone. Coordination normal.  Negative SLR  Skin: Skin is warm and dry. No rash noted. No erythema. No pallor.  Psychiatric: He has a normal mood and affect.          Assessment & Plan:   Problem List Items Addressed This Visit      Other   Low back pain - Primary    Worse after inactivity and better with walking, no sciatic symptoms and reassuring exam Suspect strain/spasm  Info given on back pain with stretches to start at home/reviewed Recommend heat for 10 min at a time meloxciam 7.5 mg with food qd for 2 wk  Methocarbamol prn qhs  with caution of sedation If not imp in 1-2 wk inst to call -would consider PT       Relevant Medications   meloxicam (MOBIC) 7.5 MG tablet   methocarbamol (ROBAXIN) 500 MG tablet

## 2018-05-21 NOTE — Assessment & Plan Note (Signed)
Worse after inactivity and better with walking, no sciatic symptoms and reassuring exam Suspect strain/spasm  Info given on back pain with stretches to start at home/reviewed Recommend heat for 10 min at a time meloxciam 7.5 mg with food qd for 2 wk  Methocarbamol prn qhs with caution of sedation If not imp in 1-2 wk inst to call -would consider PT

## 2018-06-07 ENCOUNTER — Other Ambulatory Visit: Payer: Self-pay | Admitting: Family Medicine

## 2018-06-15 DIAGNOSIS — D485 Neoplasm of uncertain behavior of skin: Secondary | ICD-10-CM | POA: Diagnosis not present

## 2018-06-15 DIAGNOSIS — Z85828 Personal history of other malignant neoplasm of skin: Secondary | ICD-10-CM | POA: Diagnosis not present

## 2018-06-15 DIAGNOSIS — L820 Inflamed seborrheic keratosis: Secondary | ICD-10-CM | POA: Diagnosis not present

## 2018-07-27 ENCOUNTER — Encounter: Payer: Self-pay | Admitting: Family Medicine

## 2018-07-27 DIAGNOSIS — H35433 Paving stone degeneration of retina, bilateral: Secondary | ICD-10-CM | POA: Diagnosis not present

## 2018-07-27 DIAGNOSIS — H35423 Microcystoid degeneration of retina, bilateral: Secondary | ICD-10-CM | POA: Diagnosis not present

## 2018-07-27 DIAGNOSIS — H353134 Nonexudative age-related macular degeneration, bilateral, advanced atrophic with subfoveal involvement: Secondary | ICD-10-CM | POA: Diagnosis not present

## 2018-07-27 DIAGNOSIS — H31092 Other chorioretinal scars, left eye: Secondary | ICD-10-CM | POA: Diagnosis not present

## 2018-07-27 LAB — HM DIABETES EYE EXAM

## 2018-07-29 ENCOUNTER — Ambulatory Visit (INDEPENDENT_AMBULATORY_CARE_PROVIDER_SITE_OTHER): Payer: Medicare Other

## 2018-07-29 DIAGNOSIS — Z23 Encounter for immunization: Secondary | ICD-10-CM | POA: Diagnosis not present

## 2018-09-20 DIAGNOSIS — D485 Neoplasm of uncertain behavior of skin: Secondary | ICD-10-CM | POA: Diagnosis not present

## 2018-09-20 DIAGNOSIS — L570 Actinic keratosis: Secondary | ICD-10-CM | POA: Diagnosis not present

## 2018-09-20 DIAGNOSIS — Z85828 Personal history of other malignant neoplasm of skin: Secondary | ICD-10-CM | POA: Diagnosis not present

## 2018-09-20 DIAGNOSIS — C44311 Basal cell carcinoma of skin of nose: Secondary | ICD-10-CM | POA: Diagnosis not present

## 2018-09-20 DIAGNOSIS — C44319 Basal cell carcinoma of skin of other parts of face: Secondary | ICD-10-CM | POA: Diagnosis not present

## 2018-10-22 DIAGNOSIS — C44329 Squamous cell carcinoma of skin of other parts of face: Secondary | ICD-10-CM | POA: Diagnosis not present

## 2018-10-22 DIAGNOSIS — Z85828 Personal history of other malignant neoplasm of skin: Secondary | ICD-10-CM | POA: Diagnosis not present

## 2018-10-22 DIAGNOSIS — L570 Actinic keratosis: Secondary | ICD-10-CM | POA: Diagnosis not present

## 2018-10-22 DIAGNOSIS — D485 Neoplasm of uncertain behavior of skin: Secondary | ICD-10-CM | POA: Diagnosis not present

## 2018-10-22 DIAGNOSIS — L820 Inflamed seborrheic keratosis: Secondary | ICD-10-CM | POA: Diagnosis not present

## 2018-10-26 DIAGNOSIS — Z85828 Personal history of other malignant neoplasm of skin: Secondary | ICD-10-CM | POA: Diagnosis not present

## 2018-10-26 DIAGNOSIS — C44311 Basal cell carcinoma of skin of nose: Secondary | ICD-10-CM | POA: Diagnosis not present

## 2018-11-02 DIAGNOSIS — L570 Actinic keratosis: Secondary | ICD-10-CM | POA: Diagnosis not present

## 2018-11-16 DIAGNOSIS — Z85828 Personal history of other malignant neoplasm of skin: Secondary | ICD-10-CM | POA: Diagnosis not present

## 2018-11-16 DIAGNOSIS — C44329 Squamous cell carcinoma of skin of other parts of face: Secondary | ICD-10-CM | POA: Diagnosis not present

## 2018-12-24 ENCOUNTER — Telehealth: Payer: Self-pay | Admitting: Family Medicine

## 2018-12-24 NOTE — Telephone Encounter (Signed)
Left message asking pt to call office  Please let pt know his appointment with [facility] wellness nurse on 3/16 @ 9 has been canceled.  He needs to arrive 3/16 @ 9:15 for his appiontment with dr tower

## 2018-12-27 ENCOUNTER — Ambulatory Visit: Payer: Medicare Other

## 2018-12-27 ENCOUNTER — Encounter: Payer: Medicare Other | Admitting: Family Medicine

## 2018-12-29 ENCOUNTER — Other Ambulatory Visit: Payer: Self-pay | Admitting: Family Medicine

## 2019-01-03 ENCOUNTER — Encounter: Payer: Medicare Other | Admitting: Family Medicine

## 2019-03-15 ENCOUNTER — Emergency Department (HOSPITAL_COMMUNITY): Payer: Medicare Other

## 2019-03-15 ENCOUNTER — Emergency Department (HOSPITAL_COMMUNITY)
Admission: EM | Admit: 2019-03-15 | Discharge: 2019-03-15 | Disposition: A | Discharge status: Home / Self Care | Payer: Medicare Other | Attending: Emergency Medicine | Admitting: Emergency Medicine

## 2019-03-15 ENCOUNTER — Encounter (HOSPITAL_COMMUNITY): Payer: Self-pay

## 2019-03-15 ENCOUNTER — Ambulatory Visit: Payer: Self-pay

## 2019-03-15 ENCOUNTER — Other Ambulatory Visit: Payer: Self-pay

## 2019-03-15 DIAGNOSIS — R079 Chest pain, unspecified: Secondary | ICD-10-CM | POA: Diagnosis not present

## 2019-03-15 DIAGNOSIS — E039 Hypothyroidism, unspecified: Secondary | ICD-10-CM | POA: Insufficient documentation

## 2019-03-15 DIAGNOSIS — Z87891 Personal history of nicotine dependence: Secondary | ICD-10-CM | POA: Insufficient documentation

## 2019-03-15 DIAGNOSIS — R0789 Other chest pain: Secondary | ICD-10-CM | POA: Diagnosis not present

## 2019-03-15 DIAGNOSIS — I10 Essential (primary) hypertension: Secondary | ICD-10-CM | POA: Insufficient documentation

## 2019-03-15 LAB — CBC WITH DIFFERENTIAL/PLATELET
Abs Immature Granulocytes: 0.02 10*3/uL (ref 0.00–0.07)
Basophils Absolute: 0.1 10*3/uL (ref 0.0–0.1)
Basophils Relative: 1 %
Eosinophils Absolute: 0.4 10*3/uL (ref 0.0–0.5)
Eosinophils Relative: 7 %
HCT: 44.5 % (ref 39.0–52.0)
Hemoglobin: 15 g/dL (ref 13.0–17.0)
Immature Granulocytes: 0 %
Lymphocytes Relative: 30 %
Lymphs Abs: 1.7 10*3/uL (ref 0.7–4.0)
MCH: 33 pg (ref 26.0–34.0)
MCHC: 33.7 g/dL (ref 30.0–36.0)
MCV: 97.8 fL (ref 80.0–100.0)
Monocytes Absolute: 0.5 10*3/uL (ref 0.1–1.0)
Monocytes Relative: 9 %
Neutro Abs: 3 10*3/uL (ref 1.7–7.7)
Neutrophils Relative %: 53 %
Platelets: 195 10*3/uL (ref 150–400)
RBC: 4.55 MIL/uL (ref 4.22–5.81)
RDW: 13.2 % (ref 11.5–15.5)
WBC: 5.7 10*3/uL (ref 4.0–10.5)
nRBC: 0 % (ref 0.0–0.2)

## 2019-03-15 LAB — COMPREHENSIVE METABOLIC PANEL
ALT: 14 U/L (ref 0–44)
AST: 25 U/L (ref 15–41)
Albumin: 4.3 g/dL (ref 3.5–5.0)
Alkaline Phosphatase: 112 U/L (ref 38–126)
Anion gap: 8 (ref 5–15)
BUN: 16 mg/dL (ref 8–23)
CO2: 25 mmol/L (ref 22–32)
Calcium: 9.8 mg/dL (ref 8.9–10.3)
Chloride: 103 mmol/L (ref 98–111)
Creatinine, Ser: 1.04 mg/dL (ref 0.61–1.24)
GFR calc Af Amer: >60 mL/min (ref >60)
GFR calc non Af Amer: >60 mL/min (ref >60)
Glucose, Bld: 113 mg/dL — ABNORMAL HIGH (ref 70–99)
Potassium: 4.5 mmol/L (ref 3.5–5.1)
Sodium: 136 mmol/L (ref 135–145)
Total Bilirubin: 0.9 mg/dL (ref 0.3–1.2)
Total Protein: 7.5 g/dL (ref 6.5–8.1)

## 2019-03-15 LAB — I-STAT TROPONIN, ED: Troponin i, poc: 0 ng/mL (ref 0.00–0.08)

## 2019-03-15 NOTE — Telephone Encounter (Signed)
Called patient to make sure that he is on the way to the ER. Left message on voicemail for patient to call back,.

## 2019-03-15 NOTE — ED Triage Notes (Signed)
3 day hx of tightness to midsternal intermittent without radiation.  States sx are intermittent with no precipitating factors. Denies diaphoresis with episodes some nausea.   Haven't felt like himself the last few days.  Stopped at [employer] and took BP and came to ED.

## 2019-03-15 NOTE — ED Notes (Signed)
Pt on phone with family.  No concerns at this time.  Pt calling for his ride home.

## 2019-03-15 NOTE — ED Provider Notes (Signed)
[facility] EMERGENCY DEPARTMENT Provider Note   CSN: [id number] Arrival date & time: 03/15/19  1287    History   Chief Complaint No chief complaint on file.   HPI Jamie Byrd is a 85 y.o. male.     Patient presenting with concerns for elevated blood pressure.  Went to the fire station and they got a reading in the 867 systolic range.  Patient without any particular symptoms.  Patient did have complaint of some intermittent very brief lasting only seconds chest discomfort over the past 3 days.  No chest pain that lasted 15 minutes or longer.  Patient is on lisinopril for blood pressure.  Does have a primary care doctor.  Normally his blood pressure has been well controlled.  Denies any headache any shortness of breath any strokelike symptoms.     Past Medical History:  Diagnosis Date  . BPH (benign prostatic hypertrophy)   . Difficulty reading   . Difficulty seeing    Difficulty in seeing certain things and reading/ not legally blind!  . Gout   . History of shingles   . Hypertension   . Hypothyroidism   . Macular degeneration     Patient Active Problem List   Diagnosis Date Noted  . Low back pain 05/21/2018  . Macular degeneration 12/18/2017  . Memory loss, short term 12/14/2015  . Hearing loss of aging 12/09/2013  . Encounter for Medicare annual wellness exam 12/09/2013  . Personal history of colonic polyps 12/08/2012  . Tinea versicolor 09/17/2011  . Prostate cancer screening 09/10/2011  . Hyperkalemia 09/25/2010  . Hyperglycemia 09/25/2010  . Low HDL (under 40) 11/05/2009  . Hypothyroidism 07/28/2007  . Gout 07/28/2007  . ERECTILE DYSFUNCTION 07/28/2007  . Essential hypertension 07/28/2007  . Benign prostatic hyperplasia 07/28/2007    Past Surgical History:  Procedure Laterality Date  . COLONOSCOPY    . POLYPECTOMY          Home Medications    Prior to Admission medications   Medication Sig Start Date End Date Taking?  Authorizing Provider  allopurinol (ZYLOPRIM) 100 MG tablet TAKE 1 TABLET BY MOUTH ONCE DAILY Patient taking differently: Take 100 mg by mouth daily.  12/30/18  Yes Tower, Wynelle Fanny, MD  levothyroxine (SYNTHROID, LEVOTHROID) 75 MCG tablet TAKE 1 TABLET BY MOUTH ONCE A DAY Patient taking differently: Take 75 mcg by mouth daily before breakfast.  12/30/18  Yes Tower, Marne A, MD  lisinopril (PRINIVIL,ZESTRIL) 20 MG tablet TAKE 1 TABLET BY MOUTH ONCE DAILY Patient taking differently: Take 20 mg by mouth daily.  12/30/18  Yes Tower, Wynelle Fanny, MD  Multiple Vitamins-Minerals (PRESERVISION AREDS 2 PO) Take 1 tablet by mouth daily.   Yes [provider]  meloxicam (MOBIC) 7.5 MG tablet Take 1 tablet (7.5 mg total) by mouth daily. With food for 2 weeks Patient not taking: Reported on 03/15/2019 05/21/18   Tower, Wynelle Fanny, MD  methocarbamol (ROBAXIN) 500 MG tablet Take 1 tablet (500 mg total) by mouth at bedtime as needed for muscle spasms (as needed for back pain). Caution of sedation Patient not taking: Reported on 03/15/2019 05/21/18   Abner Greenspan, MD    Family History Family History  Problem Relation Age of Onset  . Heart disease Mother        CABG  . Heart disease Father        CAD  . Cancer Father        lung CA  . Cancer Brother  lung CA    Social History Social History   Tobacco Use  . Smoking status: Former Smoker    Last attempt to quit: 10/13/1968    Years since quitting: 50.4  . Smokeless tobacco: Never Used  Substance Use Topics  . Alcohol use: No    Comment: no alcohol in 20 years  . Drug use: No     Allergies   Patient has no known allergies.   Review of Systems Review of Systems  Constitutional: Negative for chills and fever.  HENT: Negative for rhinorrhea and sore throat.   Eyes: Negative for visual disturbance.  Respiratory: Negative for cough and shortness of breath.   Cardiovascular: Positive for chest pain. Negative for leg swelling.  Gastrointestinal:  Negative for abdominal pain, diarrhea, nausea and vomiting.  Genitourinary: Negative for dysuria.  Musculoskeletal: Negative for back pain and neck pain.  Skin: Negative for rash.  Neurological: Negative for dizziness, speech difficulty, weakness, light-headedness, numbness and headaches.  Hematological: Does not bruise/bleed easily.  Psychiatric/Behavioral: Negative for confusion.     Physical Exam Updated Vital Signs BP (!) 156/77   Pulse (!) 58   Temp 98 F (36.7 C) (Oral)   Resp 15   Ht 1.829 m (6')   Wt 99.8 kg   SpO2 98%   BMI 29.84 kg/m   Physical Exam Vitals signs and nursing note reviewed.  Constitutional:      Appearance: He is well-developed.  HENT:     Head: Normocephalic and atraumatic.  Eyes:     Extraocular Movements: Extraocular movements intact.     Conjunctiva/sclera: Conjunctivae normal.     Pupils: Pupils are equal, round, and reactive to light.  Neck:     Musculoskeletal: Normal range of motion and neck supple.  Cardiovascular:     Rate and Rhythm: Normal rate and regular rhythm.     Heart sounds: No murmur.  Pulmonary:     Effort: Pulmonary effort is normal. No respiratory distress.     Breath sounds: Normal breath sounds.  Abdominal:     Palpations: Abdomen is soft.     Tenderness: There is no abdominal tenderness.  Musculoskeletal: Normal range of motion.        General: No swelling.  Skin:    General: Skin is warm and dry.  Neurological:     General: No focal deficit present.     Mental Status: He is alert and oriented to person, place, and time.     Cranial Nerves: No cranial nerve deficit.     Sensory: No sensory deficit.     Motor: No weakness.      ED Treatments / Results  Labs (all labs ordered are listed, but only abnormal results are displayed) Labs Reviewed  COMPREHENSIVE METABOLIC PANEL - Abnormal; Notable for the following components:      Result Value   Glucose, Bld 113 (*)    All other components within normal  limits  CBC WITH DIFFERENTIAL/PLATELET  I-STAT TROPONIN, ED    EKG EKG Interpretation  Date/Time:  Tuesday March 15 2019 09:58:17 EDT Ventricular Rate:  63 PR Interval:    QRS Duration: 93 QT Interval:  380 QTC Calculation: 389 R Axis:   77 Text Interpretation:  Sinus rhythm Atrial premature complex Anterior infarct, old No previous ECGs available Confirmed by Fredia Sorrow [phone]) on 03/15/2019 10:09:38 AM   Radiology Dg Chest Port 1 View  Result Date: 03/15/2019 CLINICAL DATA:  Chest pain EXAM: PORTABLE CHEST 1 VIEW COMPARISON:  None. FINDINGS: Normal heart size. Normal mediastinal contour. No pneumothorax. No pleural effusion. Lungs appear clear, with no acute consolidative airspace disease and no pulmonary edema. IMPRESSION: No active disease. Electronically Signed   By: Ilona Sorrel M.D.   On: 03/15/2019 11:13    Procedures Procedures (including critical care time)  Medications Ordered in ED Medications - No data to display   Initial Impression / Assessment and Plan / ED Course  I have reviewed the triage vital signs and the nursing notes.  Pertinent labs & imaging results that were available during my care of the patient were reviewed by me and considered in my medical decision making (see chart for details).        Blood pressure has been elevated here with systolic pressures of 086 160.  May very well require adjustments in his blood pressure medicine but recommend he does a daily log.  And have these adjustments done by his primary care doctor.  Patient without any evidence of hypertensive urgency or emergency.  Troponin negative EKG without acute changes.  Chest x-ray without any acute findings.  Labs without any significant abnormalities.  For the intermittent chest pain patient given precautions that if he has any chest pain lasting 15 minutes or longer needs to get seen right away otherwise we will have him follow-up with cardiology.  Recommending a baby aspirin  a day.  Final Clinical Impressions(s) / ED Diagnoses   Final diagnoses:  Essential hypertension  Chest pain, unspecified type    ED Discharge Orders    None       Fredia Sorrow, MD 03/15/19 1357

## 2019-03-15 NOTE — Discharge Instructions (Addendum)
Blood pressure is elevated here but we need to trend.  Recommend checking your blood pressure daily and keeping a log and calling your primary care doctor for follow-up.  May very well require some additional adjustments to your hypertensive regiment.  Return for any severe headache chest pain lasting 15 or 20 minutes or longer.  Any stroke symptoms or any difficulty breathing.  For the intermittent chest pain you have been having a recommend that you follow-up with cardiology make an appointment.  Information provided above.  And as stated for any chest pain lasting 15 minutes or longer you need to get seen.

## 2019-03-15 NOTE — Telephone Encounter (Signed)
Thanks- I will watch for notes/arrival in ED

## 2019-03-15 NOTE — Telephone Encounter (Signed)
Pt called stating that he has not felt well. He went to the local fire station and they took his BP 192/100. He states that he has some chest tightness and feels dizzy.  He has taken all his medications as prescribed. Pt was advised to go to the ER for treatment. Pt verbalized understanding of all instructions.   Reason for Disposition . [5] Systolic BP  >= 790 OR Diastolic >= 383 AND [3] cardiac or neurologic symptoms (e.g., chest pain, difficulty breathing, unsteady gait, blurred vision)  Answer Assessment - Initial Assessment Questions 1. BLOOD PRESSURE: "What is the blood pressure?" "Did you take at least two measurements 5 minutes apart?"     192/100 today 2. ONSET: "When did you take your blood pressure?"     today 3. HOW: "How did you obtain the blood pressure?" (e.g., visiting nurse, automatic home BP monitor)     Fire department 4. HISTORY: "Do you have a history of high blood pressure?"     yes 5. MEDICATIONS: "Are you taking any medications for blood pressure?" "Have you missed any doses recently?"     No has not missed any medication 6. OTHER SYMPTOMS: "Do you have any symptoms?" (e.g., headache, chest pain, blurred vision, difficulty breathing, weakness)    Tight chest, lightheaded 7. PREGNANCY: "Is there any chance you are pregnant?" "When was your last menstrual period?"     N/A  Protocols used: HIGH BLOOD PRESSURE-A-AH

## 2019-03-16 ENCOUNTER — Encounter: Payer: Self-pay | Admitting: Family Medicine

## 2019-03-16 ENCOUNTER — Ambulatory Visit (INDEPENDENT_AMBULATORY_CARE_PROVIDER_SITE_OTHER): Payer: Medicare Other | Admitting: Family Medicine

## 2019-03-16 VITALS — Wt 220.0 lb

## 2019-03-16 DIAGNOSIS — I10 Essential (primary) hypertension: Secondary | ICD-10-CM

## 2019-03-16 MED ORDER — LISINOPRIL 40 MG PO TABS: 40.0000 mg | ORAL_TABLET | Freq: Every day | ORAL | 3 refills | Status: DC

## 2019-03-16 NOTE — Progress Notes (Signed)
Virtual Visit via Telephone Note  I connected with Jamie Byrd on 03/16/19 at  4:00 PM EDT by telephone and verified that I am speaking with the correct person using two identifiers.  Location: Patient: home Provider: office    I discussed the limitations, risks, security and privacy concerns of performing an evaluation and management service by telephone and the availability of in person appointments. I also discussed with the patient that there may be a patient responsible charge related to this service. The patient expressed understanding and agreed to proceed.  This visit is done by phone as pt does not have video capability  History of Present Illness: Pt presents for f/u of ED visit yesterday   He went to ED with concerns for elevated bp  Had been to the fire station and he had a reading in 812X systolic Had some fleeting chest pains (very brief) in the several days prior   Labs: Results for orders placed or performed during the hospital encounter of 03/15/19  Comprehensive metabolic panel  Result Value Ref Range   Sodium 136 135 - 145 mmol/L   Potassium 4.5 3.5 - 5.1 mmol/L   Chloride 103 98 - 111 mmol/L   CO2 25 22 - 32 mmol/L   Glucose, Bld 113 (H) 70 - 99 mg/dL   BUN 16 8 - 23 mg/dL   Creatinine, Ser 1.04 0.61 - 1.24 mg/dL   Calcium 9.8 8.9 - 10.3 mg/dL   Total Protein 7.5 6.5 - 8.1 g/dL   Albumin 4.3 3.5 - 5.0 g/dL   AST 25 15 - 41 U/L   ALT 14 0 - 44 U/L   Alkaline Phosphatase 112 38 - 126 U/L   Total Bilirubin 0.9 0.3 - 1.2 mg/dL   GFR calc non Af Amer >60 >60 mL/min   GFR calc Af Amer >60 >60 mL/min   Anion gap 8 5 - 15  CBC with Differential/Platelet  Result Value Ref Range   WBC 5.7 4.0 - 10.5 K/uL   RBC 4.55 4.22 - 5.81 MIL/uL   Hemoglobin 15.0 13.0 - 17.0 g/dL   HCT 44.5 39.0 - 52.0 %   MCV 97.8 80.0 - 100.0 fL   MCH 33.0 26.0 - 34.0 pg   MCHC 33.7 30.0 - 36.0 g/dL   RDW 13.2 11.5 - 15.5 %   Platelets 195 150 - 400 K/uL   nRBC 0.0 0.0 - 0.2 %    Neutrophils Relative % 53 %   Neutro Abs 3.0 1.7 - 7.7 K/uL   Lymphocytes Relative 30 %   Lymphs Abs 1.7 0.7 - 4.0 K/uL   Monocytes Relative 9 %   Monocytes Absolute 0.5 0.1 - 1.0 K/uL   Eosinophils Relative 7 %   Eosinophils Absolute 0.4 0.0 - 0.5 K/uL   Basophils Relative 1 %   Basophils Absolute 0.1 0.0 - 0.1 K/uL   Immature Granulocytes 0 %   Abs Immature Granulocytes 0.02 0.00 - 0.07 K/uL  I-Stat Troponin, ED (not at [facility])  Result Value Ref Range   Troponin i, poc 0.00 0.00 - 0.08 ng/mL   Comment 3             EKG sowed sinus rhythm rate of 63 with atiral premature complex and possible old anterior infarct   Dg Chest Port 1 View  Result Date: 03/15/2019 CLINICAL DATA:  Chest pain EXAM: PORTABLE CHEST 1 VIEW COMPARISON:  None. FINDINGS: Normal heart size. Normal mediastinal contour. No pneumothorax. No pleural effusion.  Lungs appear clear, with no acute consolidative airspace disease and no pulmonary edema. IMPRESSION: No active disease. Electronically Signed   By: Ilona Sorrel M.D.   On: 03/15/2019 11:13    With reassuring w/u and systolic pressures of 226J-335 in ED he was inst to touch base with [place] to adjust medication  He currently takes lisinopril 20 mg daily   BP Readings from Last 3 Encounters:  03/15/19 (!) 147/91  05/21/18 (!) 146/72  12/18/17 138/82   Wt Readings from Last 3 Encounters:  03/15/19 220 lb (99.8 kg)  05/21/18 218 lb (98.9 kg)  12/18/17 222 lb (100.7 kg)    He confesses to eating too much salt  He salts his apples  Eating bacon  Some carry out and fast food  Does not check his bp at home   He walks for exercise every day - about 1 mile per day Works in the garden also  He feels great No cp and sob   Review of Systems  Constitutional: Negative for chills, diaphoresis, fever and malaise/fatigue.  HENT: Negative for sore throat.   Eyes: Negative for blurred vision.  Respiratory: Negative for cough and shortness of breath.    Cardiovascular: Negative for chest pain, palpitations, orthopnea and leg swelling.  Gastrointestinal: Negative for abdominal pain, nausea and vomiting.  Genitourinary: Negative for frequency.  Skin: Negative for itching and rash.  Neurological: Negative for dizziness and headaches.    Patient Active Problem List   Diagnosis Date Noted  . Low back pain 05/21/2018  . Macular degeneration 12/18/2017  . Memory loss, short term 12/14/2015  . Hearing loss of aging 12/09/2013  . Encounter for Medicare annual wellness exam 12/09/2013  . Personal history of colonic polyps 12/08/2012  . Tinea versicolor 09/17/2011  . Prostate cancer screening 09/10/2011  . Hyperkalemia 09/25/2010  . Hyperglycemia 09/25/2010  . Low HDL (under 40) 11/05/2009  . Hypothyroidism 07/28/2007  . Gout 07/28/2007  . ERECTILE DYSFUNCTION 07/28/2007  . Essential hypertension 07/28/2007  . Benign prostatic hyperplasia 07/28/2007   Past Medical History:  Diagnosis Date  . BPH (benign prostatic hypertrophy)   . Difficulty reading   . Difficulty seeing    Difficulty in seeing certain things and reading/ not legally blind!  . Gout   . History of shingles   . Hypertension   . Hypothyroidism   . Macular degeneration    Past Surgical History:  Procedure Laterality Date  . COLONOSCOPY    . POLYPECTOMY     Social History   Tobacco Use  . Smoking status: Former Smoker    Last attempt to quit: 10/13/1968    Years since quitting: 50.4  . Smokeless tobacco: Never Used  Substance Use Topics  . Alcohol use: No    Comment: no alcohol in 20 years  . Drug use: No   Family History  Problem Relation Age of Onset  . Heart disease Mother        CABG  . Heart disease Father        CAD  . Cancer Father        lung CA  . Cancer Brother        lung CA   No Known Allergies Current Outpatient Medications on File Prior to Visit  Medication Sig Dispense Refill  . allopurinol (ZYLOPRIM) 100 MG tablet TAKE 1 TABLET BY  MOUTH ONCE DAILY (Patient taking differently: Take 100 mg by mouth daily. ) 90 tablet 0  . levothyroxine (SYNTHROID, LEVOTHROID) 75  MCG tablet TAKE 1 TABLET BY MOUTH ONCE A DAY (Patient taking differently: Take 75 mcg by mouth daily before breakfast. ) 90 tablet 0  . meloxicam (MOBIC) 7.5 MG tablet Take 1 tablet (7.5 mg total) by mouth daily. With food for 2 weeks (Patient not taking: Reported on 03/15/2019) 15 tablet 0  . methocarbamol (ROBAXIN) 500 MG tablet Take 1 tablet (500 mg total) by mouth at bedtime as needed for muscle spasms (as needed for back pain). Caution of sedation (Patient not taking: Reported on 03/15/2019) 30 tablet 1  . Multiple Vitamins-Minerals (PRESERVISION AREDS 2 PO) Take 1 tablet by mouth daily.     No current facility-administered medications on file prior to visit.     Observations/Objective: Pt sounds well, like his normal self  No distress/in a good mood with a normal affect  He hears well on the phone  Not hoarse No cough or sob apparent  Mentally sharp/no cognitive changes noted   Assessment and Plan: Problem List Items Addressed This Visit      Cardiovascular and Mediastinum   Essential hypertension - Primary    bp has been trending up and it was very high at the fire station yesterday ED eval is very reassuring  Reviewed hospital records, lab results and studies in detail   Pt has been eating more processed/salty foods and plans to change that He exercises regularly  We will increase lisinopril from 20 to 40 mg once daily  If not effective-can add a diuretic He has f/u next month but will let us know if he wants a nurse visit for bp check before then  inst to call if side effects or problems (rev symptoms of hypotension)       Relevant Medications   lisinopril (ZESTRIL) 40 MG tablet       Follow Up Instructions: Increase your lisinopril to 40 mg daily (that is 2 of the 20 mg pills) I will send in the 40 mg pills so when you refill you will only  need one pill daily  Drink fluids Avoid excess sodium /mostly in processed foods and cured meats (like bacon) Keep exercising Please alert [place] or seek care if you develop chest pain or shortness of breath  Also call if you have any side effects or problems with the lisinopril   We will see you back in July as planned   I discussed the assessment and treatment plan with the patient. The patient was provided an opportunity to ask questions and all were answered. The patient agreed with the plan and demonstrated an understanding of the instructions.   The patient was advised to call back or seek an in-person evaluation if the symptoms worsen or if the condition fails to improve as anticipated.  I provided 17 minutes of non-face-to-face time during this encounter.   Loura Pardon, MD

## 2019-03-16 NOTE — Patient Instructions (Signed)
Increase your lisinopril to 40 mg daily (that is 2 of the 20 mg pills) I will send in the 40 mg pills so when you refill you will only need one pill daily  Drink fluids Avoid excess sodium /mostly in processed foods and cured meats (like bacon) Keep exercising Please alert [place] or seek care if you develop chest pain or shortness of breath  Also call if you have any side effects or problems with the lisinopril   We will see you back in July as planned

## 2019-03-16 NOTE — Assessment & Plan Note (Signed)
bp has been trending up and it was very high at the fire station yesterday ED eval is very reassuring  Reviewed hospital records, lab results and studies in detail   Pt has been eating more processed/salty foods and plans to change that He exercises regularly  We will increase lisinopril from 20 to 40 mg once daily  If not effective-can add a diuretic He has f/u next month but will let us know if he wants a nurse visit for bp check before then  inst to call if side effects or problems (rev symptoms of hypotension)

## 2019-03-24 ENCOUNTER — Other Ambulatory Visit: Payer: Self-pay | Admitting: Family Medicine

## 2019-05-09 ENCOUNTER — Ambulatory Visit (INDEPENDENT_AMBULATORY_CARE_PROVIDER_SITE_OTHER): Payer: Medicare Other

## 2019-05-09 VITALS — Ht 71.5 in | Wt 218.0 lb

## 2019-05-09 DIAGNOSIS — Z0000 Encounter for general adult medical examination without abnormal findings: Secondary | ICD-10-CM | POA: Diagnosis not present

## 2019-05-09 NOTE — Patient Instructions (Signed)
Jamie Byrd , Thank you for taking time to come for your Medicare Wellness Visit. I appreciate your ongoing commitment to your health goals. Please review the following plan we discussed and let me know if I can assist you in the future.   Screening recommendations/referrals: Colonoscopy: not required Recommended yearly ophthalmology/optometry visit for glaucoma screening and checkup Recommended yearly dental visit for hygiene and checkup  Vaccinations: Influenza vaccine: 07/2018 Pneumococcal vaccine: 11/2014 Tdap vaccine: 08/2009 Shingles vaccine: discussed    Advanced directives: Advance directive discussed with you today. Even though you declined this today please call our office should you change your mind and we can give you the proper paperwork for you to fill out.   Conditions/risks identified: overweight  Next appointment: 05/17/2019 at 2:30  Preventive Care 8 Years and Older, Male Preventive care refers to lifestyle choices and visits with your health care provider that can promote health and wellness. What does preventive care include?  A yearly physical exam. This is also called an annual well check.  Dental exams once or twice a year.  Routine eye exams. Ask your health care provider how often you should have your eyes checked.  Personal lifestyle choices, including:  Daily care of your teeth and gums.  Regular physical activity.  Eating a healthy diet.  Avoiding tobacco and drug use.  Limiting alcohol use.  Practicing safe sex.  Taking low doses of aspirin every day.  Taking vitamin and mineral supplements as recommended by your health care provider. What happens during an annual well check? The services and screenings done by your health care provider during your annual well check will depend on your age, overall health, lifestyle risk factors, and family history of disease. Counseling  Your health care provider may ask you questions about your:   Alcohol use.  Tobacco use.  Drug use.  Emotional well-being.  Home and relationship well-being.  Sexual activity.  Eating habits.  History of falls.  Memory and ability to understand (cognition).  Work and work Statistician. Screening  You may have the following tests or measurements:  Height, weight, and BMI.  Blood pressure.  Lipid and cholesterol levels. These may be checked every 5 years, or more frequently if you are over 65 years old.  Skin check.  Lung cancer screening. You may have this screening every year starting at age 54 if you have a 30-pack-year history of smoking and currently smoke or have quit within the past 15 years.  Fecal occult blood test (FOBT) of the stool. You may have this test every year starting at age 24.  Flexible sigmoidoscopy or colonoscopy. You may have a sigmoidoscopy every 5 years or a colonoscopy every 10 years starting at age 70.  Prostate cancer screening. Recommendations will vary depending on your family history and other risks.  Hepatitis C blood test.  Hepatitis B blood test.  Sexually transmitted disease (STD) testing.  Diabetes screening. This is done by checking your blood sugar (glucose) after you have not eaten for a while (fasting). You may have this done every 1-3 years.  Abdominal aortic aneurysm (AAA) screening. You may need this if you are a current or former smoker.  Osteoporosis. You may be screened starting at age 49 if you are at high risk. Talk with your health care provider about your test results, treatment options, and if necessary, the need for more tests. Vaccines  Your health care provider may recommend certain vaccines, such as:  Influenza vaccine. This is recommended every  year.  Tetanus, diphtheria, and acellular pertussis (Tdap, Td) vaccine. You may need a Td booster every 10 years.  Zoster vaccine. You may need this after age 42.  Pneumococcal 13-valent conjugate (PCV13) vaccine. One dose is  recommended after age 19.  Pneumococcal polysaccharide (PPSV23) vaccine. One dose is recommended after age 72. Talk to your health care provider about which screenings and vaccines you need and how often you need them. This information is not intended to replace advice given to you by your health care provider. Make sure you discuss any questions you have with your health care provider. Document Released: 10/26/2015 Document Revised: 06/18/2016 Document Reviewed: 07/31/2015 Elsevier Interactive Patient Education  2017 [employer] Prevention in the Home Falls can cause injuries. They can happen to people of all ages. There are many things you can do to make your home safe and to help prevent falls. What can I do on the outside of my home?  Regularly fix the edges of walkways and driveways and fix any cracks.  Remove anything that might make you trip as you walk through a door, such as a raised step or threshold.  Trim any bushes or trees on the path to your home.  Use bright outdoor lighting.  Clear any walking paths of anything that might make someone trip, such as rocks or tools.  Regularly check to see if handrails are loose or broken. Make sure that both sides of any steps have handrails.  Any raised decks and porches should have guardrails on the edges.  Have any leaves, snow, or ice cleared regularly.  Use sand or salt on walking paths during winter.  Clean up any spills in your garage right away. This includes oil or grease spills. What can I do in the bathroom?  Use night lights.  Install grab bars by the toilet and in the tub and shower. Do not use towel bars as grab bars.  Use non-skid mats or decals in the tub or shower.  If you need to sit down in the shower, use a plastic, non-slip stool.  Keep the floor dry. Clean up any water that spills on the floor as soon as it happens.  Remove soap buildup in the tub or shower regularly.  Attach bath mats  securely with double-sided non-slip rug tape.  Do not have throw rugs and other things on the floor that can make you trip. What can I do in the bedroom?  Use night lights.  Make sure that you have a light by your bed that is easy to reach.  Do not use any sheets or blankets that are too big for your bed. They should not hang down onto the floor.  Have a firm chair that has side arms. You can use this for support while you get dressed.  Do not have throw rugs and other things on the floor that can make you trip. What can I do in the kitchen?  Clean up any spills right away.  Avoid walking on wet floors.  Keep items that you use a lot in easy-to-reach places.  If you need to reach something above you, use a strong step stool that has a grab bar.  Keep electrical cords out of the way.  Do not use floor polish or wax that makes floors slippery. If you must use wax, use non-skid floor wax.  Do not have throw rugs and other things on the floor that can make you trip. What can  I do with my stairs?  Do not leave any items on the stairs.  Make sure that there are handrails on both sides of the stairs and use them. Fix handrails that are broken or loose. Make sure that handrails are as long as the stairways.  Check any carpeting to make sure that it is firmly attached to the stairs. Fix any carpet that is loose or worn.  Avoid having throw rugs at the top or bottom of the stairs. If you do have throw rugs, attach them to the floor with carpet tape.  Make sure that you have a light switch at the top of the stairs and the bottom of the stairs. If you do not have them, ask someone to add them for you. What else can I do to help prevent falls?  Wear shoes that:  Do not have high heels.  Have rubber bottoms.  Are comfortable and fit you well.  Are closed at the toe. Do not wear sandals.  If you use a stepladder:  Make sure that it is fully opened. Do not climb a closed  stepladder.  Make sure that both sides of the stepladder are locked into place.  Ask someone to hold it for you, if possible.  Clearly mark and make sure that you can see:  Any grab bars or handrails.  First and last steps.  Where the edge of each step is.  Use tools that help you move around (mobility aids) if they are needed. These include:  Canes.  Walkers.  Scooters.  Crutches.  Turn on the lights when you go into a dark area. Replace any light bulbs as soon as they burn out.  Set up your furniture so you have a clear path. Avoid moving your furniture around.  If any of your floors are uneven, fix them.  If there are any pets around you, be aware of where they are.  Review your medicines with your doctor. Some medicines can make you feel dizzy. This can increase your chance of falling. Ask your doctor what other things that you can do to help prevent falls. This information is not intended to replace advice given to you by your health care provider. Make sure you discuss any questions you have with your health care provider. Document Released: 07/26/2009 Document Revised: 03/06/2016 Document Reviewed: 11/03/2014 Elsevier Interactive Patient Education  2017 [employer].

## 2019-05-09 NOTE — Progress Notes (Addendum)
Subjective:   Jamie Byrd is a 85 y.o. male who presents for Medicare Annual/Subsequent preventive examination.  This visit type was conducted due to national recommendations for restrictions regarding the COVID-19 Pandemic (e.g. social distancing). This format is felt to be most appropriate for this patient at this time. All issues noted in this document were discussed and addressed. No physical exam was performed (except for noted visual exam findings with Video Visits). This patient, Jamie Byrd, has given permission to perform this visit via telephone. Vital signs may be absent or patient reported.  Patient location:  At home  Nurse location:  At home     Review of Systems:  n/a Cardiac Risk Factors include: advanced age (>25men, >75 women);male gender;hypertension     Objective:    Vitals: Ht 5' 11.5" (1.816 m) Comment: per patient  Wt 218 lb (98.9 kg) Comment: per patient  BMI 29.98 kg/m   Body mass index is 29.98 kg/m.  Advanced Directives 05/09/2019 03/15/2019 04/29/2018 12/18/2017 03/23/2017 12/17/2016 12/13/2015  Does Patient Have a Medical Advance Directive? No No No No No No No  Would patient like information on creating a medical advance directive? - No - Patient declined - Yes (MAU/Ambulatory/Procedural Areas - Information given) No - Patient declined - No - patient declined information    Tobacco Social History   Tobacco Use  Smoking Status Former Smoker  . Quit date: 10/13/1968  . Years since quitting: 50.6  Smokeless Tobacco Never Used     Counseling given: Not Answered   Clinical Intake:  Pre-visit preparation completed: Yes  Pain : No/denies pain     Nutritional Status: BMI 25 -29 Overweight Nutritional Risks: None Diabetes: No  How often do you need to have someone help you when you read instructions, pamphlets, or other written materials from your doctor or pharmacy?: 5 - Always(wife reads to him since he has decreased vision) What is the  last grade level you completed in school?: 5th grade  Interpreter Needed?: No  Information entered by :: NAllen LPN  Past Medical History:  Diagnosis Date  . BPH (benign prostatic hypertrophy)   . Difficulty reading   . Difficulty seeing    Difficulty in seeing certain things and reading/ not legally blind!  . Gout   . History of shingles   . Hypertension   . Hypothyroidism   . Macular degeneration    Past Surgical History:  Procedure Laterality Date  . COLONOSCOPY    . POLYPECTOMY     Family History  Problem Relation Age of Onset  . Heart disease Mother        CABG  . Heart disease Father        CAD  . Cancer Father        lung CA  . Cancer Brother        lung CA   Social History   Socioeconomic History  . Marital status: Married    Spouse name: Not on file  . Number of children: Not on file  . Years of education: Not on file  . Highest education level: Not on file  Occupational History  . Not on file  Social Needs  . Financial resource strain: Not hard at all  . Food insecurity    Worry: Never true    Inability: Never true  . Transportation needs    Medical: No    Non-medical: No  Tobacco Use  . Smoking status: Former Smoker  Quit date: 10/13/1968    Years since quitting: 50.6  . Smokeless tobacco: Never Used  Substance and Sexual Activity  . Alcohol use: No    Comment: no alcohol in 20 years  . Drug use: No  . Sexual activity: Not Currently  Lifestyle  . Physical activity    Days per week: 7 days    Minutes per session: 30 min  . Stress: Not at all  Relationships  . Social Herbalist on phone: Not on file    Gets together: Not on file    Attends religious service: Not on file    Active member of club or organization: Not on file    Attends meetings of clubs or organizations: Not on file    Relationship status: Not on file  Other Topics Concern  . Not on file  Social History Narrative  . Not on file    Outpatient Encounter  Medications as of 05/09/2019  Medication Sig  . allopurinol (ZYLOPRIM) 100 MG tablet TAKE 1 TABLET BY MOUTH ONCE A DAY  . levothyroxine (SYNTHROID) 75 MCG tablet Take 1 tablet (75 mcg total) by mouth daily before breakfast.  . lisinopril (ZESTRIL) 40 MG tablet Take 1 tablet (40 mg total) by mouth daily.  . meloxicam (MOBIC) 7.5 MG tablet Take 1 tablet (7.5 mg total) by mouth daily. With food for 2 weeks (Patient not taking: Reported on 03/15/2019)  . methocarbamol (ROBAXIN) 500 MG tablet Take 1 tablet (500 mg total) by mouth at bedtime as needed for muscle spasms (as needed for back pain). Caution of sedation (Patient not taking: Reported on 03/15/2019)  . Multiple Vitamins-Minerals (PRESERVISION AREDS 2 PO) Take 1 tablet by mouth daily.   No facility-administered encounter medications on file as of 05/09/2019.     Activities of Daily Living In your present state of health, do you have any difficulty performing the following activities: 05/09/2019  Hearing? Y  Comment a little  Vision? Y  Difficulty concentrating or making decisions? N  Walking or climbing stairs? N  Dressing or bathing? N  Doing errands, shopping? Y  Comment someone goes with  Preparing Food and eating ? N  Using the Toilet? N  In the past six months, have you accidently leaked urine? N  Do you have problems with loss of bowel control? N  Managing your Medications? N  Managing your Finances? N  Housekeeping or managing your Housekeeping? N  Some recent data might be hidden    Patient Care Team: Tower, Wynelle Fanny, MD as PCP - Kennon Portela, MD as Consulting Physician (Dermatology)   Assessment:   This is a routine wellness examination for Jamie Byrd.  Exercise Activities and Dietary recommendations Current Exercise Habits: Home exercise routine, Type of exercise: walking, Time (Minutes): 30, Frequency (Times/Week): 7, Weekly Exercise (Minutes/Week): 210  Goals    . Exercise 3x per week (30 min per time)      Starting 12/18/2017, I will continue to walk up to 1/2 mile daily.     . Increase physical activity     Starting 12/17/2016, I will continue to walk at least 30 min daily.        Fall Risk Fall Risk  05/09/2019 12/18/2017 12/17/2016 12/13/2015 12/11/2014  Falls in the past year? 0 No Yes No No  Comment - - pt fell while walking on ice; no medical treatment - -  Number falls in past yr: - - 1 - -  Injury with Fall? - -  No - -  Risk for fall due to : Medication side effect;Impaired vision - - Impaired vision -   Is the patient's home free of loose throw rugs in walkways, pet beds, electrical cords, etc?   yes      Grab bars in the bathroom? yes      Handrails on the stairs?   n/a      Adequate lighting?   yes  Timed Get Up and Go Performed: n/a  Depression Screen PHQ 2/9 Scores 05/09/2019 12/18/2017 12/17/2016 12/13/2015  PHQ - 2 Score 0 0 0 0  PHQ- 9 Score 0 0 - -    Cognitive Function MMSE - Mini Mental State Exam 05/09/2019 12/18/2017 12/17/2016 12/13/2015  Orientation to time 5 5 5 5   Orientation to Place 5 5 5 5   Registration 3 3 3 3   Attention/ Calculation 0 0 0 0  Attention/Calculation-comments Patient can not spell - - -  Recall 3 3 3  0  Language- name 2 objects 0 0 0 0  Language- repeat 1 1 1 1   Language- follow 3 step command 0 3 3 3   Language- read & follow direction 0 0 0 1  Write a sentence 0 0 0 0  Copy design 0 0 0 0  Total score 17 20 20 18    Mini Cog  Mini-Cog screen was completed. Maximum score is 22. A value of 0 denotes this part of the MMSE was not completed or the patient failed this part of the Mini-Cog screening.       Immunization History  Administered Date(s) Administered  . Influenza Split 09/17/2011, 07/23/2012  . Influenza Whole 08/25/2006, 09/10/2009, 09/25/2010  . Influenza, High Dose Seasonal PF 07/29/2018  . Influenza, Seasonal, Injecte, Preservative Fre 06/28/2015  . Influenza,inj,Quad PF,6+ Mos 07/07/2013, 07/19/2014, 08/08/2016, 07/16/2017  .  Influenza-Unspecified 06/28/2015  . Pneumococcal Conjugate-13 12/11/2014  . Pneumococcal Polysaccharide-23 09/17/2011  . Td 07/14/1999, 09/10/2009    Qualifies for Shingles Vaccine? yes  Screening Tests Health Maintenance  Topic Date Due  . INFLUENZA VACCINE  05/14/2019  . TETANUS/TDAP  09/11/2019  . PNA vac Low Risk Adult  Completed   Cancer Screenings: Lung: Low Dose CT Chest recommended if Age 26-80 years, 30 pack-year currently smoking OR have quit w/in 15years. Patient does not qualify. Colorectal: not required  Additional Screenings:  Hepatitis C Screening:n/a      Plan:   Patient to continue doing daily walks.  I have personally reviewed and noted the following in the patient's chart:   . Medical and social history . Use of alcohol, tobacco or illicit drugs  . Current medications and supplements . Functional ability and status . Nutritional status . Physical activity . Advanced directives . List of other physicians . Hospitalizations, surgeries, and ER visits in previous 12 months . Vitals . Screenings to include cognitive, depression, and falls . Referrals and appointments  In addition, I have reviewed and discussed with patient certain preventive protocols, quality metrics, and best practice recommendations. A written personalized care plan for preventive services as well as general preventive health recommendations were provided to patient.     Kellie Simmering, LPN  [id number]

## 2019-05-09 NOTE — Progress Notes (Signed)
PCP notes:  Health Maintenance:  No gaps  Abnormal Screenings:  None  Patient concerns:  None  Nurse concerns:  None  Next PCP appt.: 05/17/2019 at 2:30

## 2019-05-17 ENCOUNTER — Ambulatory Visit (INDEPENDENT_AMBULATORY_CARE_PROVIDER_SITE_OTHER): Payer: Medicare Other | Admitting: Family Medicine

## 2019-05-17 ENCOUNTER — Encounter: Payer: Self-pay | Admitting: Family Medicine

## 2019-05-17 ENCOUNTER — Other Ambulatory Visit: Payer: Self-pay

## 2019-05-17 VITALS — BP 138/78 | HR 68 | Temp 98.2°F | Ht 71.0 in | Wt 215.1 lb

## 2019-05-17 DIAGNOSIS — I10 Essential (primary) hypertension: Secondary | ICD-10-CM | POA: Diagnosis not present

## 2019-05-17 DIAGNOSIS — H353130 Nonexudative age-related macular degeneration, bilateral, stage unspecified: Secondary | ICD-10-CM

## 2019-05-17 DIAGNOSIS — M1A9XX0 Chronic gout, unspecified, without tophus (tophi): Secondary | ICD-10-CM

## 2019-05-17 DIAGNOSIS — N400 Enlarged prostate without lower urinary tract symptoms: Secondary | ICD-10-CM

## 2019-05-17 DIAGNOSIS — E786 Lipoprotein deficiency: Secondary | ICD-10-CM | POA: Diagnosis not present

## 2019-05-17 DIAGNOSIS — R413 Other amnesia: Secondary | ICD-10-CM | POA: Diagnosis not present

## 2019-05-17 DIAGNOSIS — H9113 Presbycusis, bilateral: Secondary | ICD-10-CM | POA: Diagnosis not present

## 2019-05-17 DIAGNOSIS — E039 Hypothyroidism, unspecified: Secondary | ICD-10-CM | POA: Diagnosis not present

## 2019-05-17 DIAGNOSIS — R7303 Prediabetes: Secondary | ICD-10-CM

## 2019-05-17 NOTE — Assessment & Plan Note (Signed)
Mini cog score 17/17 in amw -reassuring

## 2019-05-17 NOTE — Assessment & Plan Note (Signed)
Disc goals for lipids and reasons to control them Rev last labs with pt Rev low sat fat diet in detail Labs today  Enc exercise/fish oil to raise HDL

## 2019-05-17 NOTE — Assessment & Plan Note (Signed)
bp in fair control at this time  BP Readings from Last 1 Encounters:  05/17/19 138/78   No changes needed Most recent labs reviewed  Disc lifstyle change with low sodium diet and exercise  Labs today

## 2019-05-17 NOTE — Progress Notes (Signed)
Subjective:    Patient ID: Jamie Byrd, male    DOB: October 13, 1934, 85 y.o.   MRN: [id number]  HPI Here for annual f/u of chronic medical problems   Staying busy at home  Still walks for exercise    Had amw on 7/27-no gaps or concerns   Wt Readings from Last 3 Encounters:  05/17/19 215 lb 1 oz (97.6 kg)  05/09/19 218 lb (98.9 kg)  03/16/19 220 lb (99.8 kg)  wt is down 3 lb  Eating regularly -appetite is ok / does not eat large amounts  Drinking a lot of fluids Does not work a lot in the heat- does everything in the am  30.00 kg/m   Zoster status  Has had shingles once before  Interested in shingrix if affordable   Flu vaccine 10/19  Tetanus vaccine Td 11/10  pna vaccines complete  Nl colonoscopy 2014 No bowel changes-doing fine   Prostate health  Has h/o BPH Lab Results  Component Value Date   PSA 0.59 12/18/2017   PSA 0.56 12/17/2016   PSA 0.48 12/13/2015   nocturia 2 times per night maximum No change in frequency during the day  Able to empty ok  No medicines for this   Father and brother had lung cancer   Gout hx-no flares  On allopurinol Needs labs for uric acid   Prediabetes Lab Results  Component Value Date   HGBA1C 5.1 12/18/2017   Due for labs today   bp is stable today  No cp or palpitations or headaches or edema  No side effects to medicines  BP Readings from Last 3 Encounters:  05/17/19 138/78  03/15/19 (!) 147/91  05/21/18 (!) 146/72     Hypothyroidism  Pt has no clinical changes (does not feel any different)  No change in energy level/ hair or skin/ edema and no tremor Lab Results  Component Value Date   TSH 4.73 (H) 12/18/2017     Last cholesterol Lab Results  Component Value Date   CHOL 119 12/18/2017   HDL 30.10 (L) 12/18/2017   LDLCALC 62 12/18/2017   TRIG 133.0 12/18/2017   CHOLHDL 4 12/18/2017   Patient Active Problem List   Diagnosis Date Noted  . Low back pain 05/21/2018  . Macular degeneration 12/18/2017   . Memory loss, short term 12/14/2015  . Hearing loss of aging 12/09/2013  . Encounter for Medicare annual wellness exam 12/09/2013  . Personal history of colonic polyps 12/08/2012  . Tinea versicolor 09/17/2011  . Prostate cancer screening 09/10/2011  . Hyperkalemia 09/25/2010  . Prediabetes 09/25/2010  . Low HDL (under 40) 11/05/2009  . Hypothyroidism 07/28/2007  . Gout 07/28/2007  . ERECTILE DYSFUNCTION 07/28/2007  . Essential hypertension 07/28/2007  . Benign prostatic hyperplasia 07/28/2007   Past Medical History:  Diagnosis Date  . BPH (benign prostatic hypertrophy)   . Difficulty reading   . Difficulty seeing    Difficulty in seeing certain things and reading/ not legally blind!  . Gout   . History of shingles   . Hypertension   . Hypothyroidism   . Macular degeneration    Past Surgical History:  Procedure Laterality Date  . COLONOSCOPY    . POLYPECTOMY     Social History   Tobacco Use  . Smoking status: Former Smoker    Quit date: 10/13/1968    Years since quitting: 50.6  . Smokeless tobacco: Never Used  Substance Use Topics  . Alcohol use: No  Comment: no alcohol in 20 years  . Drug use: No   Family History  Problem Relation Age of Onset  . Heart disease Mother        CABG  . Heart disease Father        CAD  . Cancer Father        lung CA  . Cancer Brother        lung CA   No Known Allergies Current Outpatient Medications on File Prior to Visit  Medication Sig Dispense Refill  . allopurinol (ZYLOPRIM) 100 MG tablet TAKE 1 TABLET BY MOUTH ONCE A DAY 90 tablet 0  . levothyroxine (SYNTHROID) 75 MCG tablet Take 1 tablet (75 mcg total) by mouth daily before breakfast. 90 tablet 0  . lisinopril (ZESTRIL) 40 MG tablet Take 1 tablet (40 mg total) by mouth daily. 90 tablet 3  . Multiple Vitamins-Minerals (PRESERVISION AREDS 2 PO) Take 1 tablet by mouth daily.     No current facility-administered medications on file prior to visit.      Review of  Systems  Constitutional: Negative for activity change, appetite change, fatigue, fever and unexpected weight change.  HENT: Negative for congestion, rhinorrhea, sore throat and trouble swallowing.   Eyes: Negative for pain, redness, itching and visual disturbance.  Respiratory: Negative for cough, chest tightness, shortness of breath and wheezing.   Cardiovascular: Negative for chest pain and palpitations.  Gastrointestinal: Negative for abdominal pain, blood in stool, constipation, diarrhea and nausea.  Endocrine: Negative for cold intolerance, heat intolerance, polydipsia and polyuria.  Genitourinary: Negative for difficulty urinating, dysuria, frequency, hematuria and urgency.  Musculoskeletal: Negative for arthralgias, joint swelling and myalgias.       Aches and pains  Skin: Negative for pallor and rash.  Neurological: Negative for dizziness, tremors, weakness, numbness and headaches.  Hematological: Negative for adenopathy. Does not bruise/bleed easily.  Psychiatric/Behavioral: Negative for decreased concentration and dysphoric mood. The patient is not nervous/anxious.        Objective:   Physical Exam Constitutional:      General: He is not in acute distress.    Appearance: Normal appearance. He is well-developed. He is obese. He is not ill-appearing or diaphoretic.  HENT:     Head: Normocephalic and atraumatic.     Right Ear: Tympanic membrane, ear canal and external ear normal.     Left Ear: Tympanic membrane, ear canal and external ear normal.     Nose: Nose normal. No rhinorrhea.     Mouth/Throat:     Mouth: Mucous membranes are moist.     Pharynx: Oropharynx is clear. No posterior oropharyngeal erythema.  Eyes:     General: No scleral icterus.       Right eye: No discharge.        Left eye: No discharge.     Conjunctiva/sclera: Conjunctivae normal.     Pupils: Pupils are equal, round, and reactive to light.  Neck:     Musculoskeletal: Normal range of motion and neck  supple. No neck rigidity or muscular tenderness.     Thyroid: No thyromegaly.     Vascular: No carotid bruit or JVD.  Cardiovascular:     Rate and Rhythm: Normal rate and regular rhythm.     Pulses: Normal pulses.     Heart sounds: Normal heart sounds. No gallop.   Pulmonary:     Effort: Pulmonary effort is normal. No respiratory distress.     Breath sounds: Normal breath sounds. No wheezing or rales.  Comments: Good air exch Chest:     Chest wall: No tenderness.  Abdominal:     General: Bowel sounds are normal. There is no distension or abdominal bruit.     Palpations: Abdomen is soft. There is no mass.     Tenderness: There is no abdominal tenderness.     Hernia: No hernia is present.  Musculoskeletal:        General: No tenderness.     Right lower leg: No edema.     Left lower leg: No edema.  Lymphadenopathy:     Cervical: No cervical adenopathy.  Skin:    General: Skin is warm and dry.     Coloration: Skin is not pale.     Findings: No erythema or rash.     Comments: Solar lentigines diffusely Scattered sks  Also aks  Solar damage/aging   Neurological:     Mental Status: He is alert.     Cranial Nerves: No cranial nerve deficit.     Motor: No abnormal muscle tone.     Coordination: Coordination normal.     Gait: Gait normal.     Deep Tendon Reflexes: Reflexes normal.  Psychiatric:        Mood and Affect: Mood normal.           Assessment & Plan:   Problem List Items Addressed This Visit      Cardiovascular and Mediastinum   Essential hypertension - Primary    bp in fair control at this time  BP Readings from Last 1 Encounters:  05/17/19 138/78   No changes needed Most recent labs reviewed  Disc lifstyle change with low sodium diet and exercise  Labs today       Relevant Orders   CBC with Differential/Platelet   Comprehensive metabolic panel   Lipid panel   TSH     Endocrine   Hypothyroidism    Hypothyroidism  Pt has no clinical changes  No change in energy level/ hair or skin/ edema and no tremor TSH today      Relevant Orders   TSH     Nervous and Auditory   Hearing loss of aging    Continues to hear poorly  Not interested in hearing aides        Genitourinary   Benign prostatic hyperplasia    No clinical changes  Nocturia times 2 Does not feel he needs medication         Other   Low HDL (under 40)    Disc goals for lipids and reasons to control them Rev last labs with pt Rev low sat fat diet in detail Labs today  Enc exercise/fish oil to raise HDL      Gout    No flares on allopurinol Counseled on fluid intake  Uric acid level today      Relevant Orders   Uric acid   Prediabetes    A1C today  disc imp of low glycemic diet and wt loss to prevent DM2       Relevant Orders   Hemoglobin A1c   Memory loss, short term    Mini cog score 17/17 in amw -reassuring      Macular degeneration    Pt continues to see opthy Dry macular degeneration  Has trouble reading

## 2019-05-17 NOTE — Assessment & Plan Note (Signed)
No flares on allopurinol Counseled on fluid intake  Uric acid level today

## 2019-05-17 NOTE — Assessment & Plan Note (Signed)
No clinical changes  Nocturia times 2 Does not feel he needs medication

## 2019-05-17 NOTE — Assessment & Plan Note (Signed)
Hypothyroidism  °Pt has no clinical changes °No change in energy level/ hair or skin/ edema and no tremor °TSH today ° °

## 2019-05-17 NOTE — Patient Instructions (Addendum)
If you are interested in the new shingles vaccine (Shingrix) - call your local pharmacy to check on coverage and availability  If affordable, get on a wait list at your pharmacy to get the vaccine.  Get a flu shot in the fall   Keep up your eye care  Re schedule your dermatology appointment when you feel safe going  Stay active mind and body   Labs today   I'm glad you are doing so well

## 2019-05-17 NOTE — Assessment & Plan Note (Signed)
A1C today  disc imp of low glycemic diet and wt loss to prevent DM2  

## 2019-05-17 NOTE — Assessment & Plan Note (Signed)
Pt continues to see opthy Dry macular degeneration  Has trouble reading

## 2019-05-17 NOTE — Assessment & Plan Note (Signed)
Continues to hear poorly  Not interested in hearing aides

## 2019-05-18 DIAGNOSIS — H43813 Vitreous degeneration, bilateral: Secondary | ICD-10-CM | POA: Diagnosis not present

## 2019-05-18 DIAGNOSIS — H35423 Microcystoid degeneration of retina, bilateral: Secondary | ICD-10-CM | POA: Diagnosis not present

## 2019-05-18 DIAGNOSIS — H353134 Nonexudative age-related macular degeneration, bilateral, advanced atrophic with subfoveal involvement: Secondary | ICD-10-CM | POA: Diagnosis not present

## 2019-05-18 DIAGNOSIS — H31092 Other chorioretinal scars, left eye: Secondary | ICD-10-CM | POA: Diagnosis not present

## 2019-05-18 LAB — URIC ACID: Uric Acid, Serum: 4.5 mg/dL (ref 4.0–7.8)

## 2019-05-18 LAB — CBC WITH DIFFERENTIAL/PLATELET
Basophils Absolute: 0.1 10*3/uL (ref 0.0–0.1)
Basophils Relative: 1.1 % (ref 0.0–3.0)
Eosinophils Absolute: 0.6 10*3/uL (ref 0.0–0.7)
Eosinophils Relative: 10.8 % — ABNORMAL HIGH (ref 0.0–5.0)
HCT: 42.8 % (ref 39.0–52.0)
Hemoglobin: 14.4 g/dL (ref 13.0–17.0)
Lymphocytes Relative: 35.9 % (ref 12.0–46.0)
Lymphs Abs: 2 10*3/uL (ref 0.7–4.0)
MCHC: 33.7 g/dL (ref 30.0–36.0)
MCV: 98.8 fl (ref 78.0–100.0)
Monocytes Absolute: 0.5 10*3/uL (ref 0.1–1.0)
Monocytes Relative: 8.3 % (ref 3.0–12.0)
Neutro Abs: 2.5 10*3/uL (ref 1.4–7.7)
Neutrophils Relative %: 43.9 % (ref 43.0–77.0)
Platelets: 190 10*3/uL (ref 150.0–400.0)
RBC: 4.34 Mil/uL (ref 4.22–5.81)
RDW: 14.5 % (ref 11.5–15.5)
WBC: 5.6 10*3/uL (ref 4.0–10.5)

## 2019-05-18 LAB — COMPREHENSIVE METABOLIC PANEL
ALT: 10 U/L (ref 0–53)
AST: 18 U/L (ref 0–37)
Albumin: 4.5 g/dL (ref 3.5–5.2)
Alkaline Phosphatase: 105 U/L (ref 39–117)
BUN: 14 mg/dL (ref 6–23)
CO2: 29 mEq/L (ref 19–32)
Calcium: 9.8 mg/dL (ref 8.4–10.5)
Chloride: 102 mEq/L (ref 96–112)
Creatinine, Ser: 0.96 mg/dL (ref 0.40–1.50)
GFR: 74.86 mL/min (ref >60.00)
Glucose, Bld: 93 mg/dL (ref 70–99)
Potassium: 4.6 mEq/L (ref 3.5–5.1)
Sodium: 137 mEq/L (ref 135–145)
Total Bilirubin: 0.9 mg/dL (ref 0.2–1.2)
Total Protein: 6.9 g/dL (ref 6.0–8.3)

## 2019-05-18 LAB — LIPID PANEL
Cholesterol: 111 mg/dL (ref 0–200)
HDL: 29.7 mg/dL — ABNORMAL LOW (ref >39.00)
LDL Cholesterol: 61 mg/dL (ref 0–99)
NonHDL: 81.56
Total CHOL/HDL Ratio: 4
Triglycerides: 101 mg/dL (ref 0.0–149.0)
VLDL: 20.2 mg/dL (ref 0.0–40.0)

## 2019-05-18 LAB — TSH: TSH: 4.87 u[IU]/mL — ABNORMAL HIGH (ref 0.35–4.50)

## 2019-05-18 LAB — HEMOGLOBIN A1C: Hgb A1c MFr Bld: 5.2 % (ref 4.6–6.5)

## 2019-05-20 ENCOUNTER — Telehealth: Payer: Self-pay | Admitting: *Deleted

## 2019-05-20 NOTE — Telephone Encounter (Signed)
Left VM requesting pt to call the office back regarding lab results  

## 2019-05-24 NOTE — Telephone Encounter (Signed)
Addressed through result notes  

## 2019-05-26 DIAGNOSIS — D485 Neoplasm of uncertain behavior of skin: Secondary | ICD-10-CM | POA: Diagnosis not present

## 2019-05-26 DIAGNOSIS — B078 Other viral warts: Secondary | ICD-10-CM | POA: Diagnosis not present

## 2019-05-26 DIAGNOSIS — C44619 Basal cell carcinoma of skin of left upper limb, including shoulder: Secondary | ICD-10-CM | POA: Diagnosis not present

## 2019-05-26 DIAGNOSIS — Z85828 Personal history of other malignant neoplasm of skin: Secondary | ICD-10-CM | POA: Diagnosis not present

## 2019-05-26 DIAGNOSIS — L821 Other seborrheic keratosis: Secondary | ICD-10-CM | POA: Diagnosis not present

## 2019-05-26 DIAGNOSIS — L812 Freckles: Secondary | ICD-10-CM | POA: Diagnosis not present

## 2019-05-26 DIAGNOSIS — L570 Actinic keratosis: Secondary | ICD-10-CM | POA: Diagnosis not present

## 2019-06-28 ENCOUNTER — Other Ambulatory Visit: Payer: Self-pay | Admitting: Family Medicine

## 2019-07-15 ENCOUNTER — Ambulatory Visit (INDEPENDENT_AMBULATORY_CARE_PROVIDER_SITE_OTHER): Payer: Medicare Other

## 2019-07-15 DIAGNOSIS — Z23 Encounter for immunization: Secondary | ICD-10-CM | POA: Diagnosis not present

## 2019-09-07 ENCOUNTER — Other Ambulatory Visit: Payer: Self-pay

## 2019-09-27 ENCOUNTER — Other Ambulatory Visit: Payer: Self-pay | Admitting: Family Medicine

## 2019-11-15 DIAGNOSIS — H35433 Paving stone degeneration of retina, bilateral: Secondary | ICD-10-CM | POA: Diagnosis not present

## 2019-11-15 DIAGNOSIS — H35423 Microcystoid degeneration of retina, bilateral: Secondary | ICD-10-CM | POA: Diagnosis not present

## 2019-11-15 DIAGNOSIS — H353134 Nonexudative age-related macular degeneration, bilateral, advanced atrophic with subfoveal involvement: Secondary | ICD-10-CM | POA: Diagnosis not present

## 2019-11-15 DIAGNOSIS — H31092 Other chorioretinal scars, left eye: Secondary | ICD-10-CM | POA: Diagnosis not present

## 2019-11-28 DIAGNOSIS — D485 Neoplasm of uncertain behavior of skin: Secondary | ICD-10-CM | POA: Diagnosis not present

## 2019-11-28 DIAGNOSIS — C44519 Basal cell carcinoma of skin of other part of trunk: Secondary | ICD-10-CM | POA: Diagnosis not present

## 2019-11-28 DIAGNOSIS — L570 Actinic keratosis: Secondary | ICD-10-CM | POA: Diagnosis not present

## 2019-11-28 DIAGNOSIS — L820 Inflamed seborrheic keratosis: Secondary | ICD-10-CM | POA: Diagnosis not present

## 2019-11-28 DIAGNOSIS — C44619 Basal cell carcinoma of skin of left upper limb, including shoulder: Secondary | ICD-10-CM | POA: Diagnosis not present

## 2019-11-28 DIAGNOSIS — C4441 Basal cell carcinoma of skin of scalp and neck: Secondary | ICD-10-CM | POA: Diagnosis not present

## 2019-11-28 DIAGNOSIS — L821 Other seborrheic keratosis: Secondary | ICD-10-CM | POA: Diagnosis not present

## 2019-11-28 DIAGNOSIS — Z85828 Personal history of other malignant neoplasm of skin: Secondary | ICD-10-CM | POA: Diagnosis not present

## 2020-03-28 ENCOUNTER — Other Ambulatory Visit: Payer: Self-pay

## 2020-03-28 MED ORDER — LISINOPRIL 40 MG PO TABS: 40.0000 mg | ORAL_TABLET | Freq: Every day | ORAL | 0 refills | Status: DC

## 2020-03-28 MED ORDER — ALLOPURINOL 100 MG PO TABS: 100.0000 mg | ORAL_TABLET | Freq: Every day | ORAL | 0 refills | Status: DC

## 2020-05-15 DIAGNOSIS — H35423 Microcystoid degeneration of retina, bilateral: Secondary | ICD-10-CM | POA: Diagnosis not present

## 2020-05-15 DIAGNOSIS — H31092 Other chorioretinal scars, left eye: Secondary | ICD-10-CM | POA: Diagnosis not present

## 2020-05-15 DIAGNOSIS — H353134 Nonexudative age-related macular degeneration, bilateral, advanced atrophic with subfoveal involvement: Secondary | ICD-10-CM | POA: Diagnosis not present

## 2020-05-15 DIAGNOSIS — H35433 Paving stone degeneration of retina, bilateral: Secondary | ICD-10-CM | POA: Diagnosis not present

## 2020-05-15 LAB — HM DIABETES EYE EXAM

## 2020-05-17 ENCOUNTER — Other Ambulatory Visit: Payer: Self-pay

## 2020-05-17 ENCOUNTER — Ambulatory Visit (INDEPENDENT_AMBULATORY_CARE_PROVIDER_SITE_OTHER): Payer: Medicare Other | Admitting: Family Medicine

## 2020-05-17 ENCOUNTER — Encounter: Payer: Self-pay | Admitting: Family Medicine

## 2020-05-17 VITALS — BP 136/86 | HR 60 | Temp 97.5°F | Ht 70.75 in | Wt 207.5 lb

## 2020-05-17 DIAGNOSIS — N400 Enlarged prostate without lower urinary tract symptoms: Secondary | ICD-10-CM | POA: Diagnosis not present

## 2020-05-17 DIAGNOSIS — H6123 Impacted cerumen, bilateral: Secondary | ICD-10-CM

## 2020-05-17 DIAGNOSIS — M1A9XX0 Chronic gout, unspecified, without tophus (tophi): Secondary | ICD-10-CM

## 2020-05-17 DIAGNOSIS — E786 Lipoprotein deficiency: Secondary | ICD-10-CM | POA: Diagnosis not present

## 2020-05-17 DIAGNOSIS — H353130 Nonexudative age-related macular degeneration, bilateral, stage unspecified: Secondary | ICD-10-CM

## 2020-05-17 DIAGNOSIS — Z0000 Encounter for general adult medical examination without abnormal findings: Secondary | ICD-10-CM

## 2020-05-17 DIAGNOSIS — E039 Hypothyroidism, unspecified: Secondary | ICD-10-CM | POA: Diagnosis not present

## 2020-05-17 DIAGNOSIS — R413 Other amnesia: Secondary | ICD-10-CM

## 2020-05-17 DIAGNOSIS — R7303 Prediabetes: Secondary | ICD-10-CM | POA: Diagnosis not present

## 2020-05-17 DIAGNOSIS — H6120 Impacted cerumen, unspecified ear: Secondary | ICD-10-CM | POA: Insufficient documentation

## 2020-05-17 DIAGNOSIS — I10 Essential (primary) hypertension: Secondary | ICD-10-CM | POA: Diagnosis not present

## 2020-05-17 LAB — COMPREHENSIVE METABOLIC PANEL
ALT: 12 U/L (ref 0–53)
AST: 21 U/L (ref 0–37)
Albumin: 4.4 g/dL (ref 3.5–5.2)
Alkaline Phosphatase: 104 U/L (ref 39–117)
BUN: 14 mg/dL (ref 6–23)
CO2: 31 mEq/L (ref 19–32)
Calcium: 9.8 mg/dL (ref 8.4–10.5)
Chloride: 101 mEq/L (ref 96–112)
Creatinine, Ser: 0.9 mg/dL (ref 0.40–1.50)
GFR: 80.46 mL/min (ref >60.00)
Glucose, Bld: 91 mg/dL (ref 70–99)
Potassium: 4.9 mEq/L (ref 3.5–5.1)
Sodium: 134 mEq/L — ABNORMAL LOW (ref 135–145)
Total Bilirubin: 0.9 mg/dL (ref 0.2–1.2)
Total Protein: 6.9 g/dL (ref 6.0–8.3)

## 2020-05-17 LAB — CBC WITH DIFFERENTIAL/PLATELET
Basophils Absolute: 0.1 10*3/uL (ref 0.0–0.1)
Basophils Relative: 1.1 % (ref 0.0–3.0)
Eosinophils Absolute: 0.3 10*3/uL (ref 0.0–0.7)
Eosinophils Relative: 5.5 % — ABNORMAL HIGH (ref 0.0–5.0)
HCT: 41.9 % (ref 39.0–52.0)
Hemoglobin: 14.3 g/dL (ref 13.0–17.0)
Lymphocytes Relative: 33 % (ref 12.0–46.0)
Lymphs Abs: 2 10*3/uL (ref 0.7–4.0)
MCHC: 34.1 g/dL (ref 30.0–36.0)
MCV: 99.2 fl (ref 78.0–100.0)
Monocytes Absolute: 0.4 10*3/uL (ref 0.1–1.0)
Monocytes Relative: 6.5 % (ref 3.0–12.0)
Neutro Abs: 3.3 10*3/uL (ref 1.4–7.7)
Neutrophils Relative %: 53.9 % (ref 43.0–77.0)
Platelets: 207 10*3/uL (ref 150.0–400.0)
RBC: 4.22 Mil/uL (ref 4.22–5.81)
RDW: 14 % (ref 11.5–15.5)
WBC: 6.2 10*3/uL (ref 4.0–10.5)

## 2020-05-17 LAB — LIPID PANEL
Cholesterol: 105 mg/dL (ref 0–200)
HDL: 29.1 mg/dL — ABNORMAL LOW (ref >39.00)
LDL Cholesterol: 59 mg/dL (ref 0–99)
NonHDL: 76.34
Total CHOL/HDL Ratio: 4
Triglycerides: 89 mg/dL (ref 0.0–149.0)
VLDL: 17.8 mg/dL (ref 0.0–40.0)

## 2020-05-17 LAB — HEMOGLOBIN A1C: Hgb A1c MFr Bld: 5.2 % (ref 4.6–6.5)

## 2020-05-17 LAB — URIC ACID: Uric Acid, Serum: 4.6 mg/dL (ref 4.0–7.8)

## 2020-05-17 LAB — TSH: TSH: 4.16 u[IU]/mL (ref 0.35–4.50)

## 2020-05-17 MED ORDER — ALLOPURINOL 100 MG PO TABS: 100.0000 mg | ORAL_TABLET | Freq: Every day | ORAL | 3 refills | Status: DC

## 2020-05-17 MED ORDER — LISINOPRIL 40 MG PO TABS: 40.0000 mg | ORAL_TABLET | Freq: Every day | ORAL | 3 refills | Status: DC

## 2020-05-17 NOTE — Assessment & Plan Note (Signed)
Labs today  Disc goals for lipids and reasons to control them Rev last labs with pt Rev low sat fat diet in detail  Again recommended omega 3 supplement and exercise

## 2020-05-17 NOTE — Assessment & Plan Note (Signed)
No clinical changes in voiding Does not desire tx

## 2020-05-17 NOTE — Assessment & Plan Note (Signed)
No flares on allopurinol  Uric acid checked today

## 2020-05-17 NOTE — Assessment & Plan Note (Signed)
A1C with labs today  Some wt loss noted disc imp of low glycemic diet and wt loss to prevent DM2

## 2020-05-17 NOTE — Assessment & Plan Note (Signed)
Bilateral and worse on R Worsening his already poor hearing  Adv use of debrox three times per week as directed and then f/u1-2 mo for irrigation

## 2020-05-17 NOTE — Assessment & Plan Note (Signed)
No clinical changes TSH today and then will change dose if needed

## 2020-05-17 NOTE — Assessment & Plan Note (Addendum)
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

## 2020-05-17 NOTE — Assessment & Plan Note (Signed)
bp in fair control at this time  BP Readings from Last 1 Encounters:  05/17/20 136/86   No changes needed Most recent labs reviewed  Disc lifstyle change with low sodium diet and exercise

## 2020-05-17 NOTE — Assessment & Plan Note (Signed)
Mentally sharp today  No changes  Enc continued brain activity

## 2020-05-17 NOTE — Patient Instructions (Addendum)
Think about working on a living will  See the blue booklet I gave you to work on that - get it notarized and then get copy to [place]   Labs today   You have ear wax in both ears (worse on the Right)  Get debrox ear drops (or a similar product) at the drug store and use as directed in both ears 3 times per week Then follow up in 1-2 months and we can flush them out  This may help hearing

## 2020-05-17 NOTE — Progress Notes (Signed)
Subjective:    Patient ID: Jamie Byrd, male    DOB: [birth year], 86 y.o.   MRN: [id number]  This visit occurred during the SARS-CoV-2 public health emergency.  Safety protocols were in place, including screening questions prior to the visit, additional usage of staff PPE, and extensive cleaning of exam room while observing appropriate contact time as indicated for disinfecting solutions.    HPI Pt presents for amw and f/u of chronic health problems   I have personally reviewed the Medicare Annual Wellness questionnaire and have noted 1. The patient's medical and social history 2. Their use of alcohol, tobacco or illicit drugs 3. Their current medications and supplements 4. The patient's functional ability including ADL's, fall risks, home safety risks and hearing or visual             impairment. 5. Diet and physical activities 6. Evidence for depression or mood disorders  The patients weight, height, BMI have been recorded in the chart and visual acuity is per eye clinic.  I have made referrals, counseling and provided education to the patient based review of the above and I have provided the pt with a written personalized care plan for preventive services. Reviewed and updated provider list, see scanned forms.  See scanned forms.  Routine anticipatory guidance given to patient.  See health maintenance. Colon cancer screening- out aged colonoscopy  Flu vaccine -gets in the fall  covid-immunizations done  Tetanus vaccine -def/ins cov Pneumovax- completed Zoster vaccine-not interested  Falls-none  Fractures-none  Supplements-preservision  Exercise -goes walking each day/ also some garden and yard work   Psychologist, occupational but not living will -given materials  Cognitive function addressed- see scanned forms- and if abnormal then additional documentation follows.  History of mild memory issues/short term  Still does pretty well  Has family help with finances  Does  not get lost  Does not get confused  Keeps up with media / does not read due to vision   PMH and [place] reviewed  Meds, vitals, and allergies reviewed.   Care team Alea Ryer-pcp Jones,Drew-dermatology   ROS: See HPI.  Otherwise negative.    Weight : Wt Readings from Last 3 Encounters:  05/17/20 207 lb 8 oz (94.1 kg)  05/17/19 215 lb 1 oz (97.6 kg)  05/09/19 218 lb (98.9 kg)  not eating as much as he used to  Age and heat related  Is active  29.15 kg/m   Hearing/vision:  Hearing Screening   125Hz  250Hz  500Hz  1000Hz  2000Hz  3000Hz  4000Hz  6000Hz  8000Hz   Right ear:   0 0 0  0    Left ear:   40 0 0  0    Vision Screening Comments: Pt had eye exam on 05/15/20 at [facility] retinal specialist history of macular degeneration - fairly stable  Cerumen noted in R ear  He does not complain much about hearing  Does not feel ready for hearing aides   HTN bp is stable today  No cp or palpitations or headaches or edema  No side effects to medicines  BP Readings from Last 3 Encounters:  05/17/20 136/86  05/17/19 138/78  03/15/19 (!) 147/91    Due for labs  Pulse Readings from Last 3 Encounters:  05/17/20 60  05/17/19 68  03/15/19 61   Hypothyroidism  Pt has no clinical changes No change in energy level/ hair or skin/ edema and no tremor Lab Results  Component Value Date   TSH 4.87 (H) 05/17/2019  this was slt above ref range-pref not to inc dose Due for labs for re check today   BPH Lab Results  Component Value Date   PSA 0.59 12/18/2017   PSA 0.56 12/17/2016   PSA 0.48 12/13/2015  no longer checking psa at age  A little slow for stream to start -no big problems  Does not keep him from doing anything  Gets up 2 times per night to urinate     lipids/low HDL Prediabetes - Lab Results  Component Value Date   HGBA1C 5.2 05/17/2019   Due for labs   Diet -no changes   Patient Active Problem List   Diagnosis Date Noted  . Cerumen impaction 05/17/2020  . Low back pain  05/21/2018  . Macular degeneration 12/18/2017  . Memory loss, short term 12/14/2015  . Hearing loss of aging 12/09/2013  . Encounter for Medicare annual wellness exam 12/09/2013  . Personal history of colonic polyps 12/08/2012  . Tinea versicolor 09/17/2011  . Prostate cancer screening 09/10/2011  . Hyperkalemia 09/25/2010  . Prediabetes 09/25/2010  . Low HDL (under 40) 11/05/2009  . Hypothyroidism 07/28/2007  . Gout 07/28/2007  . ERECTILE DYSFUNCTION 07/28/2007  . Essential hypertension 07/28/2007  . Benign prostatic hyperplasia 07/28/2007   Past Medical History:  Diagnosis Date  . BPH (benign prostatic hypertrophy)   . Difficulty reading   . Difficulty seeing    Difficulty in seeing certain things and reading/ not legally blind!  . Gout   . History of shingles   . Hypertension   . Hypothyroidism   . Macular degeneration    Past Surgical History:  Procedure Laterality Date  . COLONOSCOPY    . POLYPECTOMY     Social History   Tobacco Use  . Smoking status: Former Smoker    Quit date: 10/13/1968    Years since quitting: 51.6  . Smokeless tobacco: Never Used  Vaping Use  . Vaping Use: Never used  Substance Use Topics  . Alcohol use: No    Comment: no alcohol in 20 years  . Drug use: No   Family History  Problem Relation Age of Onset  . Heart disease Mother        CABG  . Heart disease Father        CAD  . Cancer Father        lung CA  . Cancer Brother        lung CA   No Known Allergies Current Outpatient Medications on File Prior to Visit  Medication Sig Dispense Refill  . levothyroxine (SYNTHROID) 75 MCG tablet TAKE 1 TABLET BY MOUTH ONCE A DAY BEFOREBREAKFAST. 90 tablet 2  . Multiple Vitamins-Minerals (PRESERVISION AREDS 2 PO) Take 1 tablet by mouth daily.     No current facility-administered medications on file prior to visit.     Review of Systems  Constitutional: Negative for activity change, appetite change, fatigue, fever and unexpected weight  change.  HENT: Positive for hearing loss. Negative for congestion, ear discharge, ear pain, rhinorrhea, sore throat and trouble swallowing.   Eyes: Positive for visual disturbance. Negative for pain, redness and itching.  Respiratory: Negative for cough, chest tightness, shortness of breath and wheezing.   Cardiovascular: Negative for chest pain and palpitations.  Gastrointestinal: Negative for abdominal pain, blood in stool, constipation, diarrhea and nausea.  Endocrine: Negative for cold intolerance, heat intolerance, polydipsia and polyuria.  Genitourinary: Negative for difficulty urinating, dysuria, frequency and urgency.  Musculoskeletal: Positive for arthralgias. Negative for  joint swelling and myalgias.  Skin: Negative for pallor and rash.  Neurological: Negative for dizziness, tremors, weakness, numbness and headaches.  Hematological: Negative for adenopathy. Does not bruise/bleed easily.  Psychiatric/Behavioral: Negative for decreased concentration and dysphoric mood. The patient is not nervous/anxious.        Objective:   Physical Exam Constitutional:      General: He is not in acute distress.    Appearance: Normal appearance. He is well-developed. He is not ill-appearing or diaphoretic.     Comments: overwt  HENT:     Head: Normocephalic and atraumatic.     Right Ear: External ear normal. There is impacted cerumen.     Left Ear: External ear normal. There is impacted cerumen.     Nose: Nose normal. No congestion.     Mouth/Throat:     Mouth: Mucous membranes are moist.     Pharynx: Oropharynx is clear. No posterior oropharyngeal erythema.  Eyes:     General: No scleral icterus.       Right eye: No discharge.        Left eye: No discharge.     Conjunctiva/sclera: Conjunctivae normal.     Pupils: Pupils are equal, round, and reactive to light.  Neck:     Thyroid: No thyromegaly.     Vascular: No carotid bruit or JVD.  Cardiovascular:     Rate and Rhythm: Normal rate  and regular rhythm.     Pulses: Normal pulses.     Heart sounds: Normal heart sounds. No gallop.   Pulmonary:     Effort: Pulmonary effort is normal. No respiratory distress.     Breath sounds: Normal breath sounds. No wheezing or rales.     Comments: Good air exch Chest:     Chest wall: No tenderness.  Abdominal:     General: Bowel sounds are normal. There is no distension or abdominal bruit.     Palpations: Abdomen is soft. There is no mass.     Tenderness: There is no abdominal tenderness.     Hernia: No hernia is present.  Musculoskeletal:        General: No tenderness.     Cervical back: Normal range of motion and neck supple. No rigidity. No muscular tenderness.     Right lower leg: No edema.     Left lower leg: No edema.  Lymphadenopathy:     Cervical: No cervical adenopathy.  Skin:    General: Skin is warm and dry.     Coloration: Skin is not pale.     Findings: No erythema or rash.     Comments: Lentigines and keratosis diffusely Solar aging  Neurological:     Mental Status: He is alert.     Cranial Nerves: No cranial nerve deficit.     Motor: No abnormal muscle tone.     Coordination: Coordination normal.     Gait: Gait normal.     Deep Tendon Reflexes: Reflexes are normal and symmetric. Reflexes normal.  Psychiatric:        Mood and Affect: Mood normal.        Cognition and Memory: Cognition and memory normal.     Comments: Mentally sharp today           Assessment & Plan:   Problem List Items Addressed This Visit      Cardiovascular and Mediastinum   Essential hypertension    bp in fair control at this time  BP Readings from Last 1 Encounters:  05/17/20 136/86   No changes needed Most recent labs reviewed  Disc lifstyle change with low sodium diet and exercise        Relevant Medications   lisinopril (ZESTRIL) 40 MG tablet   Other Relevant Orders   CBC with Differential/Platelet (Completed)   Comprehensive metabolic panel (Completed)    Lipid panel (Completed)   TSH (Completed)     Endocrine   Hypothyroidism    No clinical changes TSH today and then will change dose if needed       Relevant Orders   TSH (Completed)     Nervous and Auditory   Cerumen impaction    Bilateral and worse on R Worsening his already poor hearing  Adv use of debrox three times per week as directed and then f/u1-2 mo for irrigation        Genitourinary   Benign prostatic hyperplasia    No clinical changes in voiding Does not desire tx         Other   Low HDL (under 40)    Labs today  Disc goals for lipids and reasons to control them Rev last labs with pt Rev low sat fat diet in detail  Again recommended omega 3 supplement and exercise      Relevant Orders   Lipid panel (Completed)   Gout    No flares on allopurinol  Uric acid checked today      Relevant Orders   Uric acid (Completed)   Prediabetes    A1C with labs today  Some wt loss noted disc imp of low glycemic diet and wt loss to prevent DM2       Relevant Orders   Hemoglobin A1c (Completed)   Encounter for Medicare annual wellness exam - Primary    Reviewed health habits including diet and exercise and skin cancer prevention Reviewed appropriate screening tests for age  Also reviewed health mt list, fam hx and immunization status , as well as social and family history   See HPI Labs reviewed  Pt declines zoster vaccine Will get flu shot in the fall  He is covid immunized  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

## 2020-05-17 NOTE — Assessment & Plan Note (Signed)
Has regular opthy visits  No changes

## 2020-05-18 ENCOUNTER — Encounter: Payer: Self-pay | Admitting: *Deleted

## 2020-05-21 DIAGNOSIS — L570 Actinic keratosis: Secondary | ICD-10-CM | POA: Diagnosis not present

## 2020-05-21 DIAGNOSIS — L812 Freckles: Secondary | ICD-10-CM | POA: Diagnosis not present

## 2020-05-21 DIAGNOSIS — Z85828 Personal history of other malignant neoplasm of skin: Secondary | ICD-10-CM | POA: Diagnosis not present

## 2020-05-21 DIAGNOSIS — L821 Other seborrheic keratosis: Secondary | ICD-10-CM | POA: Diagnosis not present

## 2020-05-21 DIAGNOSIS — L218 Other seborrheic dermatitis: Secondary | ICD-10-CM | POA: Diagnosis not present

## 2020-06-26 ENCOUNTER — Other Ambulatory Visit: Payer: Self-pay | Admitting: Family Medicine

## 2020-07-19 ENCOUNTER — Other Ambulatory Visit: Payer: Self-pay

## 2020-07-19 ENCOUNTER — Ambulatory Visit (INDEPENDENT_AMBULATORY_CARE_PROVIDER_SITE_OTHER): Payer: Medicare Other

## 2020-07-19 DIAGNOSIS — Z23 Encounter for immunization: Secondary | ICD-10-CM | POA: Diagnosis not present

## 2020-09-24 ENCOUNTER — Encounter: Payer: Self-pay | Admitting: Family Medicine

## 2020-09-24 DIAGNOSIS — L570 Actinic keratosis: Secondary | ICD-10-CM | POA: Diagnosis not present

## 2020-09-24 DIAGNOSIS — C44622 Squamous cell carcinoma of skin of right upper limb, including shoulder: Secondary | ICD-10-CM | POA: Diagnosis not present

## 2020-09-24 DIAGNOSIS — Z85828 Personal history of other malignant neoplasm of skin: Secondary | ICD-10-CM | POA: Diagnosis not present

## 2020-09-24 DIAGNOSIS — L110 Acquired keratosis follicularis: Secondary | ICD-10-CM | POA: Diagnosis not present

## 2020-09-24 DIAGNOSIS — D485 Neoplasm of uncertain behavior of skin: Secondary | ICD-10-CM | POA: Diagnosis not present

## 2020-11-13 DIAGNOSIS — H31092 Other chorioretinal scars, left eye: Secondary | ICD-10-CM | POA: Diagnosis not present

## 2020-11-13 DIAGNOSIS — H35433 Paving stone degeneration of retina, bilateral: Secondary | ICD-10-CM | POA: Diagnosis not present

## 2020-11-13 DIAGNOSIS — H35423 Microcystoid degeneration of retina, bilateral: Secondary | ICD-10-CM | POA: Diagnosis not present

## 2020-11-13 DIAGNOSIS — H353134 Nonexudative age-related macular degeneration, bilateral, advanced atrophic with subfoveal involvement: Secondary | ICD-10-CM | POA: Diagnosis not present

## 2020-12-27 ENCOUNTER — Other Ambulatory Visit: Payer: Self-pay | Admitting: Family Medicine

## 2021-03-27 ENCOUNTER — Other Ambulatory Visit: Payer: Self-pay | Admitting: Family Medicine

## 2021-04-23 ENCOUNTER — Telehealth: Payer: Self-pay | Admitting: Family Medicine

## 2021-04-23 NOTE — Telephone Encounter (Signed)
Left message with patients wife, to have pt rtn my call to r/s appt with nha on 05/17/21

## 2021-05-14 DIAGNOSIS — H35433 Paving stone degeneration of retina, bilateral: Secondary | ICD-10-CM | POA: Diagnosis not present

## 2021-05-14 DIAGNOSIS — H31092 Other chorioretinal scars, left eye: Secondary | ICD-10-CM | POA: Diagnosis not present

## 2021-05-14 DIAGNOSIS — H35423 Microcystoid degeneration of retina, bilateral: Secondary | ICD-10-CM | POA: Diagnosis not present

## 2021-05-14 DIAGNOSIS — H353134 Nonexudative age-related macular degeneration, bilateral, advanced atrophic with subfoveal involvement: Secondary | ICD-10-CM | POA: Diagnosis not present

## 2021-05-19 ENCOUNTER — Telehealth: Payer: Self-pay | Admitting: Family Medicine

## 2021-05-19 DIAGNOSIS — E786 Lipoprotein deficiency: Secondary | ICD-10-CM

## 2021-05-19 DIAGNOSIS — N400 Enlarged prostate without lower urinary tract symptoms: Secondary | ICD-10-CM

## 2021-05-19 DIAGNOSIS — I10 Essential (primary) hypertension: Secondary | ICD-10-CM

## 2021-05-19 DIAGNOSIS — E039 Hypothyroidism, unspecified: Secondary | ICD-10-CM

## 2021-05-19 DIAGNOSIS — M1A9XX0 Chronic gout, unspecified, without tophus (tophi): Secondary | ICD-10-CM

## 2021-05-19 DIAGNOSIS — R7303 Prediabetes: Secondary | ICD-10-CM

## 2021-05-19 NOTE — Telephone Encounter (Signed)
-----   Message from Cloyd Stagers, RT sent at 05/06/2021  9:38 AM EDT ----- Regarding: Lab Orders for Monday 8.8.2022 Please place lab orders for Monday 8.8.2022, office visit for physical on Thursday 8.11.2022 Thank you, Dyke Maes RT(R)

## 2021-05-20 ENCOUNTER — Other Ambulatory Visit: Payer: Medicare Other

## 2021-05-20 ENCOUNTER — Ambulatory Visit: Payer: Medicare Other

## 2021-05-23 ENCOUNTER — Other Ambulatory Visit: Payer: Self-pay

## 2021-05-23 ENCOUNTER — Ambulatory Visit (INDEPENDENT_AMBULATORY_CARE_PROVIDER_SITE_OTHER): Payer: Medicare Other | Admitting: Family Medicine

## 2021-05-23 ENCOUNTER — Encounter: Payer: Self-pay | Admitting: Family Medicine

## 2021-05-23 ENCOUNTER — Telehealth: Payer: Self-pay | Admitting: Family Medicine

## 2021-05-23 VITALS — BP 136/80 | HR 61 | Temp 98.2°F | Ht 71.0 in | Wt 212.4 lb

## 2021-05-23 DIAGNOSIS — H353130 Nonexudative age-related macular degeneration, bilateral, stage unspecified: Secondary | ICD-10-CM

## 2021-05-23 DIAGNOSIS — Z0000 Encounter for general adult medical examination without abnormal findings: Secondary | ICD-10-CM | POA: Diagnosis not present

## 2021-05-23 DIAGNOSIS — N400 Enlarged prostate without lower urinary tract symptoms: Secondary | ICD-10-CM | POA: Diagnosis not present

## 2021-05-23 DIAGNOSIS — H9113 Presbycusis, bilateral: Secondary | ICD-10-CM

## 2021-05-23 DIAGNOSIS — M1A9XX0 Chronic gout, unspecified, without tophus (tophi): Secondary | ICD-10-CM | POA: Diagnosis not present

## 2021-05-23 DIAGNOSIS — R7303 Prediabetes: Secondary | ICD-10-CM | POA: Diagnosis not present

## 2021-05-23 DIAGNOSIS — I10 Essential (primary) hypertension: Secondary | ICD-10-CM | POA: Diagnosis not present

## 2021-05-23 DIAGNOSIS — E786 Lipoprotein deficiency: Secondary | ICD-10-CM

## 2021-05-23 DIAGNOSIS — E039 Hypothyroidism, unspecified: Secondary | ICD-10-CM

## 2021-05-23 LAB — COMPREHENSIVE METABOLIC PANEL
ALT: 10 U/L (ref 0–53)
AST: 19 U/L (ref 0–37)
Albumin: 4.3 g/dL (ref 3.5–5.2)
Alkaline Phosphatase: 105 U/L (ref 39–117)
BUN: 11 mg/dL (ref 6–23)
CO2: 30 mEq/L (ref 19–32)
Calcium: 9.8 mg/dL (ref 8.4–10.5)
Chloride: 102 mEq/L (ref 96–112)
Creatinine, Ser: 1.02 mg/dL (ref 0.40–1.50)
GFR: 67.41 mL/min (ref >60.00)
Glucose, Bld: 82 mg/dL (ref 70–99)
Potassium: 4.6 mEq/L (ref 3.5–5.1)
Sodium: 136 mEq/L (ref 135–145)
Total Bilirubin: 0.8 mg/dL (ref 0.2–1.2)
Total Protein: 6.7 g/dL (ref 6.0–8.3)

## 2021-05-23 LAB — CBC WITH DIFFERENTIAL/PLATELET
Basophils Absolute: 0.1 10*3/uL (ref 0.0–0.1)
Basophils Relative: 1.2 % (ref 0.0–3.0)
Eosinophils Absolute: 0.3 10*3/uL (ref 0.0–0.7)
Eosinophils Relative: 6 % — ABNORMAL HIGH (ref 0.0–5.0)
HCT: 41.3 % (ref 39.0–52.0)
Hemoglobin: 13.6 g/dL (ref 13.0–17.0)
Lymphocytes Relative: 37.3 % (ref 12.0–46.0)
Lymphs Abs: 1.9 10*3/uL (ref 0.7–4.0)
MCHC: 32.9 g/dL (ref 30.0–36.0)
MCV: 100.1 fl — ABNORMAL HIGH (ref 78.0–100.0)
Monocytes Absolute: 0.4 10*3/uL (ref 0.1–1.0)
Monocytes Relative: 7.2 % (ref 3.0–12.0)
Neutro Abs: 2.4 10*3/uL (ref 1.4–7.7)
Neutrophils Relative %: 48.3 % (ref 43.0–77.0)
Platelets: 165 10*3/uL (ref 150.0–400.0)
RBC: 4.12 Mil/uL — ABNORMAL LOW (ref 4.22–5.81)
RDW: 14.5 % (ref 11.5–15.5)
WBC: 5.1 10*3/uL (ref 4.0–10.5)

## 2021-05-23 LAB — LIPID PANEL
Cholesterol: 116 mg/dL (ref 0–200)
HDL: 29.7 mg/dL — ABNORMAL LOW (ref >39.00)
LDL Cholesterol: 58 mg/dL (ref 0–99)
NonHDL: 86.2
Total CHOL/HDL Ratio: 4
Triglycerides: 139 mg/dL (ref 0.0–149.0)
VLDL: 27.8 mg/dL (ref 0.0–40.0)

## 2021-05-23 LAB — TSH: TSH: 5.52 u[IU]/mL — ABNORMAL HIGH (ref 0.35–5.50)

## 2021-05-23 LAB — URIC ACID: Uric Acid, Serum: 4.5 mg/dL (ref 4.0–7.8)

## 2021-05-23 LAB — HEMOGLOBIN A1C: Hgb A1c MFr Bld: 5.3 % (ref 4.6–6.5)

## 2021-05-23 MED ORDER — LEVOTHYROXINE SODIUM 88 MCG PO TABS: 88.0000 ug | ORAL_TABLET | Freq: Every day | ORAL | 3 refills | Status: DC

## 2021-05-23 MED ORDER — LISINOPRIL 40 MG PO TABS: 40.0000 mg | ORAL_TABLET | Freq: Every day | ORAL | 3 refills | Status: DC

## 2021-05-23 MED ORDER — LEVOTHYROXINE SODIUM 75 MCG PO TABS: ORAL_TABLET | ORAL | 3 refills | Status: DC

## 2021-05-23 MED ORDER — ALLOPURINOL 100 MG PO TABS: 100.0000 mg | ORAL_TABLET | Freq: Every day | ORAL | 3 refills | Status: DC

## 2021-05-23 NOTE — Assessment & Plan Note (Signed)
No significant changes per pt  Has not wanted treatment

## 2021-05-23 NOTE — Assessment & Plan Note (Signed)
A1C today  disc imp of low glycemic diet and wt loss to prevent DM2  Per pt no change in diet or activity

## 2021-05-23 NOTE — Assessment & Plan Note (Signed)
Pt declines further eval or treatment

## 2021-05-23 NOTE — Assessment & Plan Note (Signed)
Disc goals for lipids and reasons to control them Rev last labs with pt Rev low sat fat diet in detail Labs ordered today  Enc exercise as tolerated for HDL in addn to diet

## 2021-05-23 NOTE — Assessment & Plan Note (Signed)
Continues allopurinol 100 mg daily  No flares Uric acid ordered today

## 2021-05-23 NOTE — Telephone Encounter (Signed)
-----   Message from Tammi Sou, [place] sent at 05/23/2021  4:59 PM EDT ----- Pt notified of lab results and Dr. Marliss Coots comments. Pt said he hasn't missed any doses of med and he also hasn't picked up Rx so he will wait until PCP send in new dose.

## 2021-05-23 NOTE — Assessment & Plan Note (Signed)
This continues to worsen Sees oph but no current treatment Cannot drive Able to do ASLs and household tasks

## 2021-05-23 NOTE — Progress Notes (Signed)
Subjective:    Patient ID: Jamie Byrd, male    DOB: September 01, 1934, 87 y.o.   MRN: [id number]   This visit occurred during the SARS-CoV-2 public health emergency.  Safety protocols were in place, including screening questions prior to the visit, additional usage of staff PPE, and extensive cleaning of exam room while observing appropriate contact time as indicated for disinfecting solutions.   HPI Pt presents for annual f/u of chronic medical problems  Due to vision problems cannot fill out the amw paperwork  The patients weight, height, BMI have been recorded in the chart and visual acuity is per eye clinic.  I have made referrals, counseling and provided education to the patient based review of the above and I have provided the pt with a written personalized care plan for preventive services. Reviewed and updated provider list, see scanned forms.  See scanned forms.  Routine anticipatory guidance given to patient.  See health maintenance. Colon cancer screening-colonoscopy 2014 no recall recommended  Flu vaccine-fall Tetanus vaccine - def/inx  Covid vaccinated Pneumovax up to date  Zoster vaccine-declines /still declines   Falls- none  Fractures- none  Supplements-takes eye vitamin  Exercise -walking /tries to do   Diet is balanced -eats a little of everything  Prostate cancer screening-out aged  Has BPH-sluggish start in am/ no change and not problematic  Lab Results  Component Value Date   PSA 0.59 12/18/2017   PSA 0.56 12/17/2016   PSA 0.48 12/13/2015    No further psa due to age   Advance directive-has power of attorney (sister) but not living will  Given materials to work on it with his sister   Cognitive function addressed- see scanned forms- and if abnormal then additional documentation follows.  Memory is fine  Seldom misplaces things Occ word recall- a little slower  Keeps up with current events, listens to TV mostly  Still works outside and raises a big  garden  No confusion and has not become lost    PMH and SH reviewed  Meds, vitals, and allergies reviewed.   ROS: See HPI.  Otherwise negative.    Weight : Wt Readings from Last 3 Encounters:  05/23/21 212 lb 7 oz (96.4 kg)  05/17/20 207 lb 8 oz (94.1 kg)  05/17/19 215 lb 1 oz (97.6 kg)   29.63 kg/m Less appetite in the heat    Hearing/vision: Hearing Screening   '500Hz'$  '1000Hz'$  '2000Hz'$  '4000Hz'$   Right ear 40 0 0 0  Left ear 40 0 0 0  Vision Screening - Comments:: Pt had eye exam on 05/11/21 at [facility] retinal specialist  Has struggled with eyesight  No driving in 2 years- that is tough   Hearing is about the same, gets by fine w/o hearing aides Declines further eval  Sometimes has wax   Otherwise feels tine   Care team : Venna Berberich-pcp Ronnald Ramp- derm Gershon Crane -oph   HTN bp is stable today  No cp or palpitations or headaches or edema  No side effects to medicines  BP Readings from Last 3 Encounters:  05/23/21 140/78  05/17/20 136/86  05/17/19 138/78    Lisinopril 40 mg daily   Pulse Readings from Last 3 Encounters:  05/23/21 61  05/17/20 60  05/17/19 68     Lab Results  Component Value Date   CREATININE 0.90 05/17/2020   BUN 14 05/17/2020   NA 134 (L) 05/17/2020   K 4.9 05/17/2020   CL 101 05/17/2020   CO2 31  05/17/2020   Due for labs   Hypothyroidism  Pt has no clinical changes No change in energy level/ hair or skin/ edema and no tremor Lab Results  Component Value Date   TSH 4.16 05/17/2020     Levothyroxine 75 mcg daily    Gout Allopurinol 100 mg daily  Lab Results  Component Value Date   LABURIC 4.6 05/17/2020    Due for labs for cholesterol and prediabetes  Patient Active Problem List   Diagnosis Date Noted   Low back pain 05/21/2018   Macular degeneration 12/18/2017   Hearing loss of aging 12/09/2013   Encounter for Medicare annual wellness exam 12/09/2013   Personal history of colonic polyps 12/08/2012   Prostate cancer screening  09/10/2011   Hyperkalemia 09/25/2010   Prediabetes 09/25/2010   Low HDL (under 40) 11/05/2009   Hypothyroidism 07/28/2007   Gout 07/28/2007   ERECTILE DYSFUNCTION 07/28/2007   Essential hypertension 07/28/2007   Benign prostatic hyperplasia 07/28/2007   Past Medical History:  Diagnosis Date   BPH (benign prostatic hypertrophy)    Difficulty reading    Difficulty seeing    Difficulty in seeing certain things and reading/ not legally blind!   Gout    History of shingles    Hypertension    Hypothyroidism    Macular degeneration    Past Surgical History:  Procedure Laterality Date   COLONOSCOPY     POLYPECTOMY     Social History   Tobacco Use   Smoking status: Former    Types: Cigarettes    Quit date: 10/13/1968    Years since quitting: 52.6   Smokeless tobacco: Never  Vaping Use   Vaping Use: Never used  Substance Use Topics   Alcohol use: No    Comment: no alcohol in 20 years   Drug use: No   Family History  Problem Relation Age of Onset   Heart disease Mother        CABG   Heart disease Father        CAD   Cancer Father        lung CA   Cancer Brother        lung CA   No Known Allergies Current Outpatient Medications on File Prior to Visit  Medication Sig Dispense Refill   Multiple Vitamins-Minerals (PRESERVISION AREDS 2 PO) Take 1 tablet by mouth daily.     No current facility-administered medications on file prior to visit.    Review of Systems  Constitutional:  Negative for activity change, appetite change, fatigue, fever and unexpected weight change.  HENT:  Positive for hearing loss. Negative for congestion, rhinorrhea, sore throat and trouble swallowing.   Eyes:  Positive for visual disturbance. Negative for pain, redness and itching.  Respiratory:  Negative for cough, chest tightness, shortness of breath and wheezing.   Cardiovascular:  Negative for chest pain and palpitations.  Gastrointestinal:  Negative for abdominal pain, blood in stool,  constipation, diarrhea and nausea.  Endocrine: Negative for cold intolerance, heat intolerance, polydipsia and polyuria.  Genitourinary:  Negative for difficulty urinating, dysuria, frequency and urgency.  Musculoskeletal:  Positive for arthralgias. Negative for joint swelling and myalgias.  Skin:  Negative for pallor and rash.  Neurological:  Negative for dizziness, tremors, weakness, numbness and headaches.  Hematological:  Negative for adenopathy. Does not bruise/bleed easily.  Psychiatric/Behavioral:  Negative for decreased concentration and dysphoric mood. The patient is not nervous/anxious.       Objective:   Physical Exam Constitutional:  General: He is not in acute distress.    Appearance: Normal appearance. He is well-developed. He is not ill-appearing or diaphoretic.     Comments: overwt  HENT:     Head: Normocephalic and atraumatic.     Right Ear: Tympanic membrane, ear canal and external ear normal. There is no impacted cerumen.     Left Ear: Tympanic membrane, ear canal and external ear normal. There is no impacted cerumen.     Ears:     Comments: Hearing loss noted    Nose: Nose normal. No congestion.     Mouth/Throat:     Mouth: Mucous membranes are moist.     Pharynx: Oropharynx is clear. No posterior oropharyngeal erythema.  Eyes:     General: No scleral icterus.       Right eye: No discharge.        Left eye: No discharge.     Conjunctiva/sclera: Conjunctivae normal.     Pupils: Pupils are equal, round, and reactive to light.  Neck:     Thyroid: No thyromegaly.     Vascular: No carotid bruit or JVD.  Cardiovascular:     Rate and Rhythm: Normal rate and regular rhythm.     Pulses: Normal pulses.     Heart sounds: Normal heart sounds.    No gallop.  Pulmonary:     Effort: Pulmonary effort is normal. No respiratory distress.     Breath sounds: Normal breath sounds. No wheezing or rales.     Comments: Good air exch Chest:     Chest wall: No tenderness.   Abdominal:     General: Bowel sounds are normal. There is no distension or abdominal bruit.     Palpations: Abdomen is soft. There is no mass.     Tenderness: There is no abdominal tenderness.     Hernia: No hernia is present.  Musculoskeletal:        General: No tenderness.     Cervical back: Normal range of motion and neck supple. No rigidity. No muscular tenderness.     Right lower leg: No edema.     Left lower leg: No edema.     Comments: No kyphosis  Lymphadenopathy:     Cervical: No cervical adenopathy.  Skin:    General: Skin is warm and dry.     Coloration: Skin is not pale.     Findings: No erythema or rash.  Neurological:     Mental Status: He is alert.     Cranial Nerves: No cranial nerve deficit.     Motor: No abnormal muscle tone.     Coordination: Coordination normal.     Gait: Gait normal.     Deep Tendon Reflexes: Reflexes are normal and symmetric. Reflexes normal.  Psychiatric:        Attention and Perception: Attention normal.        Mood and Affect: Mood normal.        Cognition and Memory: Cognition and memory normal.     Comments: Mentally sharp today          Assessment & Plan:   Problem List Items Addressed This Visit       Cardiovascular and Mediastinum   Essential hypertension - Primary    bp in fair control at this time  BP Readings from Last 1 Encounters:  05/23/21 136/80  No changes needed Most recent labs reviewed  Disc lifstyle change with low sodium diet and exercise  Plan to continue lisinopril  40 mg daily  Labs ordered      Relevant Medications   lisinopril (ZESTRIL) 40 MG tablet     Endocrine   Hypothyroidism    Hypothyroidism  Pt has no clinical changes No change in energy level/ hair or skin/ edema and no tremor TSH ordered Currently taking levothyroxine 75 mcg daily        Relevant Medications   levothyroxine (SYNTHROID) 75 MCG tablet     Nervous and Auditory   Hearing loss of aging    Pt declines further  eval or treatment        Genitourinary   Benign prostatic hyperplasia    No significant changes per pt  Has not wanted treatment        Other   Low HDL (under 40)    Disc goals for lipids and reasons to control them Rev last labs with pt Rev low sat fat diet in detail Labs ordered today  Enc exercise as tolerated for HDL in addn to diet       Gout    Continues allopurinol 100 mg daily  No flares Uric acid ordered today      Prediabetes    A1C today  disc imp of low glycemic diet and wt loss to prevent DM2  Per pt no change in diet or activity      Macular degeneration    This continues to worsen Sees oph but no current treatment Cannot drive Able to do ASLs and household tasks

## 2021-05-23 NOTE — Assessment & Plan Note (Signed)
Hypothyroidism  Pt has no clinical changes No change in energy level/ hair or skin/ edema and no tremor TSH ordered Currently taking levothyroxine 75 mcg daily

## 2021-05-23 NOTE — Assessment & Plan Note (Signed)
bp in fair control at this time  BP Readings from Last 1 Encounters:  05/23/21 136/80   No changes needed Most recent labs reviewed  Disc lifstyle change with low sodium diet and exercise  Plan to continue lisinopril 40 mg daily  Labs ordered

## 2021-05-23 NOTE — Patient Instructions (Addendum)
Get your flu shot in the fall   Get vitamin D for bone health and take 1000 iu daily   Work on the advance directive (blue packet) with your sister and get it notarized and get [place] a copy    At [employer] you can check out recorded books since vision is bad   Call us and let us know what practice you want to see for dermatology in [place] (since it is harder for you to get to [facility])  Then I will do a referral

## 2021-05-27 ENCOUNTER — Telehealth: Payer: Self-pay | Admitting: *Deleted

## 2021-05-27 NOTE — Telephone Encounter (Signed)
Left message with wife to call the office back,  ** if pt calls back please schedule lab appt in 6 weeks (non fasting)

## 2021-05-27 NOTE — Telephone Encounter (Signed)
-----   Message from Abner Greenspan, MD sent at 05/23/2021  5:02 PM EDT ----- I sent it to his pharmacy- 88 mcg Re check about 6 wk please

## 2021-05-27 NOTE — Telephone Encounter (Signed)
Pt called back, labs have been scheduled

## 2021-07-08 ENCOUNTER — Telehealth: Payer: Self-pay | Admitting: Family Medicine

## 2021-07-08 DIAGNOSIS — E039 Hypothyroidism, unspecified: Secondary | ICD-10-CM

## 2021-07-08 NOTE — Telephone Encounter (Signed)
-----   Message from Ellamae Sia sent at 06/26/2021 10:43 AM EDT ----- Regarding: Lab orders for Tuesday, 9.27.22 Lab orders, no f/u

## 2021-07-09 ENCOUNTER — Other Ambulatory Visit: Payer: Medicare Other

## 2021-09-12 ENCOUNTER — Ambulatory Visit (INDEPENDENT_AMBULATORY_CARE_PROVIDER_SITE_OTHER): Payer: Medicare Other | Admitting: Dermatology

## 2021-09-12 ENCOUNTER — Other Ambulatory Visit: Payer: Self-pay

## 2021-09-12 DIAGNOSIS — C4442 Squamous cell carcinoma of skin of scalp and neck: Secondary | ICD-10-CM

## 2021-09-12 DIAGNOSIS — C4491 Basal cell carcinoma of skin, unspecified: Secondary | ICD-10-CM

## 2021-09-12 DIAGNOSIS — Z85828 Personal history of other malignant neoplasm of skin: Secondary | ICD-10-CM | POA: Diagnosis not present

## 2021-09-12 DIAGNOSIS — L570 Actinic keratosis: Secondary | ICD-10-CM

## 2021-09-12 DIAGNOSIS — C4441 Basal cell carcinoma of skin of scalp and neck: Secondary | ICD-10-CM | POA: Diagnosis not present

## 2021-09-12 DIAGNOSIS — D485 Neoplasm of uncertain behavior of skin: Secondary | ICD-10-CM

## 2021-09-12 DIAGNOSIS — L578 Other skin changes due to chronic exposure to nonionizing radiation: Secondary | ICD-10-CM

## 2021-09-12 DIAGNOSIS — C44319 Basal cell carcinoma of skin of other parts of face: Secondary | ICD-10-CM

## 2021-09-12 HISTORY — DX: Basal cell carcinoma of skin, unspecified: C44.91

## 2021-09-12 HISTORY — DX: Squamous cell carcinoma of skin of scalp and neck: C44.42

## 2021-09-12 MED ORDER — MUPIROCIN 2 % EX OINT: 1.0000 "application " | TOPICAL_OINTMENT | Freq: Every day | CUTANEOUS | 0 refills | Status: DC

## 2021-09-12 NOTE — Patient Instructions (Addendum)
Cryotherapy Aftercare  Wash gently with soap and water everyday.   Apply Vaseline and Band-Aid daily until healed.   Prior to procedure, discussed risks of blister formation, small wound, skin dyspigmentation, or rare scar following cryotherapy. Recommend Vaseline ointment to treated areas while healing.   Wound Care Instructions  Cleanse wound gently with soap and water once a day then pat dry with clean gauze. Apply a thing coat of Petrolatum (petroleum jelly, "Vaseline") over the wound (unless you have an allergy to this). We recommend that you use a new, sterile tube of Vaseline. Do not pick or remove scabs. Do not remove the yellow or white "healing tissue" from the base of the wound.  Cover the wound with fresh, clean, nonstick gauze and secure with paper tape. You may use Band-Aids in place of gauze and tape if the would is small enough, but would recommend trimming much of the tape off as there is often too much. Sometimes Band-Aids can irritate the skin.  You should call the office for your biopsy report after 1 week if you have not already been contacted.  If you experience any problems, such as abnormal amounts of bleeding, swelling, significant bruising, significant pain, or evidence of infection, please call the office immediately.  FOR ADULT SURGERY PATIENTS: If you need something for pain relief you may take 1 extra strength Tylenol (acetaminophen) AND 2 Ibuprofen (200mg  each) together every 4 hours as needed for pain. (do not take these if you are allergic to them or if you have a reason you should not take them.) Typically, you may only need pain medication for 1 to 3 days.   If You Need Anything After Your Visit  If you have any questions or concerns for your doctor, please call our main line at [phone] and press option 4 to reach your doctor's medical assistant. If no one answers, please leave a voicemail as directed and we will return your call as soon as possible.  Messages left after 4 pm will be answered the following business day.   You may also send [place] a message via [place]. We typically respond to MyChart messages within 1-2 business days.  For prescription refills, please ask your pharmacy to contact our office. Our fax number is [phone].  If you have an urgent issue when the clinic is closed that cannot wait until the next business day, you can page your doctor at the number below.    Please note that while we do our best to be available for urgent issues outside of office hours, we are not available 24/7.   If you have an urgent issue and are unable to reach [place], you may choose to seek medical care at your doctor's office, retail clinic, urgent care center, or emergency room.  If you have a medical emergency, please immediately call 911 or go to the emergency department.  Pager Numbers  - Dr. Nehemiah Massed: [phone]  - Dr. Laurence Ferrari: [phone]  - Dr. Nicole Kindred: [phone]  In the event of inclement weather, please call our main line at [phone] for an update on the status of any delays or closures.  Dermatology Medication Tips: Please keep the boxes that topical medications come in in order to help keep track of the instructions about where and how to use these. Pharmacies typically print the medication instructions only on the boxes and not directly on the medication tubes.   If your medication is too expensive, please contact our office at [phone] option 4  or send [place] a message through [place].   We are unable to tell what your co-pay for medications will be in advance as this is different depending on your insurance coverage. However, we may be able to find a substitute medication at lower cost or fill out paperwork to get insurance to cover a needed medication.   If a prior authorization is required to get your medication covered by your insurance company, please allow [place] 1-2 business days to complete this process.  Drug  prices often vary depending on where the prescription is filled and some pharmacies may offer cheaper prices.  The website www.goodrx.com contains coupons for medications through different pharmacies. The prices here do not account for what the cost may be with help from insurance (it may be cheaper with your insurance), but the website can give you the price if you did not use any insurance.  - You can print the associated coupon and take it with your prescription to the pharmacy.  - You may also stop by our office during regular business hours and pick up a GoodRx coupon card.  - If you need your prescription sent electronically to a different pharmacy, notify our office through [facility] or by phone at [phone] option 4.     Si Usted Necesita Algo Despus de Su Visita  Tambin puede enviarnos un mensaje a travs de Pharmacist, community. Por lo general respondemos a los mensajes de MyChart en el transcurso de 1 a 2 das hbiles.  Para renovar recetas, por favor pida a su farmacia que se ponga en contacto con nuestra oficina. Harland Dingwall de fax es [place] [phone].  Si tiene un asunto urgente cuando la clnica est cerrada y que no puede esperar hasta el siguiente da hbil, puede llamar/localizar a su doctor(a) al nmero que aparece a continuacin.   Por favor, tenga en cuenta que aunque hacemos todo lo posible para estar disponibles para asuntos urgentes fuera del horario de [place], no estamos disponibles las 24 horas del da, los 7 das de la [place].   Si tiene un problema urgente y no puede comunicarse con nosotros, puede optar por buscar atencin mdica  en el consultorio de su doctor(a), en una clnica privada, en un centro de atencin urgente o en una sala de emergencias.  Si tiene Engineering geologist, por favor llame inmediatamente al 911 o vaya a la sala de emergencias.  Nmeros de bper  - Dr. Nehemiah Massed: [phone]  - Dra. Moye: [phone]  - Dra. Nicole Kindred:  [phone]  En caso de inclemencias del [place], por favor llame a Johnsie Kindred principal al [phone] para una actualizacin sobre el [place] de cualquier retraso o cierre.  Consejos para la medicacin en dermatologa: Por favor, guarde las cajas en las que vienen los medicamentos de uso tpico para ayudarle a seguir las instrucciones sobre dnde y cmo usarlos. Las farmacias generalmente imprimen las instrucciones del medicamento slo en las cajas y no directamente en los tubos del [place].   Si su medicamento es muy caro, por favor, pngase en contacto con Zigmund Daniel llamando al [phone] y presione la opcin 4 o envenos un mensaje a travs de Pharmacist, community.   No podemos decirle cul ser su copago por los medicamentos por adelantado ya que esto es diferente dependiendo de la cobertura de su seguro. Sin embargo, es posible que podamos encontrar un medicamento sustituto a Electrical engineer un formulario para que el seguro cubra el medicamento que se considera necesario.  Si se requiere una autorizacin previa para que su compaa de seguros [place] su medicamento, por favor permtanos de 1 a 2 das hbiles para completar este proceso.  Los precios de los medicamentos varan con frecuencia dependiendo del Environmental consultant de dnde se surte la receta y alguna farmacias pueden ofrecer precios ms baratos.  El sitio web www.goodrx.com tiene cupones para medicamentos de Airline pilot. Los precios aqu no tienen en cuenta lo que podra costar con la ayuda del seguro (puede ser ms barato con su seguro), pero el sitio web puede darle el precio si no utiliz Research scientist (physical sciences).  - Puede imprimir el cupn correspondiente y llevarlo con su receta a la farmacia.  - Tambin puede pasar por nuestra oficina durante el horario de atencin regular y Charity fundraiser una tarjeta de cupones de GoodRx.  - Si necesita que su receta se enve electrnicamente a una farmacia diferente, informe a nuestra oficina a travs de  MyChart de [place] o por telfono llamando al [phone] y presione la opcin 4.

## 2021-09-12 NOTE — Progress Notes (Signed)
New Patient Visit  Subjective  Jamie Byrd is a 87 y.o. male who presents for the following: Skin Problem (Patient with a spot at neck, present for 4-5 months. Will scab up but will not heal. Patient does have a hx of skin cancer at face and cheeks. ).  The following portions of the chart were reviewed this encounter and updated as appropriate:       Review of Systems:  No other skin or systemic complaints except as noted in HPI or Assessment and Plan.  Objective  Well appearing patient in no apparent distress; mood and affect are within normal limits.  A focused examination was performed including face, neck. Relevant physical exam findings are noted in the Assessment and Plan.  Left elbow x 1, right jaw x 1, right helix x 1, right earlobe x 1, right cheek x 1, right hand x 13, left hand x 16, left helix x 2 (36) Erythematous thin papules/macules with gritty scale.   right neck posterior 0.9 cm pink papule      right neck anterior 0.4 cm pink papule R/o BCC     right temple 0.7 cm pink papule R/o BCC     left temple 1.2 cm pink papule R/o BCC     left zygoma 0.5 cm pink papule R/o BCC      Assessment & Plan  AK (actinic keratosis) (36) Left elbow x 1, right jaw x 1, right helix x 1, right earlobe x 1, right cheek x 1, right hand x 13, left hand x 16, left helix x 2  Prior to procedure, discussed risks of blister formation, small wound, skin dyspigmentation, or rare scar following cryotherapy. Recommend Vaseline ointment to treated areas while healing.  Only treated hypertrophic AKs today  Hypertrophic at left elbow, consider biopsy if not resolved.    Destruction of lesion - Left elbow x 1, right jaw x 1, right helix x 1, right earlobe x 1, right cheek x 1, right hand x 13, left hand x 16, left helix x 2  Destruction method: cryotherapy   Informed consent: discussed and consent obtained   Lesion destroyed using liquid nitrogen: Yes    Cryotherapy cycles:  2 Outcome: patient tolerated procedure well with no complications   Post-procedure details: wound care instructions given    Neoplasm of uncertain behavior of skin (5) right neck posterior  Skin / nail biopsy Type of biopsy: tangential   Informed consent: discussed and consent obtained   Timeout: patient name, date of birth, surgical site, and procedure verified   Patient was prepped and draped in usual sterile fashion: Area prepped with isopropyl alcohol. Anesthesia: the lesion was anesthetized in a standard fashion   Anesthetic:  1% lidocaine w/ epinephrine 1-100,000 buffered w/ 8.4% NaHCO3 Instrument used: flexible razor blade   Hemostasis achieved with: aluminum chloride   Outcome: patient tolerated procedure well   Post-procedure details: wound care instructions given   Additional details:  Mupirocin and a bandage applied  mupirocin ointment (BACTROBAN) 2 % Apply 1 application topically daily. With dressing changes  Specimen 1 - Surgical pathology Differential Diagnosis: r/o BCC  Check Margins: No 0.9 cm pink papule  right neck anterior  Skin / nail biopsy Type of biopsy: tangential   Informed consent: discussed and consent obtained   Timeout: patient name, date of birth, surgical site, and procedure verified   Patient was prepped and draped in usual sterile fashion: Area prepped with isopropyl alcohol. Anesthesia: the lesion  was anesthetized in a standard fashion   Anesthetic:  1% lidocaine w/ epinephrine 1-100,000 buffered w/ 8.4% NaHCO3 Instrument used: flexible razor blade   Hemostasis achieved with: aluminum chloride   Outcome: patient tolerated procedure well   Post-procedure details: wound care instructions given   Additional details:  Mupirocin and a bandage applied  Specimen 2 - Surgical pathology Differential Diagnosis: R/o BCC  Check Margins: No 0.4 cm pink papule   right temple  Skin / nail biopsy Type of biopsy: tangential    Informed consent: discussed and consent obtained   Timeout: patient name, date of birth, surgical site, and procedure verified   Patient was prepped and draped in usual sterile fashion: Area prepped with isopropyl alcohol. Anesthesia: the lesion was anesthetized in a standard fashion   Anesthetic:  1% lidocaine w/ epinephrine 1-100,000 buffered w/ 8.4% NaHCO3 Instrument used: flexible razor blade   Hemostasis achieved with: aluminum chloride   Outcome: patient tolerated procedure well   Post-procedure details: wound care instructions given   Additional details:  Mupirocin and a bandage applied  Specimen 3 - Surgical pathology Differential Diagnosis: R/o BCC  Check Margins: No 0.7 cm pink papule   left temple  Skin / nail biopsy Type of biopsy: tangential   Informed consent: discussed and consent obtained   Timeout: patient name, date of birth, surgical site, and procedure verified   Patient was prepped and draped in usual sterile fashion: Area prepped with isopropyl alcohol. Anesthesia: the lesion was anesthetized in a standard fashion   Anesthetic:  1% lidocaine w/ epinephrine 1-100,000 buffered w/ 8.4% NaHCO3 Instrument used: flexible razor blade   Hemostasis achieved with: aluminum chloride   Outcome: patient tolerated procedure well   Post-procedure details: wound care instructions given   Additional details:  Mupirocin and a bandage applied  Specimen 4 - Surgical pathology Differential Diagnosis: R/o BCC  Check Margins: No 1.2 cm pink papule   left zygoma  Skin / nail biopsy Type of biopsy: tangential   Informed consent: discussed and consent obtained   Timeout: patient name, date of birth, surgical site, and procedure verified   Patient was prepped and draped in usual sterile fashion: Area prepped with isopropyl alcohol. Anesthesia: the lesion was anesthetized in a standard fashion   Anesthetic:  1% lidocaine w/ epinephrine 1-100,000 buffered w/ 8.4%  NaHCO3 Instrument used: flexible razor blade   Hemostasis achieved with: aluminum chloride   Outcome: patient tolerated procedure well   Post-procedure details: wound care instructions given   Additional details:  Mupirocin and a bandage applied  Specimen 5 - Surgical pathology Differential Diagnosis: R/o BCC  Check Margins: No 0.5 cm pink papule 2 pieces   Actinic Damage - Severe, confluent actinic changes with pre-cancerous actinic keratoses  - Severe, chronic, not at goal, secondary to cumulative UV radiation exposure over time - diffuse scaly erythematous macules and papules with underlying dyspigmentation - Discussed Prescription "Field Treatment" for Severe, Chronic Confluent Actinic Changes with Pre-Cancerous Actinic Keratoses Field treatment involves treatment of an entire area of skin that has confluent Actinic Changes (Sun/ Ultraviolet light damage) and PreCancerous Actinic Keratoses by method of PhotoDynamic Therapy (PDT) and/or prescription Topical Chemotherapy agents such as 5-fluorouracil, 5-fluorouracil/calcipotriene, and/or imiquimod.  The purpose is to decrease the number of clinically evident and subclinical PreCancerous lesions to prevent progression to development of skin cancer by chemically destroying early precancer changes that may or may not be visible.  It has been shown to reduce the risk of developing skin  cancer in the treated area. As a result of treatment, redness, scaling, crusting, and open sores may occur during treatment course. One or more than one of these methods may be used and may have to be used several times to control, suppress and eliminate the PreCancerous changes. Discussed treatment course, expected reaction, and possible side effects. - Recommend daily broad spectrum sunscreen SPF 30+ to sun-exposed areas, reapply every 2 hours as needed.  - Staying in the shade or wearing long sleeves, sun glasses (UVA+UVB protection) and wide brim hats (4-inch  brim around the entire circumference of the hat) are also recommended. - Call for new or changing lesions. - Patient deferred field treatment at this time. - Recommend Niacinamide or Nicotinamide 500mg  twice per day to lower risk of non-melanoma skin cancer by approximately 25%. This is usually available at [facility].   Return for TBSE, as scheduled.  Graciella Belton, RMA, am acting as scribe for Forest Gleason, MD .  Documentation: I have reviewed the above documentation for accuracy and completeness, and I agree with the above.  Forest Gleason, MD

## 2021-09-18 ENCOUNTER — Other Ambulatory Visit: Payer: Self-pay

## 2021-09-18 DIAGNOSIS — C44310 Basal cell carcinoma of skin of unspecified parts of face: Secondary | ICD-10-CM

## 2021-09-19 ENCOUNTER — Telehealth: Payer: Self-pay

## 2021-09-19 NOTE — Telephone Encounter (Signed)
-----   Message from Alfonso Patten, MD sent at 09/17/2021 12:42 PM EST ----- 1. Skin , right neck posterior BASAL CELL CARCINOMA, NODULAR PATTERN, BASE INVOLVED --> ED&C  2. Skin , right neck anterior WELL DIFFERENTIATED SQUAMOUS CELL CARCINOMA, BASE INVOLVED --> discussed excision vs ED&C. Given numerous skin cancers and small size, will proceed with ED&C and recheck site  3. Skin , right temple BASAL CELL CARCINOMA, NODULAR PATTERN, BASE INVOLVED --> Mohs vs ED&C. Will refer for Mohs and see if they can do multiple cancers at same visit given patient difficulty with travel. If not, plan ED&C.  4. Skin , left temple BASAL CELL CARCINOMA, NODULAR AND INFILTRATIVE PATTERNS, BASE INVOLVED --> Mohs. PRIORITY #1 FOR MOHS. Patient in agreement.  5. Skin , left zygoma BASAL CELL CARCINOMA, SUPERFICIAL AND NODULAR PATTERNS, BASE INVOLVED --> Mohs vs ED&C. Will refer for Mohs and see if they can do multiple skin cancers at one visit given patient difficulty with travel. If not possible, will plan ED&C and monitor site.  MAs please refer for Mohs at [facility]. Thank you!

## 2021-09-19 NOTE — Telephone Encounter (Signed)
Referral sent to [facility].Jamie Byrd

## 2021-09-24 ENCOUNTER — Emergency Department (HOSPITAL_COMMUNITY): Payer: Medicare Other

## 2021-09-24 ENCOUNTER — Inpatient Hospital Stay (HOSPITAL_COMMUNITY)
Admission: EM | Admit: 2021-09-24 | Discharge: 2021-09-27 | DRG: 247 | Disposition: A | Discharge status: Home / Self Care | Payer: Medicare Other | Attending: Internal Medicine | Admitting: Internal Medicine

## 2021-09-24 ENCOUNTER — Encounter (HOSPITAL_COMMUNITY): Payer: Self-pay | Admitting: Emergency Medicine

## 2021-09-24 ENCOUNTER — Other Ambulatory Visit: Payer: Self-pay

## 2021-09-24 DIAGNOSIS — Z801 Family history of malignant neoplasm of trachea, bronchus and lung: Secondary | ICD-10-CM

## 2021-09-24 DIAGNOSIS — H3530 Unspecified macular degeneration: Secondary | ICD-10-CM | POA: Diagnosis present

## 2021-09-24 DIAGNOSIS — Z8249 Family history of ischemic heart disease and other diseases of the circulatory system: Secondary | ICD-10-CM | POA: Diagnosis not present

## 2021-09-24 DIAGNOSIS — Z85828 Personal history of other malignant neoplasm of skin: Secondary | ICD-10-CM

## 2021-09-24 DIAGNOSIS — Z79899 Other long term (current) drug therapy: Secondary | ICD-10-CM | POA: Diagnosis not present

## 2021-09-24 DIAGNOSIS — R7303 Prediabetes: Secondary | ICD-10-CM | POA: Diagnosis present

## 2021-09-24 DIAGNOSIS — I25110 Atherosclerotic heart disease of native coronary artery with unstable angina pectoris: Secondary | ICD-10-CM | POA: Diagnosis not present

## 2021-09-24 DIAGNOSIS — N400 Enlarged prostate without lower urinary tract symptoms: Secondary | ICD-10-CM | POA: Diagnosis present

## 2021-09-24 DIAGNOSIS — I10 Essential (primary) hypertension: Secondary | ICD-10-CM | POA: Diagnosis not present

## 2021-09-24 DIAGNOSIS — Z5329 Procedure and treatment not carried out because of patient's decision for other reasons: Secondary | ICD-10-CM | POA: Diagnosis present

## 2021-09-24 DIAGNOSIS — E785 Hyperlipidemia, unspecified: Secondary | ICD-10-CM | POA: Diagnosis not present

## 2021-09-24 DIAGNOSIS — Z87891 Personal history of nicotine dependence: Secondary | ICD-10-CM

## 2021-09-24 DIAGNOSIS — Z8619 Personal history of other infectious and parasitic diseases: Secondary | ICD-10-CM

## 2021-09-24 DIAGNOSIS — Z79890 Hormone replacement therapy (postmenopausal): Secondary | ICD-10-CM | POA: Diagnosis not present

## 2021-09-24 DIAGNOSIS — L7632 Postprocedural hematoma of skin and subcutaneous tissue following other procedure: Secondary | ICD-10-CM | POA: Diagnosis not present

## 2021-09-24 DIAGNOSIS — I200 Unstable angina: Secondary | ICD-10-CM | POA: Diagnosis not present

## 2021-09-24 DIAGNOSIS — R0789 Other chest pain: Secondary | ICD-10-CM | POA: Diagnosis not present

## 2021-09-24 DIAGNOSIS — Y838 Other surgical procedures as the cause of abnormal reaction of the patient, or of later complication, without mention of misadventure at the time of the procedure: Secondary | ICD-10-CM | POA: Diagnosis not present

## 2021-09-24 DIAGNOSIS — R079 Chest pain, unspecified: Secondary | ICD-10-CM | POA: Diagnosis not present

## 2021-09-24 DIAGNOSIS — E039 Hypothyroidism, unspecified: Secondary | ICD-10-CM | POA: Diagnosis present

## 2021-09-24 DIAGNOSIS — Z20822 Contact with and (suspected) exposure to covid-19: Secondary | ICD-10-CM | POA: Diagnosis not present

## 2021-09-24 LAB — CK TOTAL AND CKMB (NOT AT ARMC)
CK, MB: 1.1 ng/mL (ref 0.5–5.0)
Relative Index: INVALID (ref 0.0–2.5)
Total CK: 66 U/L (ref 49–397)

## 2021-09-24 LAB — RESP PANEL BY RT-PCR (FLU A&B, COVID) ARPGX2
Influenza A by PCR: NEGATIVE
Influenza B by PCR: NEGATIVE
SARS Coronavirus 2 by RT PCR: NEGATIVE

## 2021-09-24 LAB — CBC
HCT: 43.7 % (ref 39.0–52.0)
Hemoglobin: 14.5 g/dL (ref 13.0–17.0)
MCH: 32.8 pg (ref 26.0–34.0)
MCHC: 33.2 g/dL (ref 30.0–36.0)
MCV: 98.9 fL (ref 80.0–100.0)
Platelets: 167 10*3/uL (ref 150–400)
RBC: 4.42 MIL/uL (ref 4.22–5.81)
RDW: 13.6 % (ref 11.5–15.5)
WBC: 6.5 10*3/uL (ref 4.0–10.5)
nRBC: 0 % (ref 0.0–0.2)

## 2021-09-24 LAB — BASIC METABOLIC PANEL
Anion gap: 9 (ref 5–15)
BUN: 12 mg/dL (ref 8–23)
CO2: 25 mmol/L (ref 22–32)
Calcium: 9.9 mg/dL (ref 8.9–10.3)
Chloride: 102 mmol/L (ref 98–111)
Creatinine, Ser: 1.06 mg/dL (ref 0.61–1.24)
GFR, Estimated: >60 mL/min (ref >60)
Glucose, Bld: 92 mg/dL (ref 70–99)
Potassium: 4.1 mmol/L (ref 3.5–5.1)
Sodium: 136 mmol/L (ref 135–145)

## 2021-09-24 LAB — PROTIME-INR
INR: 1.1 (ref 0.8–1.2)
Prothrombin Time: 13.9 seconds (ref 11.4–15.2)

## 2021-09-24 LAB — TROPONIN I (HIGH SENSITIVITY)
Troponin I (High Sensitivity): 5 ng/L (ref <18)
Troponin I (High Sensitivity): 6 ng/L (ref <18)

## 2021-09-24 MED ORDER — ASPIRIN 81 MG PO CHEW
324.0000 mg | CHEWABLE_TABLET | Freq: Once | ORAL | Status: AC
  Administered 2021-09-24: 324 mg via ORAL
  Filled 2021-09-24: qty 4

## 2021-09-24 MED ORDER — METOPROLOL TARTRATE 5 MG/5ML IV SOLN: 5.0000 mg | Freq: Four times a day (QID) | INTRAVENOUS | Status: DC | PRN

## 2021-09-24 MED ORDER — HEPARIN (PORCINE) 25000 UT/250ML-% IV SOLN
1200.0000 [IU]/h | INTRAVENOUS | Status: DC
  Administered 2021-09-24 – 2021-09-26 (×2): 1200 [IU]/h via INTRAVENOUS
  Filled 2021-09-24 (×2): qty 250

## 2021-09-24 MED ORDER — LEVOTHYROXINE SODIUM 88 MCG PO TABS
88.0000 ug | ORAL_TABLET | Freq: Every day | ORAL | Status: DC
  Administered 2021-09-26 – 2021-09-27 (×2): 88 ug via ORAL
  Filled 2021-09-24 (×3): qty 1

## 2021-09-24 MED ORDER — SENNOSIDES-DOCUSATE SODIUM 8.6-50 MG PO TABS: 1.0000 | ORAL_TABLET | Freq: Every evening | ORAL | Status: DC | PRN

## 2021-09-24 MED ORDER — LISINOPRIL 40 MG PO TABS
40.0000 mg | ORAL_TABLET | Freq: Every day | ORAL | Status: DC
  Administered 2021-09-25: 10:00:00 40 mg via ORAL
  Filled 2021-09-24: qty 2
  Filled 2021-09-24: qty 1

## 2021-09-24 MED ORDER — HEPARIN (PORCINE) 25000 UT/250ML-% IV SOLN: 1200.0000 [IU]/h | INTRAVENOUS | Status: DC

## 2021-09-24 MED ORDER — ROSUVASTATIN CALCIUM 20 MG PO TABS
20.0000 mg | ORAL_TABLET | Freq: Every day | ORAL | Status: DC
  Administered 2021-09-24 – 2021-09-27 (×4): 20 mg via ORAL
  Filled 2021-09-24 (×4): qty 1

## 2021-09-24 MED ORDER — ASPIRIN EC 81 MG PO TBEC
81.0000 mg | DELAYED_RELEASE_TABLET | Freq: Every day | ORAL | Status: DC
  Administered 2021-09-25 – 2021-09-27 (×3): 81 mg via ORAL
  Filled 2021-09-24 (×3): qty 1

## 2021-09-24 MED ORDER — ACETAMINOPHEN 325 MG PO TABS: 650.0000 mg | ORAL_TABLET | Freq: Four times a day (QID) | ORAL | Status: DC | PRN

## 2021-09-24 MED ORDER — SODIUM CHLORIDE 0.9% FLUSH
3.0000 mL | Freq: Two times a day (BID) | INTRAVENOUS | Status: DC
  Administered 2021-09-24 – 2021-09-25 (×3): 3 mL via INTRAVENOUS

## 2021-09-24 MED ORDER — ENOXAPARIN SODIUM 40 MG/0.4ML IJ SOSY
40.0000 mg | PREFILLED_SYRINGE | INTRAMUSCULAR | Status: DC
  Administered 2021-09-24: 40 mg via SUBCUTANEOUS
  Filled 2021-09-24: qty 0.4

## 2021-09-24 MED ORDER — ACETAMINOPHEN 650 MG RE SUPP: 650.0000 mg | Freq: Four times a day (QID) | RECTAL | Status: DC | PRN

## 2021-09-24 MED ORDER — ALLOPURINOL 100 MG PO TABS
100.0000 mg | ORAL_TABLET | Freq: Every day | ORAL | Status: DC
  Administered 2021-09-25 – 2021-09-27 (×3): 100 mg via ORAL
  Filled 2021-09-24 (×3): qty 1

## 2021-09-24 NOTE — ED Notes (Signed)
Patient is resting comfortably. Sitting on side of bed. Reminded pt that he is NPO after MN and offered snack and drink which he declined

## 2021-09-24 NOTE — Progress Notes (Signed)
ANTICOAGULATION CONSULT NOTE - Initial Consult  Pharmacy Consult for Heparin Indication: chest pain/ACS  No Known Allergies  Patient Measurements: Height: 6' (182.9 cm) Weight: 95.3 kg (210 lb) IBW/kg (Calculated) : 77.6 Heparin Dosing Weight: 95.3 kg  Vital Signs: Temp: 97.7 F (36.5 C) (12/13 1658) Temp Source: Oral (12/13 1658) BP: 136/79 (12/13 1815) Pulse Rate: 57 (12/13 1815)  Labs: Recent Labs    09/24/21 1438 09/24/21 1635  HGB 14.5  --   HCT 43.7  --   PLT 167  --   CREATININE 1.06  --   CKTOTAL  --  66  CKMB  --  1.1  TROPONINIHS 5 6    Estimated Creatinine Clearance: 61 mL/min (by C-G formula based on SCr of 1.06 mg/dL).   Medical History: Past Medical History:  Diagnosis Date   BCC (basal cell carcinoma) 09/12/2021   right neck posterior   BCC (basal cell carcinoma) 09/12/2021   right neck anterior   BCC (basal cell carcinoma) 09/12/2021   right temple   BCC (basal cell carcinoma) 09/12/2021   left temple   BCC (basal cell carcinoma) 09/12/2021   left zygoma   BPH (benign prostatic hypertrophy)    Difficulty reading    Difficulty seeing    Difficulty in seeing certain things and reading/ not legally blind!   Gout    History of shingles    Hypertension    Hypothyroidism    Macular degeneration     Medications:  (Not in a hospital admission)  Scheduled:   [START ON 09/25/2021] allopurinol  100 mg Oral Daily   [START ON 09/25/2021] aspirin EC  81 mg Oral Daily   enoxaparin (LOVENOX) injection  40 mg Subcutaneous Q24H   [START ON 09/25/2021] levothyroxine  88 mcg Oral Daily   [START ON 09/25/2021] lisinopril  40 mg Oral Daily   rosuvastatin  20 mg Oral Daily   sodium chloride flush  3 mL Intravenous Q12H   Infusions:   Assessment: 10 yom with a history of hypertension, hypothyroidism, prediabetes, no known CAD. Patient is presenting with chest pain. Heparin per pharmacy consult placed for chest pain/ACS.  Patient is not on  anticoagulation prior to arrival.  Hgb 14.5; plt 167  Goal of Therapy:  Heparin level 0.3-0.7 units/ml Monitor platelets by anticoagulation protocol: Yes   Plan:  No initial bolus Start heparin infusion at 1200 units/hr Check anti-Xa level in 6-8 hours and daily while on heparin Continue to monitor H&H and platelets  Lorelei Pont, PharmD, BCPS 09/24/2021 7:09 PM ED Clinical Pharmacist -  [phone]

## 2021-09-24 NOTE — H&P (Addendum)
History and Physical    Jamie Byrd LKT:[id number] DOB: 1934-03-20 DOA: 09/24/2021  PCP: Abner Greenspan, MD   Patient coming from: home  Chief Complaint  Patient presents with   Chest Pain      HPI: Jamie Byrd is a 87 y.o. male with medical history significant for hypertension, hypothyroidism, prediabetes, no known CAD presents to the ER for evaluation of chest pain, described as chest tightness brought on by exertion and resolves with rest, gradually worsening. Onset 7 days ago while walking. Patient denies any fever shortness of breath nausea vomiting or any radiation.  He has had limitation of his ability to do normal household chores due to the chest pain which is mostly exertional. Never had similar pain or any cardiac work up. Currently he does not have any chest pain.  ED Course: Vitals are stable, afebrile, chest x-ray no active disease, EKG nonischemic personally reviewed, troponin initial negative repeat troponin pending.  Given his nature of chest pain admission was requested for further evaluation for possible stress test. He got asa 325 mg in ED.  Assessment/Plan  Chest pain: Chest pain symptoms concerning is mostly brought on by exertion, but EKG nonischemic and troponin negative.  Given his age and risk factors will admit the patient for further work-up consulted  cardiology. Continue on aspirin 81 mg, check lipid panel hemoglobin A1c.  Hypothyroidism: Check TSH and continue home Synthroid Essential hypertension: Controlled resume home antihypertensives Prediabetes hx-check hemoglobin A1c  Body mass index is 28.48 kg/m.   Severity of Illness: Patient will be admitted anticipate hospital stay less than 2 midnights will be admitted under observation status  DVT prophylaxis: enoxaparin (LOVENOX) injection 40 mg Start: 09/24/21 1800  Code Status:   Code Status: Full Code  Family Communication: Admission, patients condition and plan of care including tests  being ordered have been discussed with the patient  who indicate understanding and agree with the plan and Code Status.  Consults called:  Dr Dema Severin  Review of Systems: All systems were reviewed and were negative except as mentioned in HPI above. Negative for fever Negative for headache Negative for shortness of breath  Past Medical History:  Diagnosis Date   BCC (basal cell carcinoma) 09/12/2021   right neck posterior   BCC (basal cell carcinoma) 09/12/2021   right neck anterior   BCC (basal cell carcinoma) 09/12/2021   right temple   BCC (basal cell carcinoma) 09/12/2021   left temple   BCC (basal cell carcinoma) 09/12/2021   left zygoma   BPH (benign prostatic hypertrophy)    Difficulty reading    Difficulty seeing    Difficulty in seeing certain things and reading/ not legally blind!   Gout    History of shingles    Hypertension    Hypothyroidism    Macular degeneration     Past Surgical History:  Procedure Laterality Date   COLONOSCOPY     POLYPECTOMY       reports that he quit smoking about 52 years ago. His smoking use included cigarettes. He has never used smokeless tobacco. He reports that he does not drink alcohol and does not use drugs.  No Known Allergies  Family History  Problem Relation Age of Onset   Heart disease Mother        CABG   Heart disease Father        CAD   Cancer Father        lung CA   Cancer Brother  lung CA     Prior to Admission medications   Medication Sig Start Date End Date Taking? Authorizing Provider  allopurinol (ZYLOPRIM) 100 MG tablet Take 1 tablet (100 mg total) by mouth daily. 05/23/21  Yes Tower, Wynelle Fanny, MD  levothyroxine (SYNTHROID) 88 MCG tablet Take 1 tablet (88 mcg total) by mouth daily. 05/23/21  Yes Tower, Wynelle Fanny, MD  lisinopril (ZESTRIL) 40 MG tablet Take 1 tablet (40 mg total) by mouth daily. 05/23/21  Yes Tower, Wynelle Fanny, MD  mupirocin ointment (BACTROBAN) 2 % Apply 1 application topically daily. With  dressing changes 09/12/21  Yes Moye, [place], MD  Multiple Vitamins-Minerals (PRESERVISION AREDS 2 PO) Take 1 tablet by mouth daily. Patient not taking: Reported on 09/24/2021    [provider]    Physical Exam: Vitals:   09/24/21 1614 09/24/21 1658 09/24/21 1700 09/24/21 1715  BP: 137/71 (!) 165/84 (!) 165/75 (!) 156/93  Pulse: 63 60 61 73  Resp: 14 18 20  (!) 21  Temp: 98.4 F (36.9 C) 97.7 F (36.5 C)    TempSrc:  Oral    SpO2: 96% 99% 100% 100%  Weight:      Height:        General exam: AAOx3 , NAD, weak appearing. HEENT:Oral mucosa moist, Ear/Nose WNL grossly, dentition normal. Respiratory system: bilaterally clear,no wheezing or crackles,no use of accessory muscle Cardiovascular system: S1 & S2 +, No JVD,. Gastrointestinal system: Abdomen soft, NT,ND, BS+ Nervous System:Alert, awake, moving extremities and grossly nonfocal Extremities: No edema, distal peripheral pulses palpable.  Skin: No rashes,no icterus. MSK: Normal muscle bulk,tone, power   Labs on Admission: I have personally reviewed following labs and imaging studies  CBC: Recent Labs  Lab 09/24/21 1438  WBC 6.5  HGB 14.5  HCT 43.7  MCV 98.9  PLT 578   Basic Metabolic Panel: Recent Labs  Lab 09/24/21 1438  NA 136  K 4.1  CL 102  CO2 25  GLUCOSE 92  BUN 12  CREATININE 1.06  CALCIUM 9.9   GFR: Estimated Creatinine Clearance: 61 mL/min (by C-G formula based on SCr of 1.06 mg/dL). Liver Function Tests: No results for input(s): AST, ALT, ALKPHOS, BILITOT, PROT, ALBUMIN in the last 168 hours. No results for input(s): LIPASE, AMYLASE in the last 168 hours. No results for input(s): AMMONIA in the last 168 hours. Coagulation Profile: No results for input(s): INR, PROTIME in the last 168 hours. Cardiac Enzymes: No results for input(s): CKTOTAL, CKMB, CKMBINDEX, TROPONINI in the last 168 hours. BNP (last 3 results) No results for input(s): PROBNP in the last 8760 hours. HbA1C: No results  for input(s): HGBA1C in the last 72 hours. CBG: No results for input(s): GLUCAP in the last 168 hours. Lipid Profile: No results for input(s): CHOL, HDL, LDLCALC, TRIG, CHOLHDL, LDLDIRECT in the last 72 hours. Thyroid Function Tests: No results for input(s): TSH, T4TOTAL, FREET4, T3FREE, THYROIDAB in the last 72 hours. Anemia Panel: No results for input(s): VITAMINB12, FOLATE, FERRITIN, TIBC, IRON, RETICCTPCT in the last 72 hours. Urine analysis:    Component Value Date/Time   COLORURINE lt. yellow 01/05/2008 1346   APPEARANCEUR Clear 01/05/2008 1346   LABSPEC <1.005 01/05/2008 1346   PHURINE 6.0 01/05/2008 1346   HGBUR negative 01/05/2008 1346   BILIRUBINUR negative 01/05/2008 1346   UROBILINOGEN 0.2 01/05/2008 1346   NITRITE negative 01/05/2008 1346    Radiological Exams on Admission: DG Chest 2 View  Result Date: 09/24/2021 CLINICAL DATA:  Intermittent chest pain for several days  EXAM: CHEST - 2 VIEW COMPARISON:  03/15/2019 FINDINGS: Frontal and lateral views of the chest demonstrate an unremarkable cardiac silhouette. No airspace disease, effusion, or pneumothorax. No acute bony abnormalities. IMPRESSION: 1. No acute intrathoracic process. Electronically Signed   By: Randa Ngo M.D.   On: 09/24/2021 15:16    Antonieta Pert MD [facility]  If 7PM-7AM, please contact night-coverage [email or web address]  09/24/2021, 5:59 PM

## 2021-09-24 NOTE — ED Provider Notes (Signed)
[facility] EMERGENCY DEPARTMENT Provider Note  CSN: [id number] Arrival date & time: 09/24/21 1405    History Chief Complaint  Patient presents with   Chest Pain    Jamie Byrd is a 87 y.o. male with history of HTN, no known CAD, reports about a week of gradually worsening exertional chest tightness, resolves with rest. Not currently having any pain. No SOB, nausea or pain radiating into arm or jaw. Does not smoke or drink alcohol. His chest pain has limited his ability to do his normal household chores such as chopping firewood. Currently pain free. No recent fever, cough, SOB, no leg swelling or travel.    Past Medical History:  Diagnosis Date   BCC (basal cell carcinoma) 09/12/2021   right neck posterior   BCC (basal cell carcinoma) 09/12/2021   right neck anterior   BCC (basal cell carcinoma) 09/12/2021   right temple   BCC (basal cell carcinoma) 09/12/2021   left temple   BCC (basal cell carcinoma) 09/12/2021   left zygoma   BPH (benign prostatic hypertrophy)    Difficulty reading    Difficulty seeing    Difficulty in seeing certain things and reading/ not legally blind!   Gout    History of shingles    Hypertension    Hypothyroidism    Macular degeneration     Past Surgical History:  Procedure Laterality Date   COLONOSCOPY     POLYPECTOMY      Family History  Problem Relation Age of Onset   Heart disease Mother        CABG   Heart disease Father        CAD   Cancer Father        lung CA   Cancer Brother        lung CA    Social History   Tobacco Use   Smoking status: Former    Types: Cigarettes    Quit date: 10/13/1968    Years since quitting: 52.9   Smokeless tobacco: Never  Vaping Use   Vaping Use: Never used  Substance Use Topics   Alcohol use: No    Comment: no alcohol in 20 years   Drug use: No     Home Medications Prior to Admission medications   Medication Sig Start Date End Date Taking? Authorizing Provider  allopurinol  (ZYLOPRIM) 100 MG tablet Take 1 tablet (100 mg total) by mouth daily. 05/23/21  Yes Tower, Wynelle Fanny, MD  levothyroxine (SYNTHROID) 88 MCG tablet Take 1 tablet (88 mcg total) by mouth daily. 05/23/21  Yes Tower, Wynelle Fanny, MD  lisinopril (ZESTRIL) 40 MG tablet Take 1 tablet (40 mg total) by mouth daily. 05/23/21  Yes Tower, Wynelle Fanny, MD  mupirocin ointment (BACTROBAN) 2 % Apply 1 application topically daily. With dressing changes 09/12/21  Yes Moye, [place], MD  Multiple Vitamins-Minerals (PRESERVISION AREDS 2 PO) Take 1 tablet by mouth daily. Patient not taking: Reported on 09/24/2021    [provider]     Allergies    Patient has no known allergies.   Review of Systems   Review of Systems A comprehensive review of systems was completed and negative except as noted in HPI.    Physical Exam BP (!) 165/84 (BP Location: Right Arm)    Pulse 60    Temp 97.7 F (36.5 C) (Oral)    Resp 18    Ht 6' (1.829 m)    Wt 95.3 kg    SpO2  99%    BMI 28.48 kg/m   Physical Exam Vitals and nursing note reviewed.  Constitutional:      Appearance: Normal appearance.  HENT:     Head: Normocephalic and atraumatic.     Nose: Nose normal.     Mouth/Throat:     Mouth: Mucous membranes are moist.  Eyes:     Extraocular Movements: Extraocular movements intact.     Conjunctiva/sclera: Conjunctivae normal.  Cardiovascular:     Rate and Rhythm: Normal rate.  Pulmonary:     Effort: Pulmonary effort is normal.     Breath sounds: Normal breath sounds.  Abdominal:     General: Abdomen is flat.     Palpations: Abdomen is soft.     Tenderness: There is no abdominal tenderness.  Musculoskeletal:        General: No swelling. Normal range of motion.     Cervical back: Neck supple.  Skin:    General: Skin is warm and dry.  Neurological:     General: No focal deficit present.     Mental Status: He is alert.  Psychiatric:        Mood and Affect: Mood normal.     ED Results / Procedures / Treatments    Labs (all labs ordered are listed, but only abnormal results are displayed) Labs Reviewed  RESP PANEL BY RT-PCR (FLU A&B, COVID) [id number]  BASIC METABOLIC PANEL  CBC  TROPONIN I (HIGH SENSITIVITY)  TROPONIN I (HIGH SENSITIVITY)    EKG EKG Interpretation  Date/Time:  Tuesday September 24 2021 14:27:01 EST Ventricular Rate:  75 PR Interval:  182 QRS Duration: 84 QT Interval:  376 QTC Calculation: 419 R Axis:   87 Text Interpretation: Normal sinus rhythm Low voltage QRS Cannot rule out Anteroseptal infarct , age undetermined Abnormal ECG No significant change since last tracing Confirmed by Calvert Cantor [phone]) on 09/24/2021 4:50:40 PM  Radiology DG Chest 2 View  Result Date: 09/24/2021 CLINICAL DATA:  Intermittent chest pain for several days EXAM: CHEST - 2 VIEW COMPARISON:  03/15/2019 FINDINGS: Frontal and lateral views of the chest demonstrate an unremarkable cardiac silhouette. No airspace disease, effusion, or pneumothorax. No acute bony abnormalities. IMPRESSION: 1. No acute intrathoracic process. Electronically Signed   By: Randa Ngo M.D.   On: 09/24/2021 15:16    Procedures Procedures  Medications Ordered in the ED Medications  aspirin chewable tablet 324 mg (has no administration in time range)     MDM Rules/Calculators/A&P MDM Patient with new worsening exertional chest pains. No pain now. EKG without acute ischemic changes and first Trop is neg. CBC and BMP normal. CXR is clear. Will give ASA and admit for further evaluation.   ED Course  I have reviewed the triage vital signs and the nursing notes.  Pertinent labs & imaging results that were available during my care of the patient were reviewed by me and considered in my medical decision making (see chart for details).  Clinical Course as of 09/24/21 1716  Tue Sep 24, 2021  1714 Spoke with Dr. Lupita Leash, hospitalist, who will evaluate for admission.  [CS]    Clinical Course User Index [CS] Truddie Hidden, MD    Final Clinical Impression(s) / ED Diagnoses Final diagnoses:  Unstable angina [facility])    Rx / DC Orders ED Discharge Orders     None        Truddie Hidden, MD 09/24/21 [phone]

## 2021-09-24 NOTE — ED Provider Notes (Signed)
Emergency Medicine Provider Triage Evaluation Note  Jamie Byrd , a 87 y.o. male  was evaluated in triage.  Pt complains of chest pain for the past 8 days.  Worse with exertion resolves with rest.  Chest pain is nonradiating, denies lightheadedness, palpitations.   Review of Systems  Positive: See above Negative: See above  Physical Exam  BP (!) 150/99 (BP Location: Right Arm)    Pulse 74    Temp 98 F (36.7 C) (Oral)    Resp 16    Ht 6' (1.829 m)    Wt 95.3 kg    SpO2 99%    BMI 28.48 kg/m  Gen:   Awake, no distress   Resp:  Normal effort  MSK:   Moves extremities without difficulty  Other:    Medical Decision Making  Medically screening exam initiated at 2:50 PM.  Appropriate orders placed.  Jamie Byrd was informed that the remainder of the evaluation will be completed by another provider, this initial triage assessment does not replace that evaluation, and the importance of remaining in the ED until their evaluation is complete.     Evlyn Courier, PA-C 09/24/21 1450    Daleen Bo, MD 09/25/21 1136

## 2021-09-24 NOTE — ED Notes (Signed)
ED Provider at bedside. 

## 2021-09-24 NOTE — ED Triage Notes (Signed)
Patient arrives ambulatory c/o chest pain going across his chest intermittently for the past 3-4 days. Patient states pain happens when he is doing something and subsides when he sits down and rests. Pain is non radiating and describes it as a tightness.

## 2021-09-24 NOTE — Consult Note (Signed)
CARDIOLOGY CONSULT NOTE  Patient ID: Jamie Byrd MRN: [id number] DOB/AGE: [birth year] 87 y.o.  Admit date: 09/24/2021 Attending physician: Antonieta Pert, MD  Primary Physician:  Tower, Wynelle Fanny, MD Outpatient Cardiologist: NA  Inpatient Cardiologist: Rex Kras, DO, [facility]  Reason of consultation: Chest Pain  Referring physician: Antonieta Pert, MD  Chief complaint: Chest pain  HPI:  Jamie Byrd is a 87 y.o. Caucasian male who presents with a chief complaint of "chest pain." His past medical history and cardiovascular risk factors include: Hypertension, hypothyroidism, prediabetes, former smoker, advanced age.  Presents to the hospital for evaluation of chest pain.  Initial onset of chest pain approximately 7 days ago while he was walking describes it as " a ton of bricks on my chest."  Since then he has been having exertional discomfort describing as tightness like sensation, brought on by effort related activities, resolves with rest.  Did not seek medical attention over the last 7 days thinking that it would resolve by itself.  He continues to have exertional discomfort with regular day-to-day activities.  He is also the primary caretaker of his wife who has dementia.  He went to a local fire station today and had his vitals checked as well as an EKG.  However due to his symptoms was requested to go to the ED for further evaluation.  At the time of evaluation patient states that his pain is 2 out of 10.  Denies orthopnea, paroxysmal nocturnal dyspnea or lower extremity swelling.  Functionally active.  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  ALLERGIES: No Known Allergies  PAST MEDICAL HISTORY: Past Medical History:  Diagnosis Date   BCC (basal cell carcinoma) 09/12/2021   right neck posterior   BCC (basal cell carcinoma) 09/12/2021   right neck anterior   BCC (basal cell carcinoma) 09/12/2021   right temple   BCC  (basal cell carcinoma) 09/12/2021   left temple   BCC (basal cell carcinoma) 09/12/2021   left zygoma   BPH (benign prostatic hypertrophy)    Difficulty reading    Difficulty seeing    Difficulty in seeing certain things and reading/ not legally blind!   Gout    History of shingles    Hypertension    Hypothyroidism    Macular degeneration     PAST SURGICAL HISTORY: Past Surgical History:  Procedure Laterality Date   COLONOSCOPY     POLYPECTOMY      FAMILY HISTORY: The patient's family history includes Cancer in his brother and father; Heart disease in his father and mother.   SOCIAL HISTORY:  The patient  reports that he quit smoking about 52 years ago. His smoking use included cigarettes. He has never used smokeless tobacco. He reports that he does not drink alcohol and does not use drugs.  MEDICATIONS: Current Outpatient Medications  Medication Instructions   allopurinol (ZYLOPRIM) 100 mg, Oral, Daily   levothyroxine (SYNTHROID) 88 mcg, Oral, Daily   lisinopril (ZESTRIL) 40 mg, Oral, Daily   Multiple Vitamins-Minerals (PRESERVISION AREDS 2 PO) 1 tablet, Daily   mupirocin ointment (BACTROBAN) 2 % 1 application, Topical, Daily, With dressing changes    REVIEW OF SYSTEMS: Review of Systems  Constitutional: Negative for chills and fever.  HENT:  Negative for hoarse voice and nosebleeds.   Eyes:  Negative for discharge, double vision and pain.  Cardiovascular:  Positive for chest pain. Negative for claudication, dyspnea on exertion, leg swelling, near-syncope, orthopnea, palpitations,  paroxysmal nocturnal dyspnea and syncope.  Respiratory:  Negative for hemoptysis and shortness of breath.   Musculoskeletal:  Negative for muscle cramps and myalgias.  Gastrointestinal:  Negative for abdominal pain, constipation, diarrhea, hematemesis, hematochezia, melena, nausea and vomiting.  Neurological:  Negative for dizziness and light-headedness.  All other systems reviewed and are  negative.  PHYSICAL EXAM: Vitals with BMI 09/24/2021 09/24/2021 09/24/2021  Height - - -  Weight - - -  BMI - - -  Systolic 259 563 875  Diastolic 93 75 84  Pulse 73 61 60    No intake or output data in the 24 hours ending 09/24/21 1806  Net IO Since Admission: No IO data has been entered for this period [09/24/21 1806]  CONSTITUTIONAL: Age-appropriate, hemodynamically stable, No acute distress.  SKIN: Skin is warm and dry. No rash noted. No cyanosis. No pallor. No jaundice HEAD: Normocephalic and atraumatic.  EYES: No scleral icterus MOUTH/THROAT: Moist oral membranes.  NECK: No JVD present. No thyromegaly noted. No carotid bruits  LYMPHATIC: No visible cervical adenopathy.  CHEST Normal respiratory effort. No intercostal retractions  LUNGS: Clear to auscultation bilaterally.  No stridor. No wheezes. No rales.  CARDIOVASCULAR: Regular rate and rhythm, positive S1-S2, no murmurs rubs or gallops appreciated ABDOMINAL: Soft, nontender, nondistended, positive bowel sounds in all 4 quadrants, no apparent ascites.  EXTREMITIES: No pitting edema, warm to touch.  HEMATOLOGIC: No significant bruising NEUROLOGIC: Oriented to person, place, and time. Nonfocal. Normal muscle tone.  PSYCHIATRIC: Normal mood and affect. Normal behavior. Cooperative  RADIOLOGY: DG Chest 2 View  Result Date: 09/24/2021 CLINICAL DATA:  Intermittent chest pain for several days EXAM: CHEST - 2 VIEW COMPARISON:  03/15/2019 FINDINGS: Frontal and lateral views of the chest demonstrate an unremarkable cardiac silhouette. No airspace disease, effusion, or pneumothorax. No acute bony abnormalities. IMPRESSION: 1. No acute intrathoracic process. Electronically Signed   By: Randa Ngo M.D.   On: 09/24/2021 15:16    LABORATORY DATA: Lab Results  Component Value Date   WBC 6.5 09/24/2021   HGB 14.5 09/24/2021   HCT 43.7 09/24/2021   MCV 98.9 09/24/2021   PLT 167 09/24/2021    Recent Labs  Lab 09/24/21 1438   NA 136  K 4.1  CL 102  CO2 25  BUN 12  CREATININE 1.06  CALCIUM 9.9  GLUCOSE 92    Lipid Panel  Lab Results  Component Value Date   CHOL 116 05/23/2021   HDL 29.70 (L) 05/23/2021   LDLCALC 58 05/23/2021   TRIG 139.0 05/23/2021   CHOLHDL 4 05/23/2021    BNP (last 3 results) No results for input(s): BNP in the last 8760 hours.  HEMOGLOBIN A1C Lab Results  Component Value Date   HGBA1C 5.3 05/23/2021    Cardiac Panel (last 3 results) Recent Labs    09/24/21 1438  TROPONINIHS 5     TSH Recent Labs    05/23/21 1001  TSH 5.52*     CARDIAC DATABASE: EKG: 09/24/2021: Normal sinus rhythm, 75 bpm, normal axis, old anteroseptal infarct, without underlying injury pattern.  No significant change compared to June 2020.  Echocardiogram: Pending  Stress Testing:  None  Heart Catheterization: None  IMPRESSION & RECOMMENDATIONS: TREYLEN GIBBS is a 87 y.o. Caucasian male whose past medical history and cardiovascular risk factors include: Hypertension, hypothyroidism, prediabetes, former smoker, advanced age.  Unstable angina Exertional chest pain which is progressive over the last 7 days. High sensitive troponin negative x1 (suspect second will be within  normal limits).  Check troponin T and BNP Has received aspirin 325 mg p.o. x1.  Continue aspirin 81 mg p.o. daily thereafter. Start Crestor 20 mg p.o. nightly.  Check fasting lipid profile. IV heparin drip for ACS protocol -no known contraindications to anticoagulation If he has recurrence of chest pain would recommend IV nitro for chest pain titration Continue home dose lisinopril. Echocardiogram -  to evaluate for structural heart disease and left ventricular systolic function. Discussed undergoing ischemic evaluation.  Given his symptoms concerning for unstable angina stress test would not be an ideal test of choice given the clinical setting as his pretest probability of having obstructive CAD is  high. Procedure of left heart catheterization with possible intervention was explained to the patient in detail.  The indication, alternatives, risks and benefits were reviewed.  Complications include but not limited to bleeding, infection, vascular injury, stroke, myocardial infection, arrhythmia, kidney injury requiring short-term/long-term dialysis, radiation-related injury in the case of prolonged fluoroscopy use, emergency cardiac surgery, and death. The patient understands the risks of serious complication is 1-2 in 6387 with diagnostic cardiac cath and 1-2% or less with angioplasty/stenting. The patient voices understanding and provides verbal feedback and wishes to proceed with coronary angiography with possible PCI. Will uptitrate GDMT and antianginal therapy based on the results of the echo and left heart catheterization.  Benign essential hypertension: Continue home medications.  Prediabetes: Defer to primary team  Former smoker: Educated on the importance of continued smoking cessation.  CRITICAL CARE Performed by: Rex Kras   Total critical care time: 47 minutes   Critical care time was exclusive of separately billable procedures and treating other patients.   Critical care was necessary to treat or prevent imminent or life-threatening deterioration.   Critical care was time spent personally by me on the following activities: development of treatment plan with patient as well as nursing, discussions with admitting physician, evaluation of patient's response to treatment, examination of patient, obtaining history from patient, ordering and performing treatments and interventions, ordering and review of laboratory studies, ordering and review of radiographic studies, pulse oximetry and re-evaluation of patient's condition.  Patient's questions and concerns were addressed to his satisfaction. He voices understanding of the instructions provided during this encounter.   This note  was created using a voice recognition software as a result there may be grammatical errors inadvertently enclosed that do not reflect the nature of this encounter. Every attempt is made to correct such errors.  Mechele Claude [facility]  Pager: [phone] Office: [phone] 09/24/2021, 6:06 PM

## 2021-09-24 NOTE — ED Notes (Signed)
Hospitalist at bedside 

## 2021-09-25 ENCOUNTER — Observation Stay (HOSPITAL_COMMUNITY): Payer: Medicare Other

## 2021-09-25 ENCOUNTER — Encounter (HOSPITAL_COMMUNITY)
Admission: EM | Disposition: A | Discharge status: Home / Self Care | Payer: Self-pay | Source: Home / Self Care | Attending: Internal Medicine

## 2021-09-25 DIAGNOSIS — Y838 Other surgical procedures as the cause of abnormal reaction of the patient, or of later complication, without mention of misadventure at the time of the procedure: Secondary | ICD-10-CM | POA: Diagnosis not present

## 2021-09-25 DIAGNOSIS — N400 Enlarged prostate without lower urinary tract symptoms: Secondary | ICD-10-CM | POA: Diagnosis present

## 2021-09-25 DIAGNOSIS — Z20822 Contact with and (suspected) exposure to covid-19: Secondary | ICD-10-CM | POA: Diagnosis present

## 2021-09-25 DIAGNOSIS — R7303 Prediabetes: Secondary | ICD-10-CM

## 2021-09-25 DIAGNOSIS — Z8619 Personal history of other infectious and parasitic diseases: Secondary | ICD-10-CM | POA: Diagnosis not present

## 2021-09-25 DIAGNOSIS — I200 Unstable angina: Secondary | ICD-10-CM

## 2021-09-25 DIAGNOSIS — Z79890 Hormone replacement therapy (postmenopausal): Secondary | ICD-10-CM | POA: Diagnosis not present

## 2021-09-25 DIAGNOSIS — H3530 Unspecified macular degeneration: Secondary | ICD-10-CM | POA: Diagnosis present

## 2021-09-25 DIAGNOSIS — Z5329 Procedure and treatment not carried out because of patient's decision for other reasons: Secondary | ICD-10-CM | POA: Diagnosis present

## 2021-09-25 DIAGNOSIS — Z85828 Personal history of other malignant neoplasm of skin: Secondary | ICD-10-CM | POA: Diagnosis not present

## 2021-09-25 DIAGNOSIS — I10 Essential (primary) hypertension: Secondary | ICD-10-CM

## 2021-09-25 DIAGNOSIS — E785 Hyperlipidemia, unspecified: Secondary | ICD-10-CM | POA: Diagnosis present

## 2021-09-25 DIAGNOSIS — Z801 Family history of malignant neoplasm of trachea, bronchus and lung: Secondary | ICD-10-CM | POA: Diagnosis not present

## 2021-09-25 DIAGNOSIS — I25110 Atherosclerotic heart disease of native coronary artery with unstable angina pectoris: Secondary | ICD-10-CM | POA: Diagnosis present

## 2021-09-25 DIAGNOSIS — E039 Hypothyroidism, unspecified: Secondary | ICD-10-CM | POA: Diagnosis present

## 2021-09-25 DIAGNOSIS — Z87891 Personal history of nicotine dependence: Secondary | ICD-10-CM | POA: Diagnosis not present

## 2021-09-25 DIAGNOSIS — Z8249 Family history of ischemic heart disease and other diseases of the circulatory system: Secondary | ICD-10-CM | POA: Diagnosis not present

## 2021-09-25 DIAGNOSIS — Z79899 Other long term (current) drug therapy: Secondary | ICD-10-CM | POA: Diagnosis not present

## 2021-09-25 DIAGNOSIS — L7632 Postprocedural hematoma of skin and subcutaneous tissue following other procedure: Secondary | ICD-10-CM | POA: Diagnosis not present

## 2021-09-25 HISTORY — PX: LEFT HEART CATH AND CORONARY ANGIOGRAPHY: CATH118249

## 2021-09-25 LAB — CBC
HCT: 42 % (ref 39.0–52.0)
Hemoglobin: 14 g/dL (ref 13.0–17.0)
MCH: 33.4 pg (ref 26.0–34.0)
MCHC: 33.3 g/dL (ref 30.0–36.0)
MCV: 100.2 fL — ABNORMAL HIGH (ref 80.0–100.0)
Platelets: 146 10*3/uL — ABNORMAL LOW (ref 150–400)
RBC: 4.19 MIL/uL — ABNORMAL LOW (ref 4.22–5.81)
RDW: 13.6 % (ref 11.5–15.5)
WBC: 5.3 10*3/uL (ref 4.0–10.5)
nRBC: 0 % (ref 0.0–0.2)

## 2021-09-25 LAB — ECHOCARDIOGRAM COMPLETE
Area-P 1/2: 2.48 cm2
Calc EF: 54.9 %
Height: 72 in
S' Lateral: 2.9 cm
Single Plane A2C EF: 59.6 %
Single Plane A4C EF: 51.7 %
Weight: 3360 oz

## 2021-09-25 LAB — COMPREHENSIVE METABOLIC PANEL
ALT: 15 U/L (ref 0–44)
AST: 25 U/L (ref 15–41)
Albumin: 3.7 g/dL (ref 3.5–5.0)
Alkaline Phosphatase: 98 U/L (ref 38–126)
Anion gap: 7 (ref 5–15)
BUN: 11 mg/dL (ref 8–23)
CO2: 25 mmol/L (ref 22–32)
Calcium: 9.3 mg/dL (ref 8.9–10.3)
Chloride: 101 mmol/L (ref 98–111)
Creatinine, Ser: 0.98 mg/dL (ref 0.61–1.24)
GFR, Estimated: >60 mL/min (ref >60)
Glucose, Bld: 94 mg/dL (ref 70–99)
Potassium: 3.9 mmol/L (ref 3.5–5.1)
Sodium: 133 mmol/L — ABNORMAL LOW (ref 135–145)
Total Bilirubin: 1.2 mg/dL (ref 0.3–1.2)
Total Protein: 6 g/dL — ABNORMAL LOW (ref 6.5–8.1)

## 2021-09-25 LAB — LIPID PANEL
Cholesterol: 117 mg/dL (ref 0–200)
HDL: 33 mg/dL — ABNORMAL LOW (ref >40)
LDL Cholesterol: 72 mg/dL (ref 0–99)
Total CHOL/HDL Ratio: 3.5 RATIO
Triglycerides: 58 mg/dL (ref <150)
VLDL: 12 mg/dL (ref 0–40)

## 2021-09-25 LAB — HEPARIN LEVEL (UNFRACTIONATED)
Heparin Unfractionated: 0.57 IU/mL (ref 0.30–0.70)
Heparin Unfractionated: 0.57 IU/mL (ref 0.30–0.70)

## 2021-09-25 SURGERY — LEFT HEART CATH AND CORONARY ANGIOGRAPHY
Anesthesia: LOCAL

## 2021-09-25 MED ORDER — FENTANYL CITRATE (PF) 100 MCG/2ML IJ SOLN
INTRAMUSCULAR | Status: DC | PRN
  Administered 2021-09-25: 25 ug via INTRAVENOUS

## 2021-09-25 MED ORDER — HEPARIN (PORCINE) IN NACL 1000-0.9 UT/500ML-% IV SOLN
INTRAVENOUS | Status: DC | PRN
  Administered 2021-09-25 (×2): 500 mL

## 2021-09-25 MED ORDER — LABETALOL HCL 5 MG/ML IV SOLN: 10.0000 mg | INTRAVENOUS | Status: AC | PRN

## 2021-09-25 MED ORDER — HYDRALAZINE HCL 20 MG/ML IJ SOLN: 10.0000 mg | INTRAMUSCULAR | Status: AC | PRN

## 2021-09-25 MED ORDER — SODIUM CHLORIDE 0.9 % IV SOLN: INTRAVENOUS | Status: AC

## 2021-09-25 MED ORDER — SODIUM CHLORIDE 0.9% FLUSH: 3.0000 mL | Freq: Two times a day (BID) | INTRAVENOUS | Status: DC

## 2021-09-25 MED ORDER — SODIUM CHLORIDE 0.9% FLUSH: 3.0000 mL | INTRAVENOUS | Status: DC | PRN

## 2021-09-25 MED ORDER — HEPARIN SODIUM (PORCINE) 1000 UNIT/ML IJ SOLN
INTRAMUSCULAR | Status: DC | PRN
  Administered 2021-09-25: 2000 [IU] via INTRAVENOUS
  Administered 2021-09-25: 3000 [IU] via INTRAVENOUS

## 2021-09-25 MED ORDER — HEPARIN SODIUM (PORCINE) 1000 UNIT/ML IJ SOLN
INTRAMUSCULAR | Status: AC
  Filled 2021-09-25: qty 10

## 2021-09-25 MED ORDER — HEPARIN (PORCINE) IN NACL 1000-0.9 UT/500ML-% IV SOLN
INTRAVENOUS | Status: AC
  Filled 2021-09-25: qty 1000

## 2021-09-25 MED ORDER — SODIUM CHLORIDE 0.9 % IV SOLN: 250.0000 mL | INTRAVENOUS | Status: DC | PRN

## 2021-09-25 MED ORDER — VERAPAMIL HCL 2.5 MG/ML IV SOLN
INTRAVENOUS | Status: DC | PRN
  Administered 2021-09-25 (×2): 10 mL via INTRA_ARTERIAL

## 2021-09-25 MED ORDER — MIDAZOLAM HCL 2 MG/2ML IJ SOLN
INTRAMUSCULAR | Status: DC | PRN
  Administered 2021-09-25: 1 mg via INTRAVENOUS

## 2021-09-25 MED ORDER — ACETAMINOPHEN 325 MG PO TABS: 650.0000 mg | ORAL_TABLET | ORAL | Status: DC | PRN

## 2021-09-25 MED ORDER — ONDANSETRON HCL 4 MG/2ML IJ SOLN: 4.0000 mg | Freq: Four times a day (QID) | INTRAMUSCULAR | Status: DC | PRN

## 2021-09-25 MED ORDER — NITROGLYCERIN IN D5W 200-5 MCG/ML-% IV SOLN: 2.0000 ug/min | INTRAVENOUS | Status: DC

## 2021-09-25 MED ORDER — LIDOCAINE HCL (PF) 1 % IJ SOLN
INTRAMUSCULAR | Status: DC | PRN
  Administered 2021-09-25: 2 mL

## 2021-09-25 MED ORDER — MIDAZOLAM HCL 2 MG/2ML IJ SOLN
INTRAMUSCULAR | Status: AC
  Filled 2021-09-25: qty 2

## 2021-09-25 MED ORDER — HYDROCHLOROTHIAZIDE 12.5 MG PO TABS
12.5000 mg | ORAL_TABLET | Freq: Every day | ORAL | Status: DC
Start: 2021-09-25 — End: 2021-09-26
  Administered 2021-09-25: 21:00:00 12.5 mg via ORAL
  Filled 2021-09-25 (×2): qty 1

## 2021-09-25 MED ORDER — LIDOCAINE HCL (PF) 1 % IJ SOLN
INTRAMUSCULAR | Status: AC
  Filled 2021-09-25: qty 30

## 2021-09-25 MED ORDER — SODIUM CHLORIDE 0.9 % IV SOLN: INTRAVENOUS | Status: DC

## 2021-09-25 MED ORDER — VERAPAMIL HCL 2.5 MG/ML IV SOLN
INTRAVENOUS | Status: AC
  Filled 2021-09-25: qty 2

## 2021-09-25 MED ORDER — IOHEXOL 350 MG/ML SOLN
INTRAVENOUS | Status: DC | PRN
  Administered 2021-09-25: 17:00:00 70 mL

## 2021-09-25 MED ORDER — FENTANYL CITRATE (PF) 100 MCG/2ML IJ SOLN
INTRAMUSCULAR | Status: AC
  Filled 2021-09-25: qty 2

## 2021-09-25 MED ORDER — NITROGLYCERIN IN D5W 200-5 MCG/ML-% IV SOLN
0.0000 ug/min | INTRAVENOUS | Status: DC
  Administered 2021-09-25: 19:00:00 5 ug/min via INTRAVENOUS
  Filled 2021-09-25: qty 250

## 2021-09-25 SURGICAL SUPPLY — 14 items
CATH 5FR JL3.5 JR4 ANG PIG MP (CATHETERS) ×1 IMPLANT
CATH INFINITI 5FR JL4 (CATHETERS) ×1 IMPLANT
CATH LAUNCHER 6FR EBU 3 (CATHETERS) ×1 IMPLANT
DEVICE RAD COMP TR BAND LRG (VASCULAR PRODUCTS) ×1 IMPLANT
GLIDESHEATH SLEND A-KIT 6F 22G (SHEATH) ×1 IMPLANT
GLIDESHEATH SLEND SS 6F .021 (SHEATH) IMPLANT
GUIDEWIRE INQWIRE 1.5J.035X260 (WIRE) IMPLANT
INQWIRE 1.5J .035X260CM (WIRE) ×2
KIT ENCORE 26 ADVANTAGE (KITS) ×1 IMPLANT
KIT HEART LEFT (KITS) ×3 IMPLANT
PACK CARDIAC CATHETERIZATION (CUSTOM PROCEDURE TRAY) ×3 IMPLANT
TRANSDUCER W/STOPCOCK (MISCELLANEOUS) ×3 IMPLANT
TUBING CIL FLEX 10 FLL-RA (TUBING) ×3 IMPLANT
WIRE HI TORQ VERSACORE-J 145CM (WIRE) ×1 IMPLANT

## 2021-09-25 NOTE — Progress Notes (Signed)
°  Echocardiogram 2D Echocardiogram has been performed.  Jamie Byrd 09/25/2021, 3:46 PM

## 2021-09-25 NOTE — H&P (View-Only) (Signed)
Progress Note  Patient Name: Jamie Byrd Date of Encounter: 09/25/2021  Attending physician: Hosie Poisson, MD Primary care provider: Abner Greenspan, MD  Subjective: Jamie Byrd is a 87 y.o. male who was seen and examined at bedside  No events overnight. Continues to have chest pain 1/10 Denies orthopnea, paroxysmal nocturnal dyspnea lower extremity swelling Case discussed and reviewed with his nurse.  Objective: Vital Signs in the last 24 hours: Temp:  [97.7 F (36.5 C)-98.4 F (36.9 C)] 97.8 F (36.6 C) (12/13 2236) Pulse Rate:  [46-82] 57 (12/14 1130) Resp:  [12-21] 12 (12/14 1130) BP: (115-165)/(61-99) 148/72 (12/14 1130) SpO2:  [96 %-100 %] 100 % (12/14 1130) Weight:  [95.3 kg] 95.3 kg (12/13 1427)  Intake/Output:  Intake/Output Summary (Last 24 hours) at 09/25/2021 1254 Last data filed at 09/25/2021 0741 Gross per 24 hour  Intake 274.22 ml  Output 1325 ml  Net -1050.78 ml    Net IO Since Admission: -1,050.78 mL [09/25/21 1254]  Weights:  Filed Weights   09/24/21 1427  Weight: 95.3 kg    Telemetry: Personally reviewed. Normal sinus  Physical examination: PHYSICAL EXAM: Vitals with BMI 09/25/2021 09/25/2021 09/25/2021  Height - - -  Weight - - -  BMI - - -  Systolic 938 101 751  Diastolic 72 75 74  Pulse 57 50 53    CONSTITUTIONAL: Age-appropriate, hemodynamically stable, No acute distress.  SKIN: Skin is warm and dry. No rash noted. No cyanosis. No pallor. No jaundice HEAD: Normocephalic and atraumatic.  EYES: No scleral icterus MOUTH/THROAT: Moist oral membranes.  NECK: No JVD present. No thyromegaly noted. No carotid bruits  LYMPHATIC: No visible cervical adenopathy.  CHEST Normal respiratory effort. No intercostal retractions  LUNGS: Clear to auscultation bilaterally.  No stridor. No wheezes. No rales.  CARDIOVASCULAR: Regular rate and rhythm, positive S1-S2, no murmurs rubs or gallops appreciated ABDOMINAL: Soft, nontender,  nondistended, positive bowel sounds in all 4 quadrants, no apparent ascites.  EXTREMITIES: No pitting edema, warm to touch.  HEMATOLOGIC: No significant bruising NEUROLOGIC: Oriented to person, place, and time. Nonfocal. Normal muscle tone.  PSYCHIATRIC: Normal mood and affect. Normal behavior. Cooperative  Lab Results: Hematology Recent Labs  Lab 09/24/21 1438 09/25/21 0334  WBC 6.5 5.3  RBC 4.42 4.19*  HGB 14.5 14.0  HCT 43.7 42.0  MCV 98.9 100.2*  MCH 32.8 33.4  MCHC 33.2 33.3  RDW 13.6 13.6  PLT 167 146*    Chemistry Recent Labs  Lab 09/24/21 1438 09/25/21 0334  NA 136 133*  K 4.1 3.9  CL 102 101  CO2 25 25  GLUCOSE 92 94  BUN 12 11  CREATININE 1.06 0.98  CALCIUM 9.9 9.3  PROT  --  6.0*  ALBUMIN  --  3.7  AST  --  25  ALT  --  15  ALKPHOS  --  98  BILITOT  --  1.2  GFRNONAA >60 >60  ANIONGAP 9 7     Cardiac Enzymes: Cardiac Panel (last 3 results) Recent Labs    09/24/21 1438 09/24/21 1635  CKTOTAL  --  66  CKMB  --  1.1  TROPONINIHS 5 6  RELINDX  --  RELATIVE INDEX IS INVALID    BNP (last 3 results) No results for input(s): BNP in the last 8760 hours.  ProBNP (last 3 results) No results for input(s): PROBNP in the last 8760 hours.   DDimer No results for input(s): DDIMER in the last 168 hours.   Hemoglobin  A1c:  Lab Results  Component Value Date   HGBA1C 5.3 05/23/2021    TSH  Recent Labs    05/23/21 1001  TSH 5.52*    Lipid Panel     Component Value Date/Time   CHOL 117 09/25/2021 0334   TRIG 58 09/25/2021 0334   HDL 33 (L) 09/25/2021 0334   CHOLHDL 3.5 09/25/2021 0334   VLDL 12 09/25/2021 0334   LDLCALC 72 09/25/2021 0334    Imaging: DG Chest 2 View  Result Date: 09/24/2021 CLINICAL DATA:  Intermittent chest pain for several days EXAM: CHEST - 2 VIEW COMPARISON:  03/15/2019 FINDINGS: Frontal and lateral views of the chest demonstrate an unremarkable cardiac silhouette. No airspace disease, effusion, or pneumothorax.  No acute bony abnormalities. IMPRESSION: 1. No acute intrathoracic process. Electronically Signed   By: Randa Ngo M.D.   On: 09/24/2021 15:16    CARDIAC DATABASE: EKG: 09/24/2021: Normal sinus rhythm, 75 bpm, normal axis, old anteroseptal infarct, without underlying injury pattern.  No significant change compared to June 2020.   Echocardiogram: Pending   Stress Testing:  None   Heart Catheterization: None  Scheduled Meds:  allopurinol  100 mg Oral Daily   aspirin EC  81 mg Oral Daily   levothyroxine  88 mcg Oral Daily   lisinopril  40 mg Oral Daily   rosuvastatin  20 mg Oral Daily   sodium chloride flush  3 mL Intravenous Q12H    Continuous Infusions:  sodium chloride     sodium chloride 75 mL/hr at 09/25/21 0741   heparin 1,200 Units/hr (09/25/21 0741)    PRN Meds: sodium chloride, acetaminophen **OR** acetaminophen, metoprolol tartrate, senna-docusate, sodium chloride flush   IMPRESSION & RECOMMENDATIONS: Jamie Byrd is a 87 y.o. Caucasian male whose past medical history and cardiac risk factors include: Hypertension, hypothyroidism, prediabetes, former smoker, advanced age.  Unstable angina: Chest pain currently 1 out of 10. Currently on IV heparin drip High sensitive troponins negative x2, EKG sinus mechanism without underlying injury pattern. Initial event more than a week ago and continues to have effort related chest pain resolved with rest. Most recent lipid profile reviewed. LDL 72 mg/dL. Continue aspirin and statin therapy. Not on beta-blockers due to bradycardia. Sublingual nitroglycerin to use on a as needed basis. Echocardiogram pending Scheduled for left heart catheterization with possible intervention.  Informed consent obtained yesterday.  Patient has no additional questions with regards to the scheduled left heart catheterization.  Benign essential hypertension: Continue current medical therapy.  Start hydrochlorothiazide 12.5 mg p.o.  daily  Prediabetes: Per primary team  Former smoker: Educated on the importance of continued smoking cessation.   Total time spent 25 minutes.  Patient's questions and concerns were addressed to his satisfaction. He voices understanding of the instructions provided during this encounter.   This note was created using a voice recognition software as a result there may be grammatical errors inadvertently enclosed that do not reflect the nature of this encounter. Every attempt is made to correct such errors.  Jamie Byrd [facility]  Pager: [phone] Office: [phone] 09/25/2021, 12:54 PM

## 2021-09-25 NOTE — Interval H&P Note (Signed)
History and Physical Interval Note:  09/25/2021 3:37 PM  Jamie Byrd  has presented today for surgery, with the diagnosis of unstable angina.  The various methods of treatment have been discussed with the patient and family. After consideration of risks, benefits and other options for treatment, the patient has consented to  Procedure(s): LEFT HEART CATH AND CORONARY ANGIOGRAPHY (N/A) as a surgical intervention.  The patient's history has been reviewed, patient examined, no change in status, stable for surgery.  I have reviewed the patient's chart and labs.  Questions were answered to the patient's satisfaction.    2016 Appropriate Use Criteria for Coronary Revascularization in Patients With Acute Coronary Syndrome NSTEMI/Unstable angina, stabilized patient at Intermediate Risk (TIMI Score 3-4) Indication:  Revascularization by PCI or CABG of 1 or more arteries in a patient with NSTEMI or unstable angina with Stabilization after presentation Intermediate risk for clinical events A (7) Indication: 16; Score 7   [place]

## 2021-09-25 NOTE — ED Notes (Signed)
Pt resting in stretcher. Verbalizes understanding of plan of care, to remain NPO until cath. Pt voided into urinal. Pt has call bell in reach, denies further needs, denies pain. No distress noted.

## 2021-09-25 NOTE — Progress Notes (Signed)
ANTICOAGULATION CONSULT NOTE  Pharmacy Consult for Heparin Indication: Unstable angina  No Known Allergies  Patient Measurements: Height: 6' (182.9 cm) Weight: 95.3 kg (210 lb) IBW/kg (Calculated) : 77.6 Heparin Dosing Weight: 95.3 kg  Vital Signs: Temp: 97.8 F (36.6 C) (12/13 2236) Temp Source: Oral (12/13 2236) BP: 139/75 (12/14 0900) Pulse Rate: 50 (12/14 0900)  Labs: Recent Labs    09/24/21 1438 09/24/21 1635 09/24/21 2038 09/25/21 0142 09/25/21 0334 09/25/21 0818  HGB 14.5  --   --   --  14.0  --   HCT 43.7  --   --   --  42.0  --   PLT 167  --   --   --  146*  --   LABPROT  --   --  13.9  --   --   --   INR  --   --  1.1  --   --   --   HEPARINUNFRC  --   --   --  0.57  --  0.57  CREATININE 1.06  --   --   --  0.98  --   CKTOTAL  --  66  --   --   --   --   CKMB  --  1.1  --   --   --   --   TROPONINIHS 5 6  --   --   --   --      Estimated Creatinine Clearance: 66 mL/min (by C-G formula based on SCr of 0.98 mg/dL).   Assessment: 46 YOM presented with chest pain and Pharmacy consult for IV heparin.  Heparin level therapeutic; no bleeding reported; CBC stable.  Goal of Therapy:  Heparin level 0.3-0.7 units/ml Monitor platelets by anticoagulation protocol: Yes   Plan:  Continue heparin infusion at 1200 units/hr Daily heparin level and CBC  Deonna Krummel D. Mina Marble, PharmD, BCPS, [place] 09/25/2021, 9:49 AM

## 2021-09-25 NOTE — Progress Notes (Signed)
ANTICOAGULATION CONSULT NOTE - Follow Up Consult  Pharmacy Consult for heparin Indication:  USAP  Labs: Recent Labs    09/24/21 1438 09/24/21 1635 09/24/21 2038 09/25/21 0142  HGB 14.5  --   --   --   HCT 43.7  --   --   --   PLT 167  --   --   --   LABPROT  --   --  13.9  --   INR  --   --  1.1  --   HEPARINUNFRC  --   --   --  0.57  CREATININE 1.06  --   --   --   CKTOTAL  --  66  --   --   CKMB  --  1.1  --   --   TROPONINIHS 5 6  --   --     Assessment/Plan:  87yo male therapeutic on heparin with initial dosing for CP. Will continue infusion at current rate of 1200 units/hr and confirm stable with additional level.   Wynona Neat, PharmD, BCPS  09/25/2021,2:59 AM

## 2021-09-25 NOTE — Progress Notes (Signed)
Progress Note  Patient Name: Jamie Byrd Date of Encounter: 09/25/2021  Attending physician: Hosie Poisson, MD Primary care provider: Abner Greenspan, MD  Subjective: Jamie Byrd is a 87 y.o. male who was seen and examined at bedside  No events overnight. Continues to have chest pain 1/10 Denies orthopnea, paroxysmal nocturnal dyspnea lower extremity swelling Case discussed and reviewed with his nurse.  Objective: Vital Signs in the last 24 hours: Temp:  [97.7 F (36.5 C)-98.4 F (36.9 C)] 97.8 F (36.6 C) (12/13 2236) Pulse Rate:  [46-82] 57 (12/14 1130) Resp:  [12-21] 12 (12/14 1130) BP: (115-165)/(61-99) 148/72 (12/14 1130) SpO2:  [96 %-100 %] 100 % (12/14 1130) Weight:  [95.3 kg] 95.3 kg (12/13 1427)  Intake/Output:  Intake/Output Summary (Last 24 hours) at 09/25/2021 1254 Last data filed at 09/25/2021 0741 Gross per 24 hour  Intake 274.22 ml  Output 1325 ml  Net -1050.78 ml    Net IO Since Admission: -1,050.78 mL [09/25/21 1254]  Weights:  Filed Weights   09/24/21 1427  Weight: 95.3 kg    Telemetry: Personally reviewed. Normal sinus  Physical examination: PHYSICAL EXAM: Vitals with BMI 09/25/2021 09/25/2021 09/25/2021  Height - - -  Weight - - -  BMI - - -  Systolic 962 952 841  Diastolic 72 75 74  Pulse 57 50 53    CONSTITUTIONAL: Age-appropriate, hemodynamically stable, No acute distress.  SKIN: Skin is warm and dry. No rash noted. No cyanosis. No pallor. No jaundice HEAD: Normocephalic and atraumatic.  EYES: No scleral icterus MOUTH/THROAT: Moist oral membranes.  NECK: No JVD present. No thyromegaly noted. No carotid bruits  LYMPHATIC: No visible cervical adenopathy.  CHEST Normal respiratory effort. No intercostal retractions  LUNGS: Clear to auscultation bilaterally.  No stridor. No wheezes. No rales.  CARDIOVASCULAR: Regular rate and rhythm, positive S1-S2, no murmurs rubs or gallops appreciated ABDOMINAL: Soft, nontender,  nondistended, positive bowel sounds in all 4 quadrants, no apparent ascites.  EXTREMITIES: No pitting edema, warm to touch.  HEMATOLOGIC: No significant bruising NEUROLOGIC: Oriented to person, place, and time. Nonfocal. Normal muscle tone.  PSYCHIATRIC: Normal mood and affect. Normal behavior. Cooperative  Lab Results: Hematology Recent Labs  Lab 09/24/21 1438 09/25/21 0334  WBC 6.5 5.3  RBC 4.42 4.19*  HGB 14.5 14.0  HCT 43.7 42.0  MCV 98.9 100.2*  MCH 32.8 33.4  MCHC 33.2 33.3  RDW 13.6 13.6  PLT 167 146*    Chemistry Recent Labs  Lab 09/24/21 1438 09/25/21 0334  NA 136 133*  K 4.1 3.9  CL 102 101  CO2 25 25  GLUCOSE 92 94  BUN 12 11  CREATININE 1.06 0.98  CALCIUM 9.9 9.3  PROT  --  6.0*  ALBUMIN  --  3.7  AST  --  25  ALT  --  15  ALKPHOS  --  98  BILITOT  --  1.2  GFRNONAA >60 >60  ANIONGAP 9 7     Cardiac Enzymes: Cardiac Panel (last 3 results) Recent Labs    09/24/21 1438 09/24/21 1635  CKTOTAL  --  66  CKMB  --  1.1  TROPONINIHS 5 6  RELINDX  --  RELATIVE INDEX IS INVALID    BNP (last 3 results) No results for input(s): BNP in the last 8760 hours.  ProBNP (last 3 results) No results for input(s): PROBNP in the last 8760 hours.   DDimer No results for input(s): DDIMER in the last 168 hours.   Hemoglobin  A1c:  Lab Results  Component Value Date   HGBA1C 5.3 05/23/2021    TSH  Recent Labs    05/23/21 1001  TSH 5.52*    Lipid Panel     Component Value Date/Time   CHOL 117 09/25/2021 0334   TRIG 58 09/25/2021 0334   HDL 33 (L) 09/25/2021 0334   CHOLHDL 3.5 09/25/2021 0334   VLDL 12 09/25/2021 0334   LDLCALC 72 09/25/2021 0334    Imaging: DG Chest 2 View  Result Date: 09/24/2021 CLINICAL DATA:  Intermittent chest pain for several days EXAM: CHEST - 2 VIEW COMPARISON:  03/15/2019 FINDINGS: Frontal and lateral views of the chest demonstrate an unremarkable cardiac silhouette. No airspace disease, effusion, or pneumothorax.  No acute bony abnormalities. IMPRESSION: 1. No acute intrathoracic process. Electronically Signed   By: Randa Ngo M.D.   On: 09/24/2021 15:16    CARDIAC DATABASE: EKG: 09/24/2021: Normal sinus rhythm, 75 bpm, normal axis, old anteroseptal infarct, without underlying injury pattern.  No significant change compared to June 2020.   Echocardiogram: Pending   Stress Testing:  None   Heart Catheterization: None  Scheduled Meds:  allopurinol  100 mg Oral Daily   aspirin EC  81 mg Oral Daily   levothyroxine  88 mcg Oral Daily   lisinopril  40 mg Oral Daily   rosuvastatin  20 mg Oral Daily   sodium chloride flush  3 mL Intravenous Q12H    Continuous Infusions:  sodium chloride     sodium chloride 75 mL/hr at 09/25/21 0741   heparin 1,200 Units/hr (09/25/21 0741)    PRN Meds: sodium chloride, acetaminophen **OR** acetaminophen, metoprolol tartrate, senna-docusate, sodium chloride flush   IMPRESSION & RECOMMENDATIONS: Jamie Byrd is a 87 y.o. Caucasian male whose past medical history and cardiac risk factors include: Hypertension, hypothyroidism, prediabetes, former smoker, advanced age.  Unstable angina: Chest pain currently 1 out of 10. Currently on IV heparin drip High sensitive troponins negative x2, EKG sinus mechanism without underlying injury pattern. Initial event more than a week ago and continues to have effort related chest pain resolved with rest. Most recent lipid profile reviewed. LDL 72 mg/dL. Continue aspirin and statin therapy. Not on beta-blockers due to bradycardia. Sublingual nitroglycerin to use on a as needed basis. Echocardiogram pending Scheduled for left heart catheterization with possible intervention.  Informed consent obtained yesterday.  Patient has no additional questions with regards to the scheduled left heart catheterization.  Benign essential hypertension: Continue current medical therapy.  Start hydrochlorothiazide 12.5 mg p.o.  daily  Prediabetes: Per primary team  Former smoker: Educated on the importance of continued smoking cessation.   Total time spent 25 minutes.  Patient's questions and concerns were addressed to his satisfaction. He voices understanding of the instructions provided during this encounter.   This note was created using a voice recognition software as a result there may be grammatical errors inadvertently enclosed that do not reflect the nature of this encounter. Every attempt is made to correct such errors.  Mechele Claude [facility]  Pager: [phone] Office: [phone] 09/25/2021, 12:54 PM

## 2021-09-25 NOTE — Progress Notes (Signed)
PROGRESS NOTE    Jamie Byrd  FBP:[id number] DOB: 12-Apr-1934 DOA: 09/24/2021 PCP: Abner Greenspan, MD  Chief Complaint  Patient presents with   Chest Pain    Brief Narrative:   Jamie Byrd is a 87 y.o. male with medical history significant for hypertension, hypothyroidism, prediabetes, no known CAD presents to the ER for evaluation of chest pain, described as chest tightness brought on by exertion and resolves with rest, gradually worsening.. he was admitted for unstable angina.   Assessment & Plan:   Principal Problem:   Chest pain Active Problems:   Hypothyroidism   Essential hypertension   Benign prostatic hyperplasia   Prediabetes  Unstable angina:  Continue with IV heparin, scheduled for left heart cath.  Echocardiogram pending.  Chest pain appears to have much improved.     Hypertension:  Well controlled.  Resume the home meds.    Hypothyroidism:  Resume synthroid.    Hyperlipidemia:  Resume statin.      DVT prophylaxis: Heparin Code Status: (Full Code) Family Communication: none at bedside.  Disposition:   Status is: Observation  The patient will require care spanning > 2 midnights and should be moved to inpatient because: Left Heart Catheterization.       Consultants:  Cardiology Dr Terri Skains  Procedures: echocardiogram.  Left heart cath on 09/25/21  Antimicrobials: none.    Subjective: No chest pain today.   Objective: Vitals:   09/25/21 0330 09/25/21 0645 09/25/21 0900 09/25/21 1130  BP: 133/71 133/74 139/75 (!) 148/72  Pulse: (!) 53 (!) 53 (!) 50 (!) 57  Resp: 16 18 14 12   Temp:      TempSrc:      SpO2: 100% 100% 99% 100%  Weight:      Height:        Intake/Output Summary (Last 24 hours) at 09/25/2021 1318 Last data filed at 09/25/2021 0741 Gross per 24 hour  Intake 274.22 ml  Output 1325 ml  Net -1050.78 ml   Filed Weights   09/24/21 1427  Weight: 95.3 kg    Examination:  General exam: Appears calm and  comfortable  Respiratory system: Clear to auscultation. Respiratory effort normal. Cardiovascular system: S1 & S2 heard, RRR. No JVD,  No pedal edema. Gastrointestinal system: Abdomen is nondistended, soft and nontender.. Normal bowel sounds heard. Central nervous system: Alert and oriented. No focal neurological deficits. Extremities: Symmetric 5 x 5 power. Skin: No rashes, lesions or ulcers Psychiatry: Mood & affect appropriate.     Data Reviewed: I have personally reviewed following labs and imaging studies  CBC: Recent Labs  Lab 09/24/21 1438 09/25/21 0334  WBC 6.5 5.3  HGB 14.5 14.0  HCT 43.7 42.0  MCV 98.9 100.2*  PLT 167 146*    Basic Metabolic Panel: Recent Labs  Lab 09/24/21 1438 09/25/21 0334  NA 136 133*  K 4.1 3.9  CL 102 101  CO2 25 25  GLUCOSE 92 94  BUN 12 11  CREATININE 1.06 0.98  CALCIUM 9.9 9.3    GFR: Estimated Creatinine Clearance: 66 mL/min (by C-G formula based on SCr of 0.98 mg/dL).  Liver Function Tests: Recent Labs  Lab 09/25/21 0334  AST 25  ALT 15  ALKPHOS 98  BILITOT 1.2  PROT 6.0*  ALBUMIN 3.7    CBG: No results for input(s): GLUCAP in the last 168 hours.   Recent Results (from the past 240 hour(s))  Resp Panel by RT-PCR (Flu A&B, Covid) Nasopharyngeal Swab  Status: None   Collection Time: 09/24/21  5:01 PM   Specimen: Nasopharyngeal Swab; Nasopharyngeal(NP) swabs in vial transport medium  Result Value Ref Range Status   SARS Coronavirus 2 by RT PCR NEGATIVE NEGATIVE Final    Comment: (NOTE) SARS-CoV-2 target nucleic acids are NOT DETECTED.  The SARS-CoV-2 RNA is generally detectable in upper respiratory specimens during the acute phase of infection. The lowest concentration of SARS-CoV-2 viral copies this assay can detect is 138 copies/mL. A negative result does not preclude SARS-Cov-2 infection and should not be used as the sole basis for treatment or other patient management decisions. A negative result may  occur with  improper specimen collection/handling, submission of specimen other than nasopharyngeal swab, presence of viral mutation(s) within the areas targeted by this assay, and inadequate number of viral copies(<138 copies/mL). A negative result must be combined with clinical observations, patient history, and epidemiological information. The expected result is Negative.  Fact Sheet for Patients:  EntrepreneurPulse.com.au  Fact Sheet for Healthcare Providers:  IncredibleEmployment.be  This test is no t yet approved or cleared by the [place] FDA and  has been authorized for detection and/or diagnosis of SARS-CoV-2 by FDA under an Emergency Use Authorization (EUA). This EUA will remain  in effect (meaning this test can be used) for the duration of the COVID-19 declaration under Section 564(b)(1) of the Act, 21 U.S.C.section 360bbb-3(b)(1), unless the authorization is terminated  or revoked sooner.       Influenza A by PCR NEGATIVE NEGATIVE Final   Influenza B by PCR NEGATIVE NEGATIVE Final    Comment: (NOTE) The Xpert Xpress SARS-CoV-2/FLU/RSV plus assay is intended as an aid in the diagnosis of influenza from Nasopharyngeal swab specimens and should not be used as a sole basis for treatment. Nasal washings and aspirates are unacceptable for Xpert Xpress SARS-CoV-2/FLU/RSV testing.  Fact Sheet for Patients: EntrepreneurPulse.com.au  Fact Sheet for Healthcare Providers: IncredibleEmployment.be  This test is not yet approved or cleared by the [place] FDA and has been authorized for detection and/or diagnosis of SARS-CoV-2 by FDA under an Emergency Use Authorization (EUA). This EUA will remain in effect (meaning this test can be used) for the duration of the COVID-19 declaration under Section 564(b)(1) of the Act, 21 U.S.C. section 360bbb-3(b)(1), unless the authorization is terminated  or revoked.  Performed at [facility], [place] [address], [place], [place] [address]          Radiology Studies: DG Chest 2 View  Result Date: 09/24/2021 CLINICAL DATA:  Intermittent chest pain for several days EXAM: CHEST - 2 VIEW COMPARISON:  03/15/2019 FINDINGS: Frontal and lateral views of the chest demonstrate an unremarkable cardiac silhouette. No airspace disease, effusion, or pneumothorax. No acute bony abnormalities. IMPRESSION: 1. No acute intrathoracic process. Electronically Signed   By: Randa Ngo M.D.   On: 09/24/2021 15:16        Scheduled Meds:  allopurinol  100 mg Oral Daily   aspirin EC  81 mg Oral Daily   hydrochlorothiazide  12.5 mg Oral Daily   levothyroxine  88 mcg Oral Daily   lisinopril  40 mg Oral Daily   rosuvastatin  20 mg Oral Daily   sodium chloride flush  3 mL Intravenous Q12H   Continuous Infusions:  sodium chloride     sodium chloride 75 mL/hr at 09/25/21 0741   heparin 1,200 Units/hr (09/25/21 0741)     LOS: 0 days       Hosie Poisson, MD [facility]   To contact the attending provider between 7A-7P or the covering provider during after hours 7P-7A, please log into the web site [email or web address] and access using universal [place] password for that web site. If you do not have the password, please call the hospital operator.  09/25/2021, 1:18 PM

## 2021-09-26 ENCOUNTER — Encounter (HOSPITAL_COMMUNITY): Payer: Self-pay | Admitting: Cardiology

## 2021-09-26 ENCOUNTER — Encounter (HOSPITAL_COMMUNITY)
Admission: EM | Disposition: A | Discharge status: Home / Self Care | Payer: Self-pay | Source: Home / Self Care | Attending: Internal Medicine

## 2021-09-26 HISTORY — PX: CORONARY STENT INTERVENTION: CATH118234

## 2021-09-26 HISTORY — PX: INTRAVASCULAR IMAGING/OCT: CATH118326

## 2021-09-26 HISTORY — PX: CORONARY ATHERECTOMY: CATH118238

## 2021-09-26 LAB — CBC
HCT: 37.7 % — ABNORMAL LOW (ref 39.0–52.0)
Hemoglobin: 13 g/dL (ref 13.0–17.0)
MCH: 33.5 pg (ref 26.0–34.0)
MCHC: 34.5 g/dL (ref 30.0–36.0)
MCV: 97.2 fL (ref 80.0–100.0)
Platelets: 144 10*3/uL — ABNORMAL LOW (ref 150–400)
RBC: 3.88 MIL/uL — ABNORMAL LOW (ref 4.22–5.81)
RDW: 13.5 % (ref 11.5–15.5)
WBC: 6.1 10*3/uL (ref 4.0–10.5)
nRBC: 0 % (ref 0.0–0.2)

## 2021-09-26 LAB — BASIC METABOLIC PANEL
Anion gap: 6 (ref 5–15)
BUN: 13 mg/dL (ref 8–23)
CO2: 27 mmol/L (ref 22–32)
Calcium: 9 mg/dL (ref 8.9–10.3)
Chloride: 105 mmol/L (ref 98–111)
Creatinine, Ser: 1.08 mg/dL (ref 0.61–1.24)
GFR, Estimated: >60 mL/min (ref >60)
Glucose, Bld: 101 mg/dL — ABNORMAL HIGH (ref 70–99)
Potassium: 4.6 mmol/L (ref 3.5–5.1)
Sodium: 138 mmol/L (ref 135–145)

## 2021-09-26 LAB — POCT ACTIVATED CLOTTING TIME
Activated Clotting Time: 311 seconds
Activated Clotting Time: 305 seconds
Activated Clotting Time: 323 seconds
Activated Clotting Time: 359 seconds

## 2021-09-26 LAB — HEPARIN LEVEL (UNFRACTIONATED)
Heparin Unfractionated: <0.1 IU/mL — ABNORMAL LOW (ref 0.30–0.70)
Heparin Unfractionated: 0.29 IU/mL — ABNORMAL LOW (ref 0.30–0.70)

## 2021-09-26 LAB — BRAIN NATRIURETIC PEPTIDE: B Natriuretic Peptide: 92.5 pg/mL (ref 0.0–100.0)

## 2021-09-26 SURGERY — CORONARY STENT INTERVENTION
Anesthesia: LOCAL

## 2021-09-26 MED ORDER — MIDAZOLAM HCL 2 MG/2ML IJ SOLN
INTRAMUSCULAR | Status: DC | PRN
  Administered 2021-09-26 (×3): 1 mg via INTRAVENOUS

## 2021-09-26 MED ORDER — SODIUM CHLORIDE 0.9% FLUSH: 3.0000 mL | INTRAVENOUS | Status: DC | PRN

## 2021-09-26 MED ORDER — METOPROLOL TARTRATE 12.5 MG HALF TABLET
12.5000 mg | ORAL_TABLET | Freq: Two times a day (BID) | ORAL | Status: DC
  Administered 2021-09-26: 12.5 mg via ORAL
  Filled 2021-09-26: qty 1

## 2021-09-26 MED ORDER — HEPARIN (PORCINE) IN NACL 2000-0.9 UNIT/L-% IV SOLN
INTRAVENOUS | Status: DC | PRN
  Administered 2021-09-26 (×2): 1000 mL

## 2021-09-26 MED ORDER — NITROGLYCERIN 1 MG/10 ML FOR IR/CATH LAB
INTRA_ARTERIAL | Status: DC | PRN
  Administered 2021-09-26: 100 ug via INTRACORONARY

## 2021-09-26 MED ORDER — MIDAZOLAM HCL 2 MG/2ML IJ SOLN
INTRAMUSCULAR | Status: AC
  Filled 2021-09-26: qty 2

## 2021-09-26 MED ORDER — VERAPAMIL HCL 2.5 MG/ML IV SOLN
INTRAVENOUS | Status: AC
  Filled 2021-09-26: qty 2

## 2021-09-26 MED ORDER — SODIUM CHLORIDE 0.9 % WEIGHT BASED INFUSION: 1.0000 mL/kg/h | INTRAVENOUS | Status: DC

## 2021-09-26 MED ORDER — LIDOCAINE HCL (PF) 1 % IJ SOLN
INTRAMUSCULAR | Status: DC | PRN
  Administered 2021-09-26: 5 mL

## 2021-09-26 MED ORDER — SODIUM CHLORIDE 0.9 % WEIGHT BASED INFUSION
3.0000 mL/kg/h | INTRAVENOUS | Status: AC
  Administered 2021-09-26: 3 mL/kg/h via INTRAVENOUS

## 2021-09-26 MED ORDER — IOHEXOL 350 MG/ML SOLN
INTRAVENOUS | Status: DC | PRN
  Administered 2021-09-26: 160 mL

## 2021-09-26 MED ORDER — NITROGLYCERIN 2 % TD OINT
1.0000 [in_us] | TOPICAL_OINTMENT | Freq: Once | TRANSDERMAL | Status: DC
  Filled 2021-09-26: qty 1

## 2021-09-26 MED ORDER — NAPHAZOLINE-GLYCERIN 0.012-0.25 % OP SOLN
1.0000 [drp] | Freq: Four times a day (QID) | OPHTHALMIC | Status: DC | PRN
  Administered 2021-09-26: 2 [drp] via OPHTHALMIC
  Filled 2021-09-26: qty 15

## 2021-09-26 MED ORDER — SODIUM CHLORIDE 0.9% FLUSH: 3.0000 mL | Freq: Two times a day (BID) | INTRAVENOUS | Status: DC

## 2021-09-26 MED ORDER — METOPROLOL SUCCINATE ER 25 MG PO TB24
25.0000 mg | ORAL_TABLET | Freq: Every day | ORAL | Status: DC
  Filled 2021-09-26: qty 1

## 2021-09-26 MED ORDER — NITROGLYCERIN 0.4 MG SL SUBL
0.4000 mg | SUBLINGUAL_TABLET | SUBLINGUAL | 2 refills | Status: AC | PRN
  Filled 2021-09-26: qty 30, 8d supply, fill #0

## 2021-09-26 MED ORDER — ACETAMINOPHEN 325 MG PO TABS: 650.0000 mg | ORAL_TABLET | ORAL | Status: DC | PRN

## 2021-09-26 MED ORDER — NITROGLYCERIN 1 MG/10 ML FOR IR/CATH LAB
INTRA_ARTERIAL | Status: AC
  Filled 2021-09-26: qty 10

## 2021-09-26 MED ORDER — FENTANYL CITRATE (PF) 100 MCG/2ML IJ SOLN
INTRAMUSCULAR | Status: DC | PRN
  Administered 2021-09-26: 25 ug via INTRAVENOUS

## 2021-09-26 MED ORDER — VIPERSLIDE LUBRICANT OPTIME
TOPICAL | Status: DC | PRN
  Administered 2021-09-26: 20 mL via SURGICAL_CAVITY

## 2021-09-26 MED ORDER — CLOPIDOGREL BISULFATE 75 MG PO TABS: 600.0000 mg | ORAL_TABLET | Freq: Once | ORAL | Status: DC

## 2021-09-26 MED ORDER — HEPARIN SODIUM (PORCINE) 1000 UNIT/ML IJ SOLN
INTRAMUSCULAR | Status: AC
  Filled 2021-09-26: qty 10

## 2021-09-26 MED ORDER — VERAPAMIL HCL 2.5 MG/ML IV SOLN
INTRAVENOUS | Status: DC | PRN
  Administered 2021-09-26: 10 mL via INTRA_ARTERIAL

## 2021-09-26 MED ORDER — CLOPIDOGREL BISULFATE 75 MG PO TABS
75.0000 mg | ORAL_TABLET | Freq: Every day | ORAL | 2 refills | Status: DC
  Filled 2021-09-26: qty 30, 30d supply, fill #0

## 2021-09-26 MED ORDER — ASPIRIN 81 MG PO CHEW: 81.0000 mg | CHEWABLE_TABLET | ORAL | Status: AC

## 2021-09-26 MED ORDER — FENTANYL CITRATE (PF) 100 MCG/2ML IJ SOLN
INTRAMUSCULAR | Status: AC
  Filled 2021-09-26: qty 2

## 2021-09-26 MED ORDER — SODIUM CHLORIDE 0.9% FLUSH
3.0000 mL | Freq: Two times a day (BID) | INTRAVENOUS | Status: DC
  Administered 2021-09-27: 3 mL via INTRAVENOUS

## 2021-09-26 MED ORDER — CLOPIDOGREL BISULFATE 75 MG PO TABS
75.0000 mg | ORAL_TABLET | Freq: Every day | ORAL | Status: DC
  Administered 2021-09-27: 75 mg via ORAL
  Filled 2021-09-26: qty 1

## 2021-09-26 MED ORDER — HYDRALAZINE HCL 20 MG/ML IJ SOLN: 10.0000 mg | INTRAMUSCULAR | Status: AC | PRN

## 2021-09-26 MED ORDER — HEPARIN SODIUM (PORCINE) 1000 UNIT/ML IJ SOLN
INTRAMUSCULAR | Status: DC | PRN
  Administered 2021-09-26 (×2): 3000 [IU] via INTRAVENOUS
  Administered 2021-09-26: 9000 [IU] via INTRAVENOUS
  Administered 2021-09-26: 3000 [IU] via INTRAVENOUS

## 2021-09-26 MED ORDER — NITROGLYCERIN 2 % TD OINT
TOPICAL_OINTMENT | TRANSDERMAL | Status: DC | PRN
  Administered 2021-09-26: .5 [in_us] via TOPICAL

## 2021-09-26 MED ORDER — LIDOCAINE HCL (PF) 1 % IJ SOLN
INTRAMUSCULAR | Status: AC
  Filled 2021-09-26: qty 30

## 2021-09-26 MED ORDER — SODIUM CHLORIDE 0.9 % IV SOLN: 250.0000 mL | INTRAVENOUS | Status: DC | PRN

## 2021-09-26 MED ORDER — SODIUM CHLORIDE 0.9 % IV SOLN: INTRAVENOUS | Status: AC

## 2021-09-26 MED ORDER — CLOPIDOGREL BISULFATE 300 MG PO TABS
600.0000 mg | ORAL_TABLET | Freq: Every day | ORAL | Status: DC
  Administered 2021-09-26: 600 mg via ORAL
  Filled 2021-09-26 (×2): qty 2

## 2021-09-26 MED ORDER — LABETALOL HCL 5 MG/ML IV SOLN: 10.0000 mg | INTRAVENOUS | Status: AC | PRN

## 2021-09-26 MED ORDER — ONDANSETRON HCL 4 MG/2ML IJ SOLN: 4.0000 mg | Freq: Four times a day (QID) | INTRAMUSCULAR | Status: DC | PRN

## 2021-09-26 MED ORDER — ROSUVASTATIN CALCIUM 20 MG PO TABS
20.0000 mg | ORAL_TABLET | Freq: Every day | ORAL | 2 refills | Status: DC
Start: 2021-09-27 — End: 2021-10-09
  Filled 2021-09-26: qty 30, 30d supply, fill #0

## 2021-09-26 MED ORDER — FENTANYL CITRATE (PF) 100 MCG/2ML IJ SOLN
INTRAMUSCULAR | Status: DC | PRN
  Administered 2021-09-26 (×3): 25 ug via INTRAVENOUS

## 2021-09-26 MED ORDER — ASPIRIN 81 MG PO TBEC
81.0000 mg | DELAYED_RELEASE_TABLET | Freq: Every day | ORAL | 2 refills | Status: AC
  Filled 2021-09-26: qty 30, 30d supply, fill #0

## 2021-09-26 SURGICAL SUPPLY — 34 items
BALLN SAPPHIRE 1.0X5 (BALLOONS) ×2
BALLN SAPPHIRE 3.0X15 (BALLOONS) ×2
BALLN SAPPHIRE ~~LOC~~ 3.25X12 (BALLOONS) ×1 IMPLANT
BALLN SAPPHIRE ~~LOC~~ 3.5X12 (BALLOONS) ×1 IMPLANT
BALLN SCOREFLEX 2.50X10 (BALLOONS) ×2
BALLN SCOREFLEX 3.0X10 (BALLOONS) ×2
BALLOON SAPPHIRE 1.0X5 (BALLOONS) IMPLANT
BALLOON SAPPHIRE 3.0X15 (BALLOONS) IMPLANT
BALLOON SCOREFLEX 2.50X10 (BALLOONS) IMPLANT
BALLOON SCOREFLEX 3.0X10 (BALLOONS) IMPLANT
CATH CXI 2.3F 150 ANG (CATHETERS) ×1 IMPLANT
CATH DRAGONFLY OPSTAR (CATHETERS) ×1 IMPLANT
CATH SUPERCROSS ANGLED 90 DEG (MICROCATHETER) ×1 IMPLANT
CATH TELEPORT (CATHETERS) ×1 IMPLANT
CATH VISTA GUIDE 6FR XBLAD3.5 (CATHETERS) ×1 IMPLANT
CROWN DIAMONDBACK CLASSIC 1.25 (BURR) ×1 IMPLANT
DEVICE RAD COMP TR BAND LRG (VASCULAR PRODUCTS) ×2 IMPLANT
GLIDESHEATH SLEND A-KIT 6F 22G (SHEATH) ×1 IMPLANT
GUIDEWIRE INQWIRE 1.5J.035X260 (WIRE) IMPLANT
INQWIRE 1.5J .035X260CM (WIRE) ×2
KIT ENCORE 26 ADVANTAGE (KITS) ×1 IMPLANT
KIT HEART LEFT (KITS) ×3 IMPLANT
KIT HEMO VALVE WATCHDOG (MISCELLANEOUS) ×1 IMPLANT
LUBRICANT VIPERSLIDE CORONARY (MISCELLANEOUS) ×1 IMPLANT
PACK CARDIAC CATHETERIZATION (CUSTOM PROCEDURE TRAY) ×3 IMPLANT
STENT ONYX FRONTIER 3.0X15 (Permanent Stent) ×2 IMPLANT
STENT ONYX FRONTIER 3.0X22 (Permanent Stent) ×1 IMPLANT
TRANSDUCER W/STOPCOCK (MISCELLANEOUS) ×3 IMPLANT
TUBING CIL FLEX 10 FLL-RA (TUBING) ×3 IMPLANT
WIRE ASAHI PROWATER 180CM (WIRE) ×1 IMPLANT
WIRE ASAHI PROWATER 300CM (WIRE) ×1 IMPLANT
WIRE HI TORQ WHISPER MS 300CM (WIRE) ×1 IMPLANT
WIRE MAILMAN 182CM (WIRE) ×2 IMPLANT
WIRE VIPERWIRE COR FLEX .012 (WIRE) ×1 IMPLANT

## 2021-09-26 NOTE — Progress Notes (Signed)
ANTICOAGULATION CONSULT NOTE  Pharmacy Consult for Heparin Indication: CAD  No Known Allergies  Patient Measurements: Height: 6' (182.9 cm) Weight: 96.5 kg (212 lb 11.9 oz) IBW/kg (Calculated) : 77.6 Heparin Dosing Weight: 95.3 kg  Vital Signs: Temp: 97.4 F (36.3 C) (12/15 0002) Temp Source: Oral (12/15 0002) BP: 112/70 (12/15 0002) Pulse Rate: 61 (12/15 0002)  Labs: Recent Labs    09/24/21 1438 09/24/21 1635 09/24/21 2038 09/25/21 0142 09/25/21 0334 09/25/21 0818 09/26/21 0115  HGB 14.5  --   --   --  14.0  --  13.0  HCT 43.7  --   --   --  42.0  --  37.7*  PLT 167  --   --   --  146*  --  144*  LABPROT  --   --  13.9  --   --   --   --   INR  --   --  1.1  --   --   --   --   HEPARINUNFRC  --   --   --  0.57  --  0.57 <0.10*  CREATININE 1.06  --   --   --  0.98  --   --   CKTOTAL  --  66  --   --   --   --   --   CKMB  --  1.1  --   --   --   --   --   TROPONINIHS 5 6  --   --   --   --   --      Estimated Creatinine Clearance: 66.4 mL/min (by C-G formula based on SCr of 0.98 mg/dL).   Assessment: 87 y.o. male with CAR s/p cath, awaiting possible CABG, for heparin  Goal of Therapy:  Heparin level 0.3-0.7 units/ml Monitor platelets by anticoagulation protocol: Yes   Plan:  As per Dr. Bonney Roussel cath note, restart heparin 1200 units/hr Check heparin level in 8 hours.  Phillis Knack, PharmD, BCPS   09/26/2021, 3:26 AM

## 2021-09-26 NOTE — Interval H&P Note (Signed)
History and Physical Interval Note:  09/26/2021 1:31 PM  Jamie Byrd  has presented today for surgery, with the diagnosis of cad.  The various methods of treatment have been discussed with the patient and family. After consideration of risks, benefits and other options for treatment, the patient has consented to  Procedure(s): CORONARY STENT INTERVENTION (N/A) as a surgical intervention.  The patient's history has been reviewed, patient examined, no change in status, stable for surgery.  I have reviewed the patient's chart and labs.  Questions were answered to the patient's satisfaction.    2016 Appropriate Use Criteria for Coronary Revascularization in Patients With Acute Coronary Syndrome NSTEMI/Unstable angina, stabilized patient at Intermediate Risk (TIMI Score 3-4) Indication:  Revascularization by PCI or CABG of 1 or more arteries in a patient with NSTEMI or unstable angina with Stabilization after presentation Intermediate risk for clinical events A (7) Indication: 16; Score 7    [place]

## 2021-09-26 NOTE — Consult Note (Signed)
Subjective:  No chest pain at rest  Patient absolutely clear that he does not want CABG. Worried about his wife, states he is too old for surgery.   Objective:  Vital Signs in the last 24 hours: Temp:  [97.4 F (36.3 C)-97.7 F (36.5 C)] 97.6 F (36.4 C) (12/15 0330) Pulse Rate:  [53-78] 53 (12/15 0330) Resp:  [11-22] 16 (12/15 0330) BP: (111-168)/(59-90) 111/59 (12/15 0330) SpO2:  [87 %-100 %] 98 % (12/15 0330) Weight:  [96.5 kg] 96.5 kg (12/14 1724)  Intake/Output from previous day: 12/14 0701 - 12/15 0700 In: 600.9 [P.O.:120; I.V.:480.9] Out: 950 [Urine:950]  Physical Exam Vitals and nursing note reviewed.  Constitutional:      General: He is not in acute distress.    Appearance: He is well-developed.  HENT:     Head: Normocephalic and atraumatic.  Eyes:     Conjunctiva/sclera: Conjunctivae normal.     Pupils: Pupils are equal, round, and reactive to light.  Neck:     Vascular: No JVD.  Cardiovascular:     Rate and Rhythm: Normal rate and regular rhythm.     Pulses: Normal pulses and intact distal pulses.     Heart sounds: No murmur heard. Pulmonary:     Effort: Pulmonary effort is normal.     Breath sounds: Normal breath sounds. No wheezing or rales.  Abdominal:     General: Bowel sounds are normal.     Palpations: Abdomen is soft.     Tenderness: There is no rebound.  Musculoskeletal:        General: No tenderness. Normal range of motion.     Right lower leg: No edema.     Left lower leg: No edema.  Lymphadenopathy:     Cervical: No cervical adenopathy.  Skin:    General: Skin is warm and dry.  Neurological:     Mental Status: He is alert and oriented to person, place, and time.     Cranial Nerves: No cranial nerve deficit.     Lab Results: BMP Recent Labs    05/23/21 1001 09/24/21 1438 09/25/21 0334  NA 136 136 133*  K 4.6 4.1 3.9  CL 102 102 101  CO2 30 25 25   GLUCOSE 82 92 94  BUN 11 12 11   CREATININE 1.02 1.06 0.98  CALCIUM 9.8 9.9 9.3   GFRNONAA  --  >60 >60    CBC Recent Labs  Lab 09/26/21 0115  WBC 6.1  RBC 3.88*  HGB 13.0  HCT 37.7*  PLT 144*  MCV 97.2  MCH 33.5  MCHC 34.5  RDW 13.5    HEMOGLOBIN A1C Lab Results  Component Value Date   HGBA1C 5.3 05/23/2021    Cardiac Panel (last 3 results) Recent Labs    09/24/21 1635  CKTOTAL 66  CKMB 1.1  RELINDX RELATIVE INDEX IS INVALID    BNP (last 3 results) Recent Labs    09/26/21 0657  BNP 92.5    TSH Recent Labs    05/23/21 1001  TSH 5.52*    Lipid Panel     Component Value Date/Time   CHOL 117 09/25/2021 0334   TRIG 58 09/25/2021 0334   HDL 33 (L) 09/25/2021 0334   CHOLHDL 3.5 09/25/2021 0334   VLDL 12 09/25/2021 0334   LDLCALC 72 09/25/2021 0334     Hepatic Function Panel Recent Labs    05/23/21 1001 09/25/21 0334  PROT 6.7 6.0*  ALBUMIN 4.3 3.7  AST 19 25  ALT  10 15  ALKPHOS 105 98  BILITOT 0.8 1.2    Imaging: CXR 09/24/2021: No acute intrathoracic process.    Cardiac Studies:  EKG 09/24/2021: Sinus rhythm Anteroseptal infarct, age indeterminate  Coronary angiogram 09/25/2021: LM: Normal LAD: Ostial 99% stenosis with severe calcification Ramus: Mid 80% eccentric stenosis with severe calcification Lcx: Small OM1 with 50% disease        Large OM1 with mid 30% disease RCA: Prox 50% stenosis with severe calcification   LVEDP normal LVEF normal on echocardiogram   Given location of the ostial LAD lesion with severe calcification with a large ramus arising at an acute angle with respect to LAD.  Will consult CVTS for possible CABG to LAD and ramus Limiting factors could be age and aorta calcification. 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 He does not have living children, but has brothers and sisters in their 22s. I spoke with his sister Geralyn Corwin [phone])   If not a surgical candidate, could consider high risk PCI ostial LAD, along with PCI to ramus    Echocardiogram 09/25/2021:  1. Left ventricular ejection fraction, by estimation, is 50 to 55%. The  left ventricle has low normal function. The left ventricle demonstrates  regional wall motion abnormalities (see scoring diagram/findings for  description). Left ventricular diastolic   parameters were normal.   2. Right ventricular systolic function is normal. The right ventricular  size is normal.   3. The mitral valve is normal in structure. No evidence of mitral valve  regurgitation. No evidence of mitral stenosis.   4. The aortic valve is tricuspid. Aortic valve regurgitation is not  visualized. No aortic stenosis is present.   5. There is mild dilatation of the aortic root, measuring 43 mm.   Assessment & Recommendations:  87 year old Caucasian male with hypertension, hypothyroidism, prediabetes, former smoker, admitted with unstable angina  CAD: High grade 99% ostial LAD lesion. Another 80% lesion in ramus/high diag. Given the complexity, location of the lesion, and severity of his accelerated angina symptoms, I recommended CVTS consult.  However, patient is absolutely clear this morning, that he does not want to undergo bypass surgery. While I have started him on medical therapy with metoprolol succinate 25 mg daily, given the severity and accelerated nature of his anginal symptoms, I am concerned about his risk of major event without revascularization. I offered him high risk PCI as an alternate option to bypass surgery.  I will likely need to perform atherectomy prior to stenting. I discussed benefits and risks of the procedure, including but not limited to bleeding, infection, renal failure, coronary dissection, perforation, acute MI, cardiac arrest, death.  The risks are about 3-4%.  Patient discussed with his Sister Horris Latino, and wants to proceed with high risk PCI today. Start Plavix with 600 mg loading dose, 75 mg daily from tomorrow. Keep NPO.      Nigel Mormon, MD Pager: [phone] Office: [phone]

## 2021-09-26 NOTE — CV Procedure (Addendum)
Atherectomy and complex 2 vessel PCI (LAD and high diag) with OCT guidance Full report to follow Patient stable  Patient developed grade ! Hematoma at access site requiring proximal second TR band. He still has 2+ radial pulse and Doppler + ulnar pulse, but had bluish discoloration of his hand with reduced capillary refill. I suspected distal spasm due to the hematoma. This improved after local application of nitro paste and use of hand warmer. Pleth improved to >90%. Both his hands are cool with low pleth, suggesting that some of the poor waveform is due to hands being cold.  With the above measures, there is no further growth of hematoma, radial pulse is 2+, hand is looking better with good capillary refill.  Release air from both TR bands simultaneously, as you would for one TR band.    Nigel Mormon, MD Pager: [phone] Office: [phone]

## 2021-09-26 NOTE — Plan of Care (Signed)
Vitals stable. TR band removed per protocol with no issues. Nitro drip kept at lowest rate with no titration needed. Heparin drip restarted this shift.    Problem: Education: Goal: Knowledge of General Education information will improve Description: Including pain rating scale, medication(s)/side effects and non-pharmacologic comfort measures Outcome: Progressing   Problem: Health Behavior/Discharge Planning: Goal: Ability to manage health-related needs will improve Outcome: Progressing   Problem: Clinical Measurements: Goal: Ability to maintain clinical measurements within normal limits will improve Outcome: Progressing Goal: Will remain free from infection Outcome: Progressing Goal: Diagnostic test results will improve Outcome: Progressing Goal: Respiratory complications will improve Outcome: Progressing Goal: Cardiovascular complication will be avoided Outcome: Progressing   Problem: Activity: Goal: Risk for activity intolerance will decrease Outcome: Progressing   Problem: Nutrition: Goal: Adequate nutrition will be maintained Outcome: Progressing   Problem: Coping: Goal: Level of anxiety will decrease Outcome: Progressing   Problem: Elimination: Goal: Will not experience complications related to bowel motility Outcome: Progressing Goal: Will not experience complications related to urinary retention Outcome: Progressing   Problem: Pain Managment: Goal: General experience of comfort will improve Outcome: Progressing   Problem: Safety: Goal: Ability to remain free from injury will improve Outcome: Progressing   Problem: Skin Integrity: Goal: Risk for impaired skin integrity will decrease Outcome: Progressing

## 2021-09-26 NOTE — Progress Notes (Signed)
ANTICOAGULATION CONSULT NOTE  Pharmacy Consult for Heparin Indication: CAD  No Known Allergies  Patient Measurements: Height: 6' (182.9 cm) Weight: 96.5 kg (212 lb 11.9 oz) IBW/kg (Calculated) : 77.6 Heparin Dosing Weight: 95.3 kg  Vital Signs: Temp: 97.6 F (36.4 C) (12/15 0330) Temp Source: Oral (12/15 0330) BP: 102/71 (12/15 1054) Pulse Rate: 53 (12/15 0330)  Labs: Recent Labs    09/24/21 1438 09/24/21 1635 09/24/21 2038 09/25/21 0142 09/25/21 0334 09/25/21 0818 09/26/21 0115  HGB 14.5  --   --   --  14.0  --  13.0  HCT 43.7  --   --   --  42.0  --  37.7*  PLT 167  --   --   --  146*  --  144*  LABPROT  --   --  13.9  --   --   --   --   INR  --   --  1.1  --   --   --   --   HEPARINUNFRC  --   --   --  0.57  --  0.57 <0.10*  CREATININE 1.06  --   --   --  0.98  --   --   CKTOTAL  --  66  --   --   --   --   --   CKMB  --  1.1  --   --   --   --   --   TROPONINIHS 5 6  --   --   --   --   --      Estimated Creatinine Clearance: 66.4 mL/min (by C-G formula based on SCr of 0.98 mg/dL).   Assessment: 87 y.o. male with CAR s/p cath. Patient declined CABG, taken for PCI this afternoon. Heparin level just below goal prior to cath. Rate not adjusted. Will follow up post cath.   Goal of Therapy:  Heparin level 0.3-0.7 units/ml Monitor platelets by anticoagulation protocol: Yes   Plan:  Follow up plan post cath  Erin Hearing PharmD., BCPS Clinical Pharmacist 09/26/2021 11:21 AM

## 2021-09-26 NOTE — H&P (View-Only) (Signed)
Subjective:  No chest pain at rest  Patient absolutely clear that he does not want CABG. Worried about his wife, states he is too old for surgery.   Objective:  Vital Signs in the last 24 hours: Temp:  [97.4 F (36.3 C)-97.7 F (36.5 C)] 97.6 F (36.4 C) (12/15 0330) Pulse Rate:  [53-78] 53 (12/15 0330) Resp:  [11-22] 16 (12/15 0330) BP: (111-168)/(59-90) 111/59 (12/15 0330) SpO2:  [87 %-100 %] 98 % (12/15 0330) Weight:  [96.5 kg] 96.5 kg (12/14 1724)  Intake/Output from previous day: 12/14 0701 - 12/15 0700 In: 600.9 [P.O.:120; I.V.:480.9] Out: 950 [Urine:950]  Physical Exam Vitals and nursing note reviewed.  Constitutional:      General: He is not in acute distress.    Appearance: He is well-developed.  HENT:     Head: Normocephalic and atraumatic.  Eyes:     Conjunctiva/sclera: Conjunctivae normal.     Pupils: Pupils are equal, round, and reactive to light.  Neck:     Vascular: No JVD.  Cardiovascular:     Rate and Rhythm: Normal rate and regular rhythm.     Pulses: Normal pulses and intact distal pulses.     Heart sounds: No murmur heard. Pulmonary:     Effort: Pulmonary effort is normal.     Breath sounds: Normal breath sounds. No wheezing or rales.  Abdominal:     General: Bowel sounds are normal.     Palpations: Abdomen is soft.     Tenderness: There is no rebound.  Musculoskeletal:        General: No tenderness. Normal range of motion.     Right lower leg: No edema.     Left lower leg: No edema.  Lymphadenopathy:     Cervical: No cervical adenopathy.  Skin:    General: Skin is warm and dry.  Neurological:     Mental Status: He is alert and oriented to person, place, and time.     Cranial Nerves: No cranial nerve deficit.     Lab Results: BMP Recent Labs    05/23/21 1001 09/24/21 1438 09/25/21 0334  NA 136 136 133*  K 4.6 4.1 3.9  CL 102 102 101  CO2 30 25 25   GLUCOSE 82 92 94  BUN 11 12 11   CREATININE 1.02 1.06 0.98  CALCIUM 9.8 9.9 9.3   GFRNONAA  --  >60 >60    CBC Recent Labs  Lab 09/26/21 0115  WBC 6.1  RBC 3.88*  HGB 13.0  HCT 37.7*  PLT 144*  MCV 97.2  MCH 33.5  MCHC 34.5  RDW 13.5    HEMOGLOBIN A1C Lab Results  Component Value Date   HGBA1C 5.3 05/23/2021    Cardiac Panel (last 3 results) Recent Labs    09/24/21 1635  CKTOTAL 66  CKMB 1.1  RELINDX RELATIVE INDEX IS INVALID    BNP (last 3 results) Recent Labs    09/26/21 0657  BNP 92.5    TSH Recent Labs    05/23/21 1001  TSH 5.52*    Lipid Panel     Component Value Date/Time   CHOL 117 09/25/2021 0334   TRIG 58 09/25/2021 0334   HDL 33 (L) 09/25/2021 0334   CHOLHDL 3.5 09/25/2021 0334   VLDL 12 09/25/2021 0334   LDLCALC 72 09/25/2021 0334     Hepatic Function Panel Recent Labs    05/23/21 1001 09/25/21 0334  PROT 6.7 6.0*  ALBUMIN 4.3 3.7  AST 19 25  ALT  10 15  ALKPHOS 105 98  BILITOT 0.8 1.2    Imaging: CXR 09/24/2021: No acute intrathoracic process.    Cardiac Studies:  EKG 09/24/2021: Sinus rhythm Anteroseptal infarct, age indeterminate  Coronary angiogram 09/25/2021: LM: Normal LAD: Ostial 99% stenosis with severe calcification Ramus: Mid 80% eccentric stenosis with severe calcification Lcx: Small OM1 with 50% disease        Large OM1 with mid 30% disease RCA: Prox 50% stenosis with severe calcification   LVEDP normal LVEF normal on echocardiogram   Given location of the ostial LAD lesion with severe calcification with a large ramus arising at an acute angle with respect to LAD.  Will consult CVTS for possible CABG to LAD and ramus Limiting factors could be age and aorta calcification. Also, patient is the primary caretaker to his 59 y/o wife with dementia. He does not have living children, but has brothers and sisters in their 21s. I spoke with his sister Geralyn Corwin [phone])   If not a surgical candidate, could consider high risk PCI ostial LAD, along with PCI to ramus    Echocardiogram 09/25/2021:  1. Left ventricular ejection fraction, by estimation, is 50 to 55%. The  left ventricle has low normal function. The left ventricle demonstrates  regional wall motion abnormalities (see scoring diagram/findings for  description). Left ventricular diastolic   parameters were normal.   2. Right ventricular systolic function is normal. The right ventricular  size is normal.   3. The mitral valve is normal in structure. No evidence of mitral valve  regurgitation. No evidence of mitral stenosis.   4. The aortic valve is tricuspid. Aortic valve regurgitation is not  visualized. No aortic stenosis is present.   5. There is mild dilatation of the aortic root, measuring 43 mm.   Assessment & Recommendations:  87 year old Caucasian male with hypertension, hypothyroidism, prediabetes, former smoker, admitted with unstable angina  CAD: High grade 99% ostial LAD lesion. Another 80% lesion in ramus/high diag. Given the complexity, location of the lesion, and severity of his accelerated angina symptoms, I recommended CVTS consult.  However, patient is absolutely clear this morning, that he does not want to undergo bypass surgery. While I have started him on medical therapy with metoprolol succinate 25 mg daily, given the severity and accelerated nature of his anginal symptoms, I am concerned about his risk of major event without revascularization. I offered him high risk PCI as an alternate option to bypass surgery.  I will likely need to perform atherectomy prior to stenting. I discussed benefits and risks of the procedure, including but not limited to bleeding, infection, renal failure, coronary dissection, perforation, acute MI, cardiac arrest, death.  The risks are about 3-4%.  Patient discussed with his Sister Horris Latino, and wants to proceed with high risk PCI today. Start Plavix with 600 mg loading dose, 75 mg daily from tomorrow. Keep NPO.      Nigel Mormon, MD Pager: [phone] Office: [phone]

## 2021-09-26 NOTE — Progress Notes (Signed)
PROGRESS NOTE    Jamie Byrd  PFX:[id number] DOB: 1933/11/26 DOA: 09/24/2021 PCP: Abner Greenspan, MD  Chief Complaint  Patient presents with   Chest Pain    Brief Narrative:   Jamie Byrd is a 87 y.o. male with medical history significant for hypertension, hypothyroidism, prediabetes, no known CAD presents to the ER for evaluation of chest pain, described as chest tightness brought on by exertion and resolves with rest, gradually worsening.. he was admitted for unstable angina.   Assessment & Plan:   Principal Problem:   Chest pain Active Problems:   Hypothyroidism   Essential hypertension   Benign prostatic hyperplasia   Prediabetes  Unstable angina:  Underwent cardiac cath showing 99% ostial LAD Lesion, 80% lesion in ramus / high diag, . Cardiology recommended CVTS consult, but pt is adamant and refusing the bypass.  Plan for revascularization with right risk PCI/ atherectomy prior to stenting. Meanwhile medical management with aspirin, plavix and metoprolol 12.5 mg BID, Crestor 20 mg daily.  Echocardiogram shows LVEF of 50 to 55%, shows low normal function, diastolic parameters were normal. Hypokinesis of proximal to distal anteroseptal wall. Chest pain appears to have much improved.     Hypertension:  BP parameters are optimal  Resume the home meds.    Hypothyroidism:  Resume synthroid.    Hyperlipidemia:  Resume statin.      DVT prophylaxis: Heparin Code Status: (Full Code) Family Communication: none at bedside.  Disposition:   Status is: Observation  The patient will require care spanning > 2 midnights and should be moved to inpatient because: Left Heart Catheterization.       Consultants:  Cardiology Dr Terri Skains  Procedures: echocardiogram.  Left heart cath on 09/25/21  Antimicrobials: none.    Subjective: No new complaints.   Objective: Vitals:   09/26/21 0002 09/26/21 0330 09/26/21 1054 09/26/21 1125  BP: 112/70 (!) 111/59  102/71   Pulse: 61 (!) 53  86  Resp: 19 16    Temp: (!) 97.4 F (36.3 C) 97.6 F (36.4 C)    TempSrc: Oral Oral    SpO2: 96% 98%    Weight:      Height:        Intake/Output Summary (Last 24 hours) at 09/26/2021 1314 Last data filed at 09/26/2021 1224 Gross per 24 hour  Intake 326.71 ml  Output 900 ml  Net -573.29 ml    Filed Weights   09/24/21 1427 09/25/21 1724  Weight: 95.3 kg 96.5 kg    Examination:  General exam: Appears calm and comfortable  Respiratory system: Clear to auscultation. Respiratory effort normal. Cardiovascular system: S1 & S2 heard, RRR. No JVD, No pedal edema. Gastrointestinal system: Abdomen is nondistended, soft and nontender.  Normal bowel sounds heard. Central nervous system: Alert and oriented. No focal neurological deficits. Extremities: Symmetric 5 x 5 power. Skin: No rashes, lesions or ulcers Psychiatry: Mood & affect appropriate.      Data Reviewed: I have personally reviewed following labs and imaging studies  CBC: Recent Labs  Lab 09/24/21 1438 09/25/21 0334 09/26/21 0115  WBC 6.5 5.3 6.1  HGB 14.5 14.0 13.0  HCT 43.7 42.0 37.7*  MCV 98.9 100.2* 97.2  PLT 167 146* 144*     Basic Metabolic Panel: Recent Labs  Lab 09/24/21 1438 09/25/21 0334  NA 136 133*  K 4.1 3.9  CL 102 101  CO2 25 25  GLUCOSE 92 94  BUN 12 11  CREATININE 1.06 0.98  CALCIUM 9.9  9.3     GFR: Estimated Creatinine Clearance: 66.4 mL/min (by C-G formula based on SCr of 0.98 mg/dL).  Liver Function Tests: Recent Labs  Lab 09/25/21 0334  AST 25  ALT 15  ALKPHOS 98  BILITOT 1.2  PROT 6.0*  ALBUMIN 3.7     CBG: No results for input(s): GLUCAP in the last 168 hours.   Recent Results (from the past 240 hour(s))  Resp Panel by RT-PCR (Flu A&B, Covid) Nasopharyngeal Swab     Status: None   Collection Time: 09/24/21  5:01 PM   Specimen: Nasopharyngeal Swab; Nasopharyngeal(NP) swabs in vial transport medium  Result Value Ref Range  Status   SARS Coronavirus 2 by RT PCR NEGATIVE NEGATIVE Final    Comment: (NOTE) SARS-CoV-2 target nucleic acids are NOT DETECTED.  The SARS-CoV-2 RNA is generally detectable in upper respiratory specimens during the acute phase of infection. The lowest concentration of SARS-CoV-2 viral copies this assay can detect is 138 copies/mL. A negative result does not preclude SARS-Cov-2 infection and should not be used as the sole basis for treatment or other patient management decisions. A negative result may occur with  improper specimen collection/handling, submission of specimen other than nasopharyngeal swab, presence of viral mutation(s) within the areas targeted by this assay, and inadequate number of viral copies(<138 copies/mL). A negative result must be combined with clinical observations, patient history, and epidemiological information. The expected result is Negative.  Fact Sheet for Patients:  EntrepreneurPulse.com.au  Fact Sheet for Healthcare Providers:  IncredibleEmployment.be  This test is no t yet approved or cleared by the [place] FDA and  has been authorized for detection and/or diagnosis of SARS-CoV-2 by FDA under an Emergency Use Authorization (EUA). This EUA will remain  in effect (meaning this test can be used) for the duration of the COVID-19 declaration under Section 564(b)(1) of the Act, 21 U.S.C.section 360bbb-3(b)(1), unless the authorization is terminated  or revoked sooner.       Influenza A by PCR NEGATIVE NEGATIVE Final   Influenza B by PCR NEGATIVE NEGATIVE Final    Comment: (NOTE) The Xpert Xpress SARS-CoV-2/FLU/RSV plus assay is intended as an aid in the diagnosis of influenza from Nasopharyngeal swab specimens and should not be used as a sole basis for treatment. Nasal washings and aspirates are unacceptable for Xpert Xpress SARS-CoV-2/FLU/RSV testing.  Fact Sheet for  Patients: EntrepreneurPulse.com.au  Fact Sheet for Healthcare Providers: IncredibleEmployment.be  This test is not yet approved or cleared by the [place] FDA and has been authorized for detection and/or diagnosis of SARS-CoV-2 by FDA under an Emergency Use Authorization (EUA). This EUA will remain in effect (meaning this test can be used) for the duration of the COVID-19 declaration under Section 564(b)(1) of the Act, 21 U.S.C. section 360bbb-3(b)(1), unless the authorization is terminated or revoked.  Performed at [facility], [place] [address] [place], [place] [address]           Radiology Studies: DG Chest 2 View  Result Date: 09/24/2021 CLINICAL DATA:  Intermittent chest pain for several days EXAM: CHEST - 2 VIEW COMPARISON:  03/15/2019 FINDINGS: Frontal and lateral views of the chest demonstrate an unremarkable cardiac silhouette. No airspace disease, effusion, or pneumothorax. No acute bony abnormalities. IMPRESSION: 1. No acute intrathoracic process. Electronically Signed   By: Randa Ngo M.D.   On: 09/24/2021 15:16   CARDIAC CATHETERIZATION  Result Date: 09/25/2021 Images from the original result were not included. LM: Normal LAD: Ostial 99% stenosis  with severe calcification Ramus: Mid 80% eccentric stenosis with severe calcification Lcx: Small OM1 with 50% disease        Large OM1 with mid 30% disease RCA: Prox 50% stenosis with severe calcification LVEDP normal LVEF normal on echocardiogram Given location of the ostial LAD lesion with severe calcification with a large ramus arising at an acute angle with respect to LAD. Will consult CVTS for possible CABG to LAD and ramus Limiting factors could be age and aorta calcification. Also, patient is the primary caretaker to his 19 y/o wife with dementia. He does not have living children, but has brothers and sisters in their 35s. I spoke with his sister Geralyn Corwin  [phone]) If not a surgical candidate, could consider high risk PCI ostial LAD, along with PCI to ramus Nigel Mormon, MD Pager: [phone] Office: [phone]  ECHOCARDIOGRAM COMPLETE  Result Date: 09/25/2021    ECHOCARDIOGRAM REPORT   Patient Name:   SWAYZE PRIES Date of Exam: 09/25/2021 Medical Rec #:  [id number]        Height:       72.0 in Accession #:    [id number]       Weight:       210.0 lb Date of Birth:  [birth year]        BSA:          2.175 m Patient Age:    74 years         BP:           140/74 mmHg Patient Gender: M                HR:           59 bpm. Exam Location:  Inpatient Procedure: 2D Echo, Cardiac Doppler and Color Doppler Indications:    R07.9* Chest pain, unspecified  History:        Patient has no prior history of Echocardiogram examinations.                 Risk Factors:Hypertension.  Sonographer:    Bernadene Person RDCS Referring Phys: [id number] Rex Kras  Sonographer Comments: No subcostal window. IMPRESSIONS  1. Left ventricular ejection fraction, by estimation, is 50 to 55%. The left ventricle has low normal function. The left ventricle demonstrates regional wall motion abnormalities (see scoring diagram/findings for description). Left ventricular diastolic  parameters were normal.  2. Right ventricular systolic function is normal. The right ventricular size is normal.  3. The mitral valve is normal in structure. No evidence of mitral valve regurgitation. No evidence of mitral stenosis.  4. The aortic valve is tricuspid. Aortic valve regurgitation is not visualized. No aortic stenosis is present.  5. There is mild dilatation of the aortic root, measuring 43 mm. Comparison(s): No prior Echocardiogram. FINDINGS  Left Ventricle: Hypokinesis of proximal to distal anteroseptal wall ([email or web address]). Left ventricular ejection fraction, by estimation, is 50 to 55%. The left ventricle has low normal function. The left ventricle demonstrates regional wall motion abnormalities. The  left ventricular internal cavity size was normal in size. There is no left ventricular hypertrophy. Left ventricular diastolic parameters were normal.  LV Wall Scoring: The anterior septum is hypokinetic. Right Ventricle: The right ventricular size is normal. No increase in right ventricular wall thickness. Right ventricular systolic function is normal. Left Atrium: Left atrial size was normal in size. Right Atrium: Right atrial size was normal in size. Pericardium: There is no evidence of pericardial effusion. Mitral Valve: The mitral valve is  normal in structure. No evidence of mitral valve regurgitation. No evidence of mitral valve stenosis. Tricuspid Valve: The tricuspid valve is normal in structure. Tricuspid valve regurgitation is not demonstrated. No evidence of tricuspid stenosis. Aortic Valve: The aortic valve is tricuspid. Aortic valve regurgitation is not visualized. No aortic stenosis is present. Pulmonic Valve: The pulmonic valve was not well visualized. Pulmonic valve regurgitation is not visualized. No evidence of pulmonic stenosis. Aorta: The ascending aorta was not well visualized. There is mild dilatation of the aortic root, measuring 43 mm. IAS/Shunts: The interatrial septum was not assessed.  LEFT VENTRICLE PLAX 2D LVIDd:         4.00 cm      Diastology LVIDs:         2.90 cm      LV e' medial:    7.10 cm/s LV PW:         1.10 cm      LV E/e' medial:  12.1 LV IVS:        1.40 cm      LV e' lateral:   9.02 cm/s LVOT diam:     2.20 cm      LV E/e' lateral: 9.5 LV SV:         82 LV SV Index:   38 LVOT Area:     3.80 cm  LV Volumes (MOD) LV vol d, MOD A2C: 109.0 ml LV vol d, MOD A4C: 133.0 ml LV vol s, MOD A2C: 44.0 ml LV vol s, MOD A4C: 64.2 ml LV SV MOD A2C:     65.0 ml LV SV MOD A4C:     133.0 ml LV SV MOD BP:      67.1 ml RIGHT VENTRICLE RV S prime:     11.70 cm/s TAPSE (M-mode): 1.8 cm LEFT ATRIUM             Index        RIGHT ATRIUM           Index LA diam:        4.40 cm 2.02 cm/m   RA  Area:     11.50 cm LA Vol (A2C):   35.1 ml 16.14 ml/m  RA Volume:   19.20 ml  8.83 ml/m LA Vol (A4C):   33.7 ml 15.49 ml/m LA Biplane Vol: 35.5 ml 16.32 ml/m  AORTIC VALVE LVOT Vmax:   97.50 cm/s LVOT Vmean:  61.300 cm/s LVOT VTI:    0.215 m  AORTA Ao Root diam: 4.30 cm Ao Asc diam:  3.80 cm MITRAL VALVE MV Area (PHT): 2.48 cm     SHUNTS MV Decel Time: 306 msec     Systemic VTI:  0.22 m MV E velocity: 85.90 cm/s   Systemic Diam: 2.20 cm MV A velocity: 108.00 cm/s MV E/A ratio:  0.80 Sunit Tolia DO Electronically signed by Rex Kras DO Signature Date/Time: 09/25/2021/4:01:39 PM    Final         Scheduled Meds:  allopurinol  100 mg Oral Daily   aspirin EC  81 mg Oral Daily   clopidogrel  600 mg Oral Daily   [START ON 09/27/2021] clopidogrel  75 mg Oral Daily   levothyroxine  88 mcg Oral Daily   metoprolol tartrate  12.5 mg Oral BID   rosuvastatin  20 mg Oral Daily   sodium chloride flush  3 mL Intravenous Q12H   sodium chloride flush  3 mL Intravenous Q12H   sodium chloride flush  3 mL  Intravenous Q12H   Continuous Infusions:  sodium chloride     sodium chloride     sodium chloride     heparin 1,200 Units/hr (09/26/21 0346)   nitroGLYCERIN 5 mcg/min (09/25/21 1838)     LOS: 1 day       Hosie Poisson, MD [facility]   To contact the attending provider between 7A-7P or the covering provider during after hours 7P-7A, please log into the web site [email or web address] and access using universal [place] password for that web site. If you do not have the password, please call the hospital operator.  09/26/2021, 1:14 PM

## 2021-09-27 ENCOUNTER — Other Ambulatory Visit (HOSPITAL_COMMUNITY): Payer: Self-pay

## 2021-09-27 ENCOUNTER — Encounter (HOSPITAL_COMMUNITY): Payer: Self-pay | Admitting: Cardiology

## 2021-09-27 ENCOUNTER — Telehealth: Payer: Self-pay

## 2021-09-27 DIAGNOSIS — E039 Hypothyroidism, unspecified: Secondary | ICD-10-CM

## 2021-09-27 LAB — CBC
HCT: 35.4 % — ABNORMAL LOW (ref 39.0–52.0)
Hemoglobin: 11.9 g/dL — ABNORMAL LOW (ref 13.0–17.0)
MCH: 32.8 pg (ref 26.0–34.0)
MCHC: 33.6 g/dL (ref 30.0–36.0)
MCV: 97.5 fL (ref 80.0–100.0)
Platelets: 133 10*3/uL — ABNORMAL LOW (ref 150–400)
RBC: 3.63 MIL/uL — ABNORMAL LOW (ref 4.22–5.81)
RDW: 13.5 % (ref 11.5–15.5)
WBC: 6.3 10*3/uL (ref 4.0–10.5)
nRBC: 0 % (ref 0.0–0.2)

## 2021-09-27 LAB — BASIC METABOLIC PANEL
Anion gap: 6 (ref 5–15)
BUN: 16 mg/dL (ref 8–23)
CO2: 24 mmol/L (ref 22–32)
Calcium: 8.9 mg/dL (ref 8.9–10.3)
Chloride: 104 mmol/L (ref 98–111)
Creatinine, Ser: 1.04 mg/dL (ref 0.61–1.24)
GFR, Estimated: >60 mL/min (ref >60)
Glucose, Bld: 103 mg/dL — ABNORMAL HIGH (ref 70–99)
Potassium: 4 mmol/L (ref 3.5–5.1)
Sodium: 134 mmol/L — ABNORMAL LOW (ref 135–145)

## 2021-09-27 MED ORDER — LISINOPRIL 20 MG PO TABS: 20.0000 mg | ORAL_TABLET | Freq: Every day | ORAL | 2 refills | Status: DC

## 2021-09-27 MED ORDER — LISINOPRIL 20 MG PO TABS
20.0000 mg | ORAL_TABLET | Freq: Every day | ORAL | Status: DC
  Administered 2021-09-27: 20 mg via ORAL
  Filled 2021-09-27: qty 1

## 2021-09-27 NOTE — Discharge Summary (Signed)
Physician Discharge Summary  Jamie Byrd JIR:[id number] DOB: [birth year] DOA: 09/24/2021  PCP: Abner Greenspan, MD  Admit date: 09/24/2021 Discharge date: 09/27/2021  Admitted From: Home Disposition:  Home  Recommendations for Outpatient Follow-up:  Follow up with PCP in 1-2 weeks Please obtain BMP/CBC in one week Please follow up with cardiology as recommended.    Discharge Condition: stable.  CODE STATUS: Full code.  Diet recommendation: Heart Healthy   Brief/Interim Summary: Jamie Byrd is a 87 y.o. male with medical history significant for hypertension, hypothyroidism, prediabetes, no known CAD presents to the ER for evaluation of chest pain, described as chest tightness brought on by exertion and resolves with rest, gradually worsening.. he was admitted for unstable angina.   Discharge Diagnoses:  Principal Problem:   Chest pain Active Problems:   Hypothyroidism   Essential hypertension   Benign prostatic hyperplasia   Prediabetes  Unstable angina:  Underwent cardiac cath showing 99% ostial LAD Lesion, 80% lesion in ramus / high diag, . Cardiology recommended CVTS consult, but pt is adamant and refusing the bypass.  Patient underwent revascularization with right risk PCI/ atherectomy prior to stenting. S/p successful atherectomy and OCT guided complex PCI (09/26/2021) after patient refused to consider CABG.  Meanwhile medical management with aspirin, plavix and  Crestor 20 mg daily.  Echocardiogram shows LVEF of 50 to 55%, shows low normal function, diastolic parameters were normal. Hypokinesis of proximal to distal anteroseptal wall. Chest pain resolved.  Cardiac rehabilitation on discharge.        Hypertension:  BP parameters are borderline low, decrease the dose of lisinopril to 20 mg daily ondischarge.      Hypothyroidism:  Resume synthroid.      Hyperlipidemia:  Resume statin.    Hematoma of the right wrist,  Much improved. Pain controlled.    Discharge Instructions  Discharge Instructions     AMB Referral to Cardiac Rehabilitation - Phase II   Complete by: As directed    Unstable angina   Diagnosis:  Coronary Stents Other     After initial evaluation and assessments completed: Virtual Based Care may be provided alone or in conjunction with Phase 2 Cardiac Rehab based on patient barriers.: Yes   Diet - low sodium heart healthy   Complete by: As directed    Discharge instructions   Complete by: As directed    Please follow up with cardiology as recommended.   Increase activity slowly   Complete by: As directed       Allergies as of 09/27/2021   No Known Allergies      Medication List     STOP taking these medications    mupirocin ointment 2 % Commonly known as: BACTROBAN   PRESERVISION AREDS 2 PO       TAKE these medications    allopurinol 100 MG tablet Commonly known as: ZYLOPRIM Take 1 tablet (100 mg total) by mouth daily.   aspirin 81 MG EC tablet Take 1 tablet (81 mg total) by mouth daily. Swallow whole.   clopidogrel 75 MG tablet Commonly known as: PLAVIX Take 1 tablet (75 mg total) by mouth daily.   levothyroxine 88 MCG tablet Commonly known as: SYNTHROID Take 1 tablet (88 mcg total) by mouth daily.   lisinopril 20 MG tablet Commonly known as: ZESTRIL Take 1 tablet (20 mg total) by mouth daily. What changed:  medication strength how much to take   nitroGLYCERIN 0.4 MG SL tablet Commonly known as: NITROSTAT Place 1 tablet (  0.4 mg total) under the tongue every 5 (five) minutes as needed for chest pain.   rosuvastatin 20 MG tablet Commonly known as: CRESTOR Take 1 tablet (20 mg total) by mouth daily.        Follow-up Information     Alethia Berthold, PA-C Follow up on 10/09/2021.   Specialty: Cardiology Why: 10am Contact information: [place] [facility] [address] [phone]         Abner Greenspan, MD. Schedule an appointment as soon as  possible for a visit in 1 week(s).   Specialties: Family Medicine, Radiology Contact information: [address] [place] [address] [phone]                No Known Allergies  Consultations: cardiology   Procedures/Studies: DG Chest 2 View  Result Date: 09/24/2021 CLINICAL DATA:  Intermittent chest pain for several days EXAM: CHEST - 2 VIEW COMPARISON:  03/15/2019 FINDINGS: Frontal and lateral views of the chest demonstrate an unremarkable cardiac silhouette. No airspace disease, effusion, or pneumothorax. No acute bony abnormalities. IMPRESSION: 1. No acute intrathoracic process. Electronically Signed   By: Randa Ngo M.D.   On: 09/24/2021 15:16   CARDIAC CATHETERIZATION  Result Date: 09/26/2021 Images from the original result were not included. Coronary intervention: 80% mid high diag stenosis. 99% ostial LAD stenosis Successful percutaneous coronary intervention mid high diag and ostial LAD        PTCA and overlapping stents placement high diag        3.0 X 22 mm Onyx Frontier drug-eluting stent        3.0 X 15 mm Onyx Frontier drug-eluting stent        Post dilatation with 3.25 mm Reliance balloon        80%--->0% residual stenosis. TIMI flow II-->III        PTCA and overlapping stents placement ostial LAD        3.0 X 15 mm Onyx Frontier drug-eluting stent        Post dilatation with 3.5 mm  balloon        99%--->0% residual stenosis. TIMI flow II-->III Patient developed grade 1 Hematoma at access site requiring proximal second TR band. He still has 2+ radial pulse and Doppler + ulnar pulse, but had bluish discoloration of his hand with reduced capillary refill. I suspected distal spasm due to the hematoma. This improved after local application of nitro paste and use of hand warmer. Pleth improved to >90%. Both his hands are cool with low pleth, suggesting that some of the poor waveform is due to hands being cold.  With the above measures, there is no further growth  of hematoma, radial pulse is 2+, hand is looking better with good capillary refill.  Release air from both TR bands simultaneously, as you would for one TR band. Nigel Mormon, MD Pager: [phone] Office: [phone]   CARDIAC CATHETERIZATION  Result Date: 09/25/2021 Images from the original result were not included. LM: Normal LAD: Ostial 99% stenosis with severe calcification Ramus: Mid 80% eccentric stenosis with severe calcification Lcx: Small OM1 with 50% disease        Large OM1 with mid 30% disease RCA: Prox 50% stenosis with severe calcification LVEDP normal LVEF normal on echocardiogram Given location of the ostial LAD lesion with severe calcification with a large ramus arising at an acute angle with respect to LAD. Will consult CVTS for possible CABG to LAD and ramus Limiting  factors could be age and aorta calcification. 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 He does not have living children, but has brothers and sisters in their 73s. I spoke with his sister Geralyn Corwin [phone]) If not a surgical candidate, could consider high risk PCI ostial LAD, along with PCI to ramus Nigel Mormon, MD Pager: [phone] Office: [phone]  ECHOCARDIOGRAM COMPLETE  Result Date: 09/25/2021    ECHOCARDIOGRAM REPORT   Patient Name:   DEAGLAN LILE Date of Exam: 09/25/2021 Medical Rec #:  [id number]        Height:       72.0 in Accession #:    [id number]       Weight:       210.0 lb Date of Birth:  03-29-1934        BSA:          2.175 m Patient Age:    87 years         BP:           140/74 mmHg Patient Gender: M                HR:           59 bpm. Exam Location:  Inpatient Procedure: 2D Echo, Cardiac Doppler and Color Doppler Indications:    R07.9* Chest pain, unspecified  History:        Patient has no prior history of Echocardiogram examinations.                 Risk Factors:Hypertension.  Sonographer:    Bernadene Person RDCS Referring Phys: [id number]  Rex Kras  Sonographer Comments: No subcostal window. IMPRESSIONS  1. Left ventricular ejection fraction, by estimation, is 50 to 55%. The left ventricle has low normal function. The left ventricle demonstrates regional wall motion abnormalities (see scoring diagram/findings for description). Left ventricular diastolic  parameters were normal.  2. Right ventricular systolic function is normal. The right ventricular size is normal.  3. The mitral valve is normal in structure. No evidence of mitral valve regurgitation. No evidence of mitral stenosis.  4. The aortic valve is tricuspid. Aortic valve regurgitation is not visualized. No aortic stenosis is present.  5. There is mild dilatation of the aortic root, measuring 43 mm. Comparison(s): No prior Echocardiogram. FINDINGS  Left Ventricle: Hypokinesis of proximal to distal anteroseptal wall ([email or web address]). Left ventricular ejection fraction, by estimation, is 50 to 55%. The left ventricle has low normal function. The left ventricle demonstrates regional wall motion abnormalities. The left ventricular internal cavity size was normal in size. There is no left ventricular hypertrophy. Left ventricular diastolic parameters were normal.  LV Wall Scoring: The anterior septum is hypokinetic. Right Ventricle: The right ventricular size is normal. No increase in right ventricular wall thickness. Right ventricular systolic function is normal. Left Atrium: Left atrial size was normal in size. Right Atrium: Right atrial size was normal in size. Pericardium: There is no evidence of pericardial effusion. Mitral Valve: The mitral valve is normal in structure. No evidence of mitral valve regurgitation. No evidence of mitral valve stenosis. Tricuspid Valve: The tricuspid valve is normal in structure. Tricuspid valve regurgitation is not demonstrated. No evidence of tricuspid stenosis. Aortic Valve: The aortic valve is tricuspid. Aortic valve regurgitation is not visualized. No aortic  stenosis is present. Pulmonic Valve: The pulmonic valve was not well visualized. Pulmonic valve regurgitation is not visualized. No evidence of pulmonic stenosis. Aorta: The ascending aorta  was not well visualized. There is mild dilatation of the aortic root, measuring 43 mm. IAS/Shunts: The interatrial septum was not assessed.  LEFT VENTRICLE PLAX 2D LVIDd:         4.00 cm      Diastology LVIDs:         2.90 cm      LV e' medial:    7.10 cm/s LV PW:         1.10 cm      LV E/e' medial:  12.1 LV IVS:        1.40 cm      LV e' lateral:   9.02 cm/s LVOT diam:     2.20 cm      LV E/e' lateral: 9.5 LV SV:         82 LV SV Index:   38 LVOT Area:     3.80 cm  LV Volumes (MOD) LV vol d, MOD A2C: 109.0 ml LV vol d, MOD A4C: 133.0 ml LV vol s, MOD A2C: 44.0 ml LV vol s, MOD A4C: 64.2 ml LV SV MOD A2C:     65.0 ml LV SV MOD A4C:     133.0 ml LV SV MOD BP:      67.1 ml RIGHT VENTRICLE RV S prime:     11.70 cm/s TAPSE (M-mode): 1.8 cm LEFT ATRIUM             Index        RIGHT ATRIUM           Index LA diam:        4.40 cm 2.02 cm/m   RA Area:     11.50 cm LA Vol (A2C):   35.1 ml 16.14 ml/m  RA Volume:   19.20 ml  8.83 ml/m LA Vol (A4C):   33.7 ml 15.49 ml/m LA Biplane Vol: 35.5 ml 16.32 ml/m  AORTIC VALVE LVOT Vmax:   97.50 cm/s LVOT Vmean:  61.300 cm/s LVOT VTI:    0.215 m  AORTA Ao Root diam: 4.30 cm Ao Asc diam:  3.80 cm MITRAL VALVE MV Area (PHT): 2.48 cm     SHUNTS MV Decel Time: 306 msec     Systemic VTI:  0.22 m MV E velocity: 85.90 cm/s   Systemic Diam: 2.20 cm MV A velocity: 108.00 cm/s MV E/A ratio:  0.80 Sunit Tolia DO Electronically signed by Rex Kras DO Signature Date/Time: 09/25/2021/4:01:39 PM    Final        Subjective:  No new complaints.  Discharge Exam: Vitals:   09/27/21 0004 09/27/21 0534  BP: 109/69 124/76  Pulse: 60 60  Resp: 18 18  Temp: 98.2 F (36.8 C) 97.9 F (36.6 C)  SpO2: 99% 96%   Vitals:   09/26/21 1741 09/26/21 1952 09/27/21 0004 09/27/21 0534  BP:  (!) 112/53  109/69 124/76  Pulse: (!) 49 70 60 60  Resp: 18 18 18 18   Temp:  98.2 F (36.8 C) 98.2 F (36.8 C) 97.9 F (36.6 C)  TempSrc:  Oral Oral Oral  SpO2: 99% 95% 99% 96%  Weight:      Height:        General: Pt is alert, awake, not in acute distress Cardiovascular: RRR, S1/S2 +, no rubs, no gallops Respiratory: CTA bilaterally, no wheezing, no rhonchi Abdominal: Soft, NT, ND, bowel sounds + Extremities: no edema, no cyanosis    The results of significant diagnostics from this hospitalization (including imaging, microbiology, ancillary and  laboratory) are listed below for reference.     Microbiology: Recent Results (from the past 240 hour(s))  Resp Panel by RT-PCR (Flu A&B, Covid) Nasopharyngeal Swab     Status: None   Collection Time: 09/24/21  5:01 PM   Specimen: Nasopharyngeal Swab; Nasopharyngeal(NP) swabs in vial transport medium  Result Value Ref Range Status   SARS Coronavirus 2 by RT PCR NEGATIVE NEGATIVE Final    Comment: (NOTE) SARS-CoV-2 target nucleic acids are NOT DETECTED.  The SARS-CoV-2 RNA is generally detectable in upper respiratory specimens during the acute phase of infection. The lowest concentration of SARS-CoV-2 viral copies this assay can detect is 138 copies/mL. A negative result does not preclude SARS-Cov-2 infection and should not be used as the sole basis for treatment or other patient management decisions. A negative result may occur with  improper specimen collection/handling, submission of specimen other than nasopharyngeal swab, presence of viral mutation(s) within the areas targeted by this assay, and inadequate number of viral copies(<138 copies/mL). A negative result must be combined with clinical observations, patient history, and epidemiological information. The expected result is Negative.  Fact Sheet for Patients:  EntrepreneurPulse.com.au  Fact Sheet for Healthcare Providers:   IncredibleEmployment.be  This test is no t yet approved or cleared by the [place] FDA and  has been authorized for detection and/or diagnosis of SARS-CoV-2 by FDA under an Emergency Use Authorization (EUA). This EUA will remain  in effect (meaning this test can be used) for the duration of the COVID-19 declaration under Section 564(b)(1) of the Act, 21 U.S.C.section 360bbb-3(b)(1), unless the authorization is terminated  or revoked sooner.       Influenza A by PCR NEGATIVE NEGATIVE Final   Influenza B by PCR NEGATIVE NEGATIVE Final    Comment: (NOTE) The Xpert Xpress SARS-CoV-2/FLU/RSV plus assay is intended as an aid in the diagnosis of influenza from Nasopharyngeal swab specimens and should not be used as a sole basis for treatment. Nasal washings and aspirates are unacceptable for Xpert Xpress SARS-CoV-2/FLU/RSV testing.  Fact Sheet for Patients: EntrepreneurPulse.com.au  Fact Sheet for Healthcare Providers: IncredibleEmployment.be  This test is not yet approved or cleared by the [place] FDA and has been authorized for detection and/or diagnosis of SARS-CoV-2 by FDA under an Emergency Use Authorization (EUA). This EUA will remain in effect (meaning this test can be used) for the duration of the COVID-19 declaration under Section 564(b)(1) of the Act, 21 U.S.C. section 360bbb-3(b)(1), unless the authorization is terminated or revoked.  Performed at [facility], [place] [address] [place], [place] [address]      Labs: BNP (last 3 results) Recent Labs    09/26/21 0657  BNP 23.5   Basic Metabolic Panel: Recent Labs  Lab 09/24/21 1438 09/25/21 0334 09/26/21 0115 09/27/21 0326  NA 136 133* 138 134*  K 4.1 3.9 4.6 4.0  CL 102 101 105 104  CO2 25 25 27 24   GLUCOSE 92 94 101* 103*  BUN 12 11 13 16   CREATININE 1.06 0.98 1.08 1.04  CALCIUM 9.9 9.3 9.0 8.9   Liver Function Tests: Recent Labs   Lab 09/25/21 0334  AST 25  ALT 15  ALKPHOS 98  BILITOT 1.2  PROT 6.0*  ALBUMIN 3.7   No results for input(s): LIPASE, AMYLASE in the last 168 hours. No results for input(s): AMMONIA in the last 168 hours. CBC: Recent Labs  Lab 09/24/21 1438 09/25/21 0334 09/26/21 0115 09/27/21 0326  WBC 6.5 5.3 6.1 6.3  HGB 14.5 14.0 13.0 11.9*  HCT 43.7 42.0 37.7* 35.4*  MCV 98.9 100.2* 97.2 97.5  PLT 167 146* 144* 133*   Cardiac Enzymes: Recent Labs  Lab 09/24/21 1635  CKTOTAL 66  CKMB 1.1   BNP: Invalid input(s): POCBNP CBG: No results for input(s): GLUCAP in the last 168 hours. D-Dimer No results for input(s): DDIMER in the last 72 hours. Hgb A1c No results for input(s): HGBA1C in the last 72 hours. Lipid Profile Recent Labs    09/25/21 0334  CHOL 117  HDL 33*  LDLCALC 72  TRIG 58  CHOLHDL 3.5   Thyroid function studies No results for input(s): TSH, T4TOTAL, T3FREE, THYROIDAB in the last 72 hours.  Invalid input(s): FREET3 Anemia work up No results for input(s): VITAMINB12, FOLATE, FERRITIN, TIBC, IRON, RETICCTPCT in the last 72 hours. Urinalysis    Component Value Date/Time   COLORURINE lt. yellow 01/05/2008 1346   APPEARANCEUR Clear 01/05/2008 1346   LABSPEC <1.005 01/05/2008 1346   PHURINE 6.0 01/05/2008 1346   HGBUR negative 01/05/2008 1346   BILIRUBINUR negative 01/05/2008 1346   UROBILINOGEN 0.2 01/05/2008 1346   NITRITE negative 01/05/2008 1346   Sepsis Labs Invalid input(s): PROCALCITONIN,  WBC,  LACTICIDVEN Microbiology Recent Results (from the past 240 hour(s))  Resp Panel by RT-PCR (Flu A&B, Covid) Nasopharyngeal Swab     Status: None   Collection Time: 09/24/21  5:01 PM   Specimen: Nasopharyngeal Swab; Nasopharyngeal(NP) swabs in vial transport medium  Result Value Ref Range Status   SARS Coronavirus 2 by RT PCR NEGATIVE NEGATIVE Final    Comment: (NOTE) SARS-CoV-2 target nucleic acids are NOT DETECTED.  The SARS-CoV-2 RNA is generally  detectable in upper respiratory specimens during the acute phase of infection. The lowest concentration of SARS-CoV-2 viral copies this assay can detect is 138 copies/mL. A negative result does not preclude SARS-Cov-2 infection and should not be used as the sole basis for treatment or other patient management decisions. A negative result may occur with  improper specimen collection/handling, submission of specimen other than nasopharyngeal swab, presence of viral mutation(s) within the areas targeted by this assay, and inadequate number of viral copies(<138 copies/mL). A negative result must be combined with clinical observations, patient history, and epidemiological information. The expected result is Negative.  Fact Sheet for Patients:  EntrepreneurPulse.com.au  Fact Sheet for Healthcare Providers:  IncredibleEmployment.be  This test is no t yet approved or cleared by the [place] FDA and  has been authorized for detection and/or diagnosis of SARS-CoV-2 by FDA under an Emergency Use Authorization (EUA). This EUA will remain  in effect (meaning this test can be used) for the duration of the COVID-19 declaration under Section 564(b)(1) of the Act, 21 U.S.C.section 360bbb-3(b)(1), unless the authorization is terminated  or revoked sooner.       Influenza A by PCR NEGATIVE NEGATIVE Final   Influenza B by PCR NEGATIVE NEGATIVE Final    Comment: (NOTE) The Xpert Xpress SARS-CoV-2/FLU/RSV plus assay is intended as an aid in the diagnosis of influenza from Nasopharyngeal swab specimens and should not be used as a sole basis for treatment. Nasal washings and aspirates are unacceptable for Xpert Xpress SARS-CoV-2/FLU/RSV testing.  Fact Sheet for Patients: EntrepreneurPulse.com.au  Fact Sheet for Healthcare Providers: IncredibleEmployment.be  This test is not yet approved or cleared by the [place] FDA  and has been authorized for detection and/or diagnosis of SARS-CoV-2 by FDA under an Emergency Use Authorization (EUA). This EUA will remain in effect (  meaning this test can be used) for the duration of the COVID-19 declaration under Section 564(b)(1) of the Act, 21 U.S.C. section 360bbb-3(b)(1), unless the authorization is terminated or revoked.  Performed at [facility], [place] [address], [place], [place] [address]      Time coordinating discharge:36 minutes.   SIGNED:   Hosie Poisson, MD  [facility]

## 2021-09-27 NOTE — Care Management (Signed)
°  Transition of Care [facility]) Screening Note   Patient Details  Name: Jamie Byrd Date of Birth: 1934-05-11   Transition of [facility]) CM/SW Contact:    Bethena Roys, RN Phone Number: 09/27/2021, 8:52 AM    Transition of Care Department [facility]) has reviewed patient and no TOC needs have been identified at this time. Plan for patient to transition home today.

## 2021-09-27 NOTE — Progress Notes (Addendum)
Subjective:  Feels well No complaints  Objective:  Vital Signs in the last 24 hours: Temp:  [97.9 F (36.6 C)-98.2 F (36.8 C)] 97.9 F (36.6 C) (12/16 0534) Pulse Rate:  [41-86] 60 (12/16 0534) Resp:  [9-68] 18 (12/16 0534) BP: (102-164)/(53-81) 124/76 (12/16 0534) SpO2:  [0 %-100 %] 96 % (12/16 0534)  Intake/Output from previous day: 12/15 0701 - 12/16 0700 In: -  Out: 950 [Urine:950]  Physical Exam Vitals and nursing note reviewed.  Constitutional:      General: He is not in acute distress.    Appearance: He is well-developed.  HENT:     Head: Normocephalic and atraumatic.  Eyes:     Conjunctiva/sclera: Conjunctivae normal.     Pupils: Pupils are equal, round, and reactive to light.  Neck:     Vascular: No JVD.  Cardiovascular:     Rate and Rhythm: Normal rate and regular rhythm.     Pulses: Normal pulses and intact distal pulses.     Heart sounds: No murmur heard. Pulmonary:     Effort: Pulmonary effort is normal.     Breath sounds: Normal breath sounds. No wheezing or rales.  Abdominal:     General: Bowel sounds are normal.     Palpations: Abdomen is soft.     Tenderness: There is no rebound.  Musculoskeletal:        General: No tenderness. Normal range of motion.     Right lower leg: No edema.     Left lower leg: No edema.  Lymphadenopathy:     Cervical: No cervical adenopathy.  Skin:    General: Skin is warm and dry.     Findings: Bruising (Mild bruising right forearm) present.  Neurological:     Mental Status: He is alert and oriented to person, place, and time.     Cranial Nerves: No cranial nerve deficit.     Lab Results: BMP Recent Labs    09/25/21 0334 09/26/21 0115 09/27/21 0326  NA 133* 138 134*  K 3.9 4.6 4.0  CL 101 105 104  CO2 25 27 24   GLUCOSE 94 101* 103*  BUN 11 13 16   CREATININE 0.98 1.08 1.04  CALCIUM 9.3 9.0 8.9  GFRNONAA >60 >60 >60    CBC Recent Labs  Lab 09/27/21 0326  WBC 6.3  RBC 3.63*  HGB 11.9*  HCT  35.4*  PLT 133*  MCV 97.5  MCH 32.8  MCHC 33.6  RDW 13.5    HEMOGLOBIN A1C Lab Results  Component Value Date   HGBA1C 5.3 05/23/2021    Cardiac Panel (last 3 results) Recent Labs    09/24/21 1635  CKTOTAL 66  CKMB 1.1  RELINDX RELATIVE INDEX IS INVALID    BNP (last 3 results) Recent Labs    09/26/21 0657  BNP 92.5    TSH Recent Labs    05/23/21 1001  TSH 5.52*    Lipid Panel     Component Value Date/Time   CHOL 117 09/25/2021 0334   TRIG 58 09/25/2021 0334   HDL 33 (L) 09/25/2021 0334   CHOLHDL 3.5 09/25/2021 0334   VLDL 12 09/25/2021 0334   LDLCALC 72 09/25/2021 0334     Hepatic Function Panel Recent Labs    05/23/21 1001 09/25/21 0334  PROT 6.7 6.0*  ALBUMIN 4.3 3.7  AST 19 25  ALT 10 15  ALKPHOS 105 98  BILITOT 0.8 1.2     Cardiac Studies:  EKG 09/27/2021: Sinus rhythm Occasional PAC  Septal infarct, age indeterminate  Coronary intervention 09/26/2021: Coronary intervention: 80% mid high diag stenosis.  99% ostial LAD stenosis Successful percutaneous coronary intervention mid high diag and ostial LAD        PTCA and overlapping stents placement high diag        3.0 X 22 mm Onyx Frontier drug-eluting stent        3.0 X 15 mm Onyx Frontier drug-eluting stent         Post dilatation with 3.25 mm [place] balloon        80%--->0% residual stenosis. TIMI flow II-->III        PTCA and overlapping stents placement ostial LAD        3.0 X 15 mm Onyx Frontier drug-eluting stent        Post dilatation with 3.5 mm [place] balloon        99%--->0% residual stenosis. TIMI flow II-->III  Coronary angiogram 09/25/2021: LM: Normal LAD: Ostial 99% stenosis with severe calcification Ramus: Mid 80% eccentric stenosis with severe calcification Lcx: Small OM1 with 50% disease        Large OM1 with mid 30% disease RCA: Prox 50% stenosis with severe calcification   LVEDP normal LVEF normal on echocardiogram   Given location of the ostial LAD lesion with  severe calcification with a large ramus arising at an acute angle with respect to LAD.  Will consult CVTS for possible CABG to LAD and ramus Limiting factors could be age and aorta calcification. Also, patient is the primary caretaker to his 29 y/o wife with dementia. He does not have living children, but has brothers and sisters in their 52s. I spoke with his sister Geralyn Corwin [phone])   If not a surgical candidate, could consider high risk PCI ostial LAD, along with PCI to ramus  Echocardiogram 09/25/2021:  1. Left ventricular ejection fraction, by estimation, is 50 to 55%. The  left ventricle has low normal function. The anterior septum is hypokinetic.  Left ventricular diastolic parameters were normal.   2. Right ventricular systolic function is normal. The right ventricular  size is normal.   3. The mitral valve is normal in structure. No evidence of mitral valve  regurgitation. No evidence of mitral stenosis.   4. The aortic valve is tricuspid. Aortic valve regurgitation is not  visualized. No aortic stenosis is present.   5. There is mild dilatation of the aortic root, measuring 43 mm.   Assessment & Recommendations:  87 year old Caucasian male with hypertension, hypothyroidism, prediabetes, former smoker, admitted with unstable angina   CAD: Unstable angina High grade 99% ostial LAD lesion. Another 80% lesion in ramus/high diag. S/p successful atherectomy and OCT guided complex PCI (09/26/2021) after patient refused to consider CABG. Recommend DAPT with Aspirin and plavix for at least 1 year Avoiding beta blocker due to resting bradycardia Continue Crestor 20 mg Resume lisinopril given mild aortic root dilatation, perhaps at 20 mg. Cardiac rehab   Right forearm hematoma: Resolving  Okay to discharge after ambulation, TOC meds received. F/u 12/28 w/Celeste Cantwell, PA in our office at 10:00 AM   Nigel Mormon, MD Pager: [phone] Office:  [phone]

## 2021-09-27 NOTE — Progress Notes (Addendum)
CARDIAC REHAB PHASE I   PRE:  Rate/Rhythm: 75 SR    BP: sitting 109/74    SaO2: 95 RA  MODE:  Ambulation: 470 ft   POST:  Rate/Rhythm: 86 SR    BP: sitting 128/75     SaO2: 95 RA  Quick ambulation, no c/o. VSS. Discussed stent, Plavix, restrictions, exercise, NTG and CRPII. Pt very receptive. Not interested in CRPII (can't drive, cares for wife, likes to walk) but will place referral to [facility].   [place], ACSM 09/27/2021 9:03 AM

## 2021-09-27 NOTE — Telephone Encounter (Signed)
Location of hospitalization: [facility] Reason for hospitalization: chest pain Date of discharge: 09/27/2021 Date of first communication with patient: today Person contacting patient: Me Current symptoms: no Do you understand why you were in the Hospital: Yes Questions regarding discharge instructions: None Where were you discharged to: Home Medications reviewed: Yes Allergies reviewed: Yes Dietary changes reviewed: Yes. Discussed low fat and low salt diet.  Referals reviewed: NA Activities of Daily Living: Able to with mild limitations Any transportation issues/concerns: None Any patient concerns: None Confirmed importance & date/time of Follow up appt: Yes Confirmed with patient if condition begins to worsen call. Pt was given the office number and encouraged to call back with questions or concerns: Yes

## 2021-10-09 ENCOUNTER — Other Ambulatory Visit: Payer: Self-pay

## 2021-10-09 ENCOUNTER — Encounter: Payer: Self-pay | Admitting: Student

## 2021-10-09 ENCOUNTER — Ambulatory Visit: Payer: Medicare Other | Admitting: Student

## 2021-10-09 VITALS — BP 125/80 | HR 71 | Temp 98.1°F | Ht 72.0 in | Wt 210.0 lb

## 2021-10-09 DIAGNOSIS — I77810 Thoracic aortic ectasia: Secondary | ICD-10-CM

## 2021-10-09 DIAGNOSIS — I200 Unstable angina: Secondary | ICD-10-CM

## 2021-10-09 DIAGNOSIS — I10 Essential (primary) hypertension: Secondary | ICD-10-CM | POA: Diagnosis not present

## 2021-10-09 DIAGNOSIS — I25118 Atherosclerotic heart disease of native coronary artery with other forms of angina pectoris: Secondary | ICD-10-CM | POA: Diagnosis not present

## 2021-10-09 MED ORDER — ROSUVASTATIN CALCIUM 20 MG PO TABS: 20.0000 mg | ORAL_TABLET | Freq: Every day | ORAL | 3 refills | Status: DC

## 2021-10-09 MED ORDER — METOPROLOL SUCCINATE ER 25 MG PO TB24: 12.5000 mg | ORAL_TABLET | Freq: Every day | ORAL | 3 refills | Status: AC

## 2021-10-09 MED ORDER — LISINOPRIL 10 MG PO TABS: 10.0000 mg | ORAL_TABLET | Freq: Every day | ORAL | 3 refills | Status: DC

## 2021-10-09 NOTE — Progress Notes (Signed)
Primary Physician/Referring:  Tower, Wynelle Fanny, MD  Patient ID: Jamie Byrd, male    DOB: 1934/07/02, 87 y.o.   MRN: [id number]  Chief Complaint  Patient presents with   Chest Pain   Follow-up   HPI:    Jamie Byrd  is a 87 y.o. Caucasian male with history of hypertension, hypothyroidism, prediabetes, former smoker.  Patient presented to the emergency department 09/25/2021 with chest pain ongoing for the previous 7 days.  Given unstable angina patient underwent coronary angiogram which noted 99% ostial stenosis in the LAD and 80% stenosis of ramus.  Given findings of the angiogram recommended consultation from CVTS for possible CABG, however patient was adamantly against CABG and instead opted for high risk PCI.  Patient underwent successful atherectomy and OCT guided complex PCI (09/26/2021) after patient refused to consider CABG. patient did have right forearm hematoma after cardiac catheterization, which was resolving at discharge.  Patient was discharged with aspirin and Plavix, lisinopril, rosuvastatin, and as needed sublingual nitroglycerin.  He now presents for follow-up  Upon further questioning patient is not currently taking statin therapy.  Overall patient is feeling well and is anxious to resume activities around the house including yard work.  Denies chest pain, dyspnea, syncope, near syncope.  Denies orthopnea, PND, leg edema.  Past Medical History:  Diagnosis Date   BCC (basal cell carcinoma) 09/12/2021   right neck posterior   BCC (basal cell carcinoma) 09/12/2021   right neck anterior   BCC (basal cell carcinoma) 09/12/2021   right temple   BCC (basal cell carcinoma) 09/12/2021   left temple   BCC (basal cell carcinoma) 09/12/2021   left zygoma   BPH (benign prostatic hypertrophy)    Difficulty reading    Difficulty seeing    Difficulty in seeing certain things and reading/ not legally blind!   Gout    History of shingles    Hypertension     Hypothyroidism    Macular degeneration    Past Surgical History:  Procedure Laterality Date   COLONOSCOPY     CORONARY ATHERECTOMY N/A 09/26/2021   Procedure: CORONARY ATHERECTOMY;  Surgeon: Nigel Mormon, MD;  Location: [facility];  Service: Cardiovascular;  Laterality: N/A;   CORONARY STENT INTERVENTION N/A 09/26/2021   Procedure: CORONARY STENT INTERVENTION;  Surgeon: Nigel Mormon, MD;  Location: [facility];  Service: Cardiovascular;  Laterality: N/A;   INTRAVASCULAR IMAGING/OCT N/A 09/26/2021   Procedure: INTRAVASCULAR IMAGING/OCT;  Surgeon: Nigel Mormon, MD;  Location: [facility];  Service: Cardiovascular;  Laterality: N/A;   LEFT HEART CATH AND CORONARY ANGIOGRAPHY N/A 09/25/2021   Procedure: LEFT HEART CATH AND CORONARY ANGIOGRAPHY;  Surgeon: Nigel Mormon, MD;  Location: [facility];  Service: Cardiovascular;  Laterality: N/A;   POLYPECTOMY     Family History  Problem Relation Age of Onset   Heart disease Mother        CABG   Heart disease Father        CAD   Cancer Father        lung CA   Cancer Brother        lung CA    Social History   Tobacco Use   Smoking status: Former    Types: Cigarettes    Quit date: 10/13/1968    Years since quitting: 53.0   Smokeless tobacco: Never  Substance Use Topics   Alcohol use: No    Comment: no alcohol in  53 years   Marital Status: Married   ROS  Review of Systems  Constitutional: Negative for malaise/fatigue and weight gain.  Cardiovascular:  Negative for chest pain, claudication, leg swelling, near-syncope, orthopnea, palpitations, paroxysmal nocturnal dyspnea and syncope.  Respiratory:  Negative for shortness of breath.   Neurological:  Negative for dizziness.   Objective  Blood pressure 125/80, pulse 71, temperature 98.1 F (36.7 C), height 6' (1.829 m), weight 210 lb (95.3 kg), SpO2 98 %.  Vitals with BMI 10/09/2021 09/27/2021 09/27/2021  Height 6\' 0"  - -   Weight 210 lbs - -  BMI 16.10 - -  Systolic 960 454 098  Diastolic 80 75 54  Pulse 71 75 76      Physical Exam Vitals reviewed.  Cardiovascular:     Rate and Rhythm: Normal rate and regular rhythm.     Pulses: Intact distal pulses.     Heart sounds: S1 normal and S2 normal. No murmur heard.   No gallop.     Comments: Radial access site healing well.  Mild surrounding ecchymosis.  No evidence of bruit. Pulmonary:     Effort: Pulmonary effort is normal. No respiratory distress.     Breath sounds: No wheezing, rhonchi or rales.  Musculoskeletal:     Right lower leg: No edema.     Left lower leg: No edema.  Neurological:     Mental Status: He is alert.    Laboratory examination:   Recent Labs    09/25/21 0334 09/26/21 0115 09/27/21 0326  NA 133* 138 134*  K 3.9 4.6 4.0  CL 101 105 104  CO2 25 27 24   GLUCOSE 94 101* 103*  BUN 11 13 16   CREATININE 0.98 1.08 1.04  CALCIUM 9.3 9.0 8.9  GFRNONAA >60 >60 >60   estimated creatinine clearance is 62.2 mL/min (by C-G formula based on SCr of 1.04 mg/dL).  CMP Latest Ref Rng & Units 09/27/2021 09/26/2021 09/25/2021  Glucose 70 - 99 mg/dL 103(H) 101(H) 94  BUN 8 - 23 mg/dL 16 13 11   Creatinine 0.61 - 1.24 mg/dL 1.04 1.08 0.98  Sodium 135 - 145 mmol/L 134(L) 138 133(L)  Potassium 3.5 - 5.1 mmol/L 4.0 4.6 3.9  Chloride 98 - 111 mmol/L 104 105 101  CO2 22 - 32 mmol/L 24 27 25   Calcium 8.9 - 10.3 mg/dL 8.9 9.0 9.3  Total Protein 6.5 - 8.1 g/dL - - 6.0(L)  Total Bilirubin 0.3 - 1.2 mg/dL - - 1.2  Alkaline Phos 38 - 126 U/L - - 98  AST 15 - 41 U/L - - 25  ALT 0 - 44 U/L - - 15   CBC Latest Ref Rng & Units 09/27/2021 09/26/2021 09/25/2021  WBC 4.0 - 10.5 K/uL 6.3 6.1 5.3  Hemoglobin 13.0 - 17.0 g/dL 11.9(L) 13.0 14.0  Hematocrit 39.0 - 52.0 % 35.4(L) 37.7(L) 42.0  Platelets 150 - 400 K/uL 133(L) 144(L) 146(L)    Lipid Panel Recent Labs    05/23/21 1001 09/25/21 0334  CHOL 116 117  TRIG 139.0 58  LDLCALC 58 72  VLDL  27.8 12  HDL 29.70* 33*  CHOLHDL 4 3.5    HEMOGLOBIN A1C Lab Results  Component Value Date   HGBA1C 5.3 05/23/2021   TSH Recent Labs    05/23/21 1001  TSH 5.52*    External labs:   None  Allergies  No Known Allergies    Medications Prior to Visit:   Outpatient Medications Prior to Visit  Medication Sig  Dispense Refill   allopurinol (ZYLOPRIM) 100 MG tablet Take 1 tablet (100 mg total) by mouth daily. 90 tablet 3   aspirin 81 MG EC tablet Take 1 tablet (81 mg total) by mouth daily. Swallow whole. 30 tablet 2   levothyroxine (SYNTHROID) 88 MCG tablet Take 1 tablet (88 mcg total) by mouth daily. 90 tablet 3   nitroGLYCERIN (NITROSTAT) 0.4 MG SL tablet Place 1 tablet (0.4 mg total) under the tongue every 5 (five) minutes as needed for chest pain. 30 tablet 2   lisinopril (ZESTRIL) 20 MG tablet Take 1 tablet (20 mg total) by mouth daily. (Patient taking differently: Take 10 mg by mouth daily.) 30 tablet 2   clopidogrel (PLAVIX) 75 MG tablet Take 1 tablet (75 mg total) by mouth daily. 30 tablet 2   rosuvastatin (CRESTOR) 20 MG tablet Take 1 tablet (20 mg total) by mouth daily. (Patient not taking: Reported on 10/09/2021) 30 tablet 2   No facility-administered medications prior to visit.   Final Medications at End of Visit    Current Meds  Medication Sig   allopurinol (ZYLOPRIM) 100 MG tablet Take 1 tablet (100 mg total) by mouth daily.   aspirin 81 MG EC tablet Take 1 tablet (81 mg total) by mouth daily. Swallow whole.   levothyroxine (SYNTHROID) 88 MCG tablet Take 1 tablet (88 mcg total) by mouth daily.   metoprolol succinate (TOPROL-XL) 25 MG 24 hr tablet Take 0.5 tablets (12.5 mg total) by mouth daily. Take with or immediately following a meal.   nitroGLYCERIN (NITROSTAT) 0.4 MG SL tablet Place 1 tablet (0.4 mg total) under the tongue every 5 (five) minutes as needed for chest pain.   [DISCONTINUED] lisinopril (ZESTRIL) 20 MG tablet Take 1 tablet (20 mg total) by mouth  daily. (Patient taking differently: Take 10 mg by mouth daily.)   Radiology:   No results found.  Cardiac Studies:   Coronary intervention October 15, 2021: Coronary intervention: 80% mid high diag stenosis.  99% ostial LAD stenosis Successful percutaneous coronary intervention mid high diag and ostial LAD        PTCA and overlapping stents placement high diag        3.0 X 22 mm Onyx Frontier drug-eluting stent        3.0 X 15 mm Onyx Frontier drug-eluting stent         Post dilatation with 3.25 mm [place] balloon        80%--->0% residual stenosis. TIMI flow II-->III        PTCA and overlapping stents placement ostial LAD        3.0 X 15 mm Onyx Frontier drug-eluting stent        Post dilatation with 3.5 mm [place] balloon        99%--->0% residual stenosis. TIMI flow II-->III   Coronary angiogram 09/25/2021: LM: Normal LAD: Ostial 99% stenosis with severe calcification Ramus: Mid 80% eccentric stenosis with severe calcification Lcx: Small OM1 with 50% disease        Large OM1 with mid 30% disease RCA: Prox 50% stenosis with severe calcification   LVEDP normal LVEF normal on echocardiogram   Given location of the ostial LAD lesion with severe calcification with a large ramus arising at an acute angle with respect to LAD.  Will consult CVTS for possible CABG to LAD and ramus Limiting factors could be age and aorta calcification. Also, patient is the primary caretaker to his 54 y/o wife with dementia. He does not have living children,  but has brothers and sisters in their 10s. I spoke with his sister Geralyn Corwin [phone])   If not a surgical candidate, could consider high risk PCI ostial LAD, along with PCI to ramus   Echocardiogram 09/25/2021:  1. Left ventricular ejection fraction, by estimation, is 50 to 55%. The  left ventricle has low normal function. The anterior septum is hypokinetic.  Left ventricular diastolic parameters were normal.   2. Right ventricular systolic  function is normal. The right ventricular  size is normal.   3. The mitral valve is normal in structure. No evidence of mitral valve  regurgitation. No evidence of mitral stenosis.   4. The aortic valve is tricuspid. Aortic valve regurgitation is not  visualized. No aortic stenosis is present.   5. There is mild dilatation of the aortic root, measuring 43 mm.   EKG:   10/01/2021: Sinus rhythm rate of 64 bpm.  Normal axis.  Anteroseptal infarct old.  EKG 09/27/2021: Sinus rhythm Occasional PAC Septal infarct, age indeterminate  Assessment     ICD-10-CM   1. Unstable angina (HCC)  I20.0     2. Coronary artery disease of native artery of native heart with stable angina pectoris ([place])  I25.118 EKG 12-Lead    3. Essential hypertension  I10     4. Dilated aortic root (HCC)  I77.810        Medications Discontinued During This Encounter  Medication Reason   rosuvastatin (CRESTOR) 20 MG tablet Reorder   lisinopril (ZESTRIL) 20 MG tablet Reorder    Meds ordered this encounter  Medications   rosuvastatin (CRESTOR) 20 MG tablet    Sig: Take 1 tablet (20 mg total) by mouth daily.    Dispense:  90 tablet    Refill:  3   metoprolol succinate (TOPROL-XL) 25 MG 24 hr tablet    Sig: Take 0.5 tablets (12.5 mg total) by mouth daily. Take with or immediately following a meal.    Dispense:  45 tablet    Refill:  3   lisinopril (ZESTRIL) 10 MG tablet    Sig: Take 1 tablet (10 mg total) by mouth daily.    Dispense:  90 tablet    Refill:  3    Recommendations:   KAMALI NEPHEW is a 5 y.o. Caucasian male with history of hypertension, hypothyroidism, prediabetes, former smoker.  Presented with unstable angina 09/25/2021 with high grade 99% ostial LAD lesion and another 80% lesion in ramus/high diag. He underwent successful atherectomy and OCT guided complex PCI (09/26/2021) after patient refused to consider CABG.  CAD: Continue dual antiplatelet therapy with aspirin and Plavix until  at least 09/2022 Continue lisinopril 10 mg daily Patient was discharged with rosuvastatin, however he is not currently taking this.  We will start rosuvastatin 20 mg daily Patient's resting bradycardia has improved since discharge, will therefore start low-dose beta-blocker therapy.  Start Toprol-XL 12.5 mg daily Will repeat lipid profile test in 3 months. Echo noted hypokinetic anterior septum, will repeat echocardiogram in 3 months.  Hypertension: Blood pressure is well controlled Continue lisinopril and start low-dose beta-blocker therapy  Mild aortic root dilation: There is mild dilatation of the aortic root, measuring 43 mm on echocardiogram 09/25/2021. Will repeat echocardiogram in 3 months given hypokinetic anterior septum, will continue to monitor aortic root. Could consider transitioning from lisinopril to losartan, will hold off at this time.  Follow-up in 4 weeks, sooner if needed, for CAD.    Alethia Berthold, PA-C 10/09/2021, 3:59 PM Office:  [phone] °

## 2021-10-14 ENCOUNTER — Telehealth (HOSPITAL_COMMUNITY): Payer: Self-pay | Admitting: Pharmacy Technician

## 2021-10-14 ENCOUNTER — Other Ambulatory Visit (HOSPITAL_COMMUNITY): Payer: Self-pay

## 2021-10-14 NOTE — Telephone Encounter (Signed)
Pharmacy Transitions of Care Follow-up Telephone Call  Date of discharge: 09/27/2021  Discharge Diagnosis: CAD s/p PCI  How have you been since you were released from the hospital? Patient states that he "feels like a million bucks" and has been doing very well since his discharge.  Medication changes made at discharge: START taking: Aspirin Low Dose (aspirin) clopidogrel (PLAVIX) nitroGLYCERIN (NITROSTAT) rosuvastatin (CRESTOR) CHANGE how you take: lisinopril (ZESTRIL) STOP taking: mupirocin ointment 2 % (BACTROBAN) PRESERVISION AREDS 2 PO  Medication changes verified by the patient? Yes     Medication Accessibility:  Home Pharmacy: [facility]   Was the patient provided with refills on discharged medications? Yes   Have all prescriptions been transferred from [facility] to home pharmacy? Yes   Is the patient able to afford medications? Patient expressed concern about increasing costs of medications. Advised him if they are too high with his home pharmacy, he could transfer them to one of our pharmacies. Spoke with Gerald Stabs at his home pharmacy and he stated he didn't think the new medications would be expensive.    Medication Review:  Patient declined medication review, stating he had already received information about the medication.    CLOPIDOGREL (PLAVIX) Clopidogrel 75 mg once daily.   Follow-up Appointments:  [facility] f/u appt confirmed? Patient had one f/u last week and has another scheduled with Lawerance Cruel on 11/06/2021 @ 9:00 am.  If their condition worsens, is the pt aware to call PCP or go to the Emergency Dept.? Yes  Final Patient Assessment:  -Pt is doing well.  -Pt verbalized understanding of Plavix.  -Pt has post discharge appointment and refills sent to [facility].   Parthenia Ames, PharmD

## 2021-10-17 ENCOUNTER — Encounter: Payer: Self-pay | Admitting: Dermatology

## 2021-10-24 ENCOUNTER — Ambulatory Visit (INDEPENDENT_AMBULATORY_CARE_PROVIDER_SITE_OTHER): Payer: Medicare Other | Admitting: Dermatology

## 2021-10-24 ENCOUNTER — Other Ambulatory Visit: Payer: Self-pay

## 2021-10-24 DIAGNOSIS — C4441 Basal cell carcinoma of skin of scalp and neck: Secondary | ICD-10-CM | POA: Diagnosis not present

## 2021-10-24 DIAGNOSIS — C44319 Basal cell carcinoma of skin of other parts of face: Secondary | ICD-10-CM | POA: Diagnosis not present

## 2021-10-24 DIAGNOSIS — L570 Actinic keratosis: Secondary | ICD-10-CM | POA: Diagnosis not present

## 2021-10-24 DIAGNOSIS — D485 Neoplasm of uncertain behavior of skin: Secondary | ICD-10-CM

## 2021-10-24 DIAGNOSIS — L578 Other skin changes due to chronic exposure to nonionizing radiation: Secondary | ICD-10-CM | POA: Diagnosis not present

## 2021-10-24 DIAGNOSIS — C44311 Basal cell carcinoma of skin of nose: Secondary | ICD-10-CM | POA: Diagnosis not present

## 2021-10-24 NOTE — Patient Instructions (Signed)
Electrodesiccation and Curettage (“Scrape and Burn”) Wound Care Instructions ° °Leave the original bandage on for 24 hours if possible.  If the bandage becomes soaked or soiled before that time, it is OK to remove it and examine the wound.  A small amount of post-operative bleeding is normal.  If excessive bleeding occurs, remove the bandage, place gauze over the site and apply continuous pressure (no peeking) over the area for 30 minutes. If this does not work, please call our clinic as soon as possible or page your doctor if it is after hours.  ° °Once a day, cleanse the wound with soap and water. It is fine to shower. If a thick crust develops you may use a Q-tip dipped into dilute hydrogen peroxide (mix 1:1 with water) to dissolve it.  Hydrogen peroxide can slow the healing process, so use it only as needed.   ° °After washing, apply petroleum jelly (Vaseline) or an antibiotic ointment if your doctor prescribed one for you, followed by a bandage.   ° °For best healing, the wound should be covered with a layer of ointment at all times. If you are not able to keep the area covered with a bandage to hold the ointment in place, this may mean re-applying the ointment several times a day.  Continue this wound care until the wound has healed and is no longer open. It may take several weeks for the wound to heal and close. ° °Itching and mild discomfort is normal during the healing process. ° °If you have any discomfort, you can take Tylenol (acetaminophen) or ibuprofen as directed on the bottle. (Please do not take these if you have an allergy to them or cannot take them for another reason). ° °Some redness, tenderness and white or yellow material in the wound is normal healing.  If the area becomes very sore and red, or develops a thick yellow-green material (pus), it may be infected; please notify us.   ° °Wound healing continues for up to one year following surgery. It is not unusual to experience pain in the scar  from time to time during the interval.  If the pain becomes severe or the scar thickens, you should notify the office.   ° °A slight amount of redness in a scar is expected for the first six months.  After six months, the redness will fade and the scar will soften and fade.  The color difference becomes less noticeable with time.  If there are any problems, return for a post-op surgery check at your earliest convenience. ° °To improve the appearance of the scar, you can use silicone scar gel, cream, or sheets (such as Mederma or Serica) every night for up to one year. These are available over the counter (without a prescription). ° °Please call our office at [phone] for any questions or concerns. °

## 2021-10-24 NOTE — Progress Notes (Signed)
Follow-Up Visit   Subjective  Jamie Byrd is a 88 y.o. male who presents for the following: Biopsy proven BCC's  (Patient is here today to discuss pathology results and treatment options.) and Actinic Keratosis (Recheck previously treated precancerous lesions and check skin for new ones.).   The following portions of the chart were reviewed this encounter and updated as appropriate:   Tobacco   Allergies   Meds   Problems   Med Hx   Surg Hx   Fam Hx       Review of Systems:  No other skin or systemic complaints except as noted in HPI or Assessment and Plan.  Objective  Well appearing patient in no apparent distress; mood and affect are within normal limits.  A focused examination was performed including the face and neck. Relevant physical exam findings are noted in the Assessment and Plan.  R nasolabial fold 0.4 cm pink papule.     R neck posterior Pink plaque  R neck anterior Pink papule  Right Anterior Mandible x 1, R dorsal hand x 9, R elbow x 1, L hand x 8, L wrist x 1, L forearm x 5, R antihelix x 2, R mid helix x 1 (28) Erythematous thin papules/macules with gritty scale.   R forearm Erythematous thin papules/macules with gritty scale.   Left Temple, R temple, L zygoma Healing biopsy sites.    Assessment & Plan  Neoplasm of uncertain behavior of skin R nasolabial fold  Skin / nail biopsy Type of biopsy: tangential   Informed consent: discussed and consent obtained   Timeout: patient name, date of birth, surgical site, and procedure verified   Procedure prep:  Patient was prepped and draped in usual sterile fashion Prep type:  Isopropyl alcohol Anesthesia: the lesion was anesthetized in a standard fashion   Anesthetic:  1% lidocaine w/ epinephrine 1-100,000 buffered w/ 8.4% NaHCO3 Instrument used: flexible razor blade   Hemostasis achieved with: pressure, aluminum chloride and electrodesiccation   Outcome: patient tolerated procedure well    Post-procedure details: sterile dressing applied and wound care instructions given   Dressing type: bandage and petrolatum    Specimen 1 - Surgical pathology Differential Diagnosis: D48.5 r/o BCC  Check Margins: No  Anticipate referral for Mohs - top priority (#1) with infiltrative BCC ( L temple), hopefully they can perform them the same day. L zygoma has also been referred for Mohs and will take priority #2 if they can do 3 on the same day. Patient does not feel he can make two trips for Mohs surgery, so we will likely treat any remaining lesions in office  Basal cell carcinoma (BCC) of skin of neck (2) R neck posterior  Destruction of lesion Complexity: extensive   Destruction method: electrodesiccation and curettage   Informed consent: discussed and consent obtained   Timeout:  patient name, date of birth, surgical site, and procedure verified Procedure prep:  Patient was prepped and draped in usual sterile fashion Prep type:  Isopropyl alcohol Anesthesia: the lesion was anesthetized in a standard fashion   Anesthetic:  1% lidocaine w/ epinephrine 1-100,000 buffered w/ 8.4% NaHCO3 Curettage performed in three different directions: Yes   Electrodesiccation performed over the curetted area: Yes   Curettage cycles:  3 Final wound size (cm):  1.7 Hemostasis achieved with:  pressure, aluminum chloride and electrodesiccation Outcome: patient tolerated procedure well with no complications   Post-procedure details: sterile dressing applied and wound care instructions given   Dressing  type: bandage and petrolatum    R neck anterior  Destruction of lesion Complexity: extensive   Destruction method: electrodesiccation and curettage   Informed consent: discussed and consent obtained   Timeout:  patient name, date of birth, surgical site, and procedure verified Procedure prep:  Patient was prepped and draped in usual sterile fashion Prep type:  Isopropyl alcohol Anesthesia: the lesion  was anesthetized in a standard fashion   Anesthetic:  1% lidocaine w/ epinephrine 1-100,000 buffered w/ 8.4% NaHCO3 Curettage performed in three different directions: Yes   Electrodesiccation performed over the curetted area: Yes   Curettage cycles:  3 Final wound size (cm):  1.1 Hemostasis achieved with:  pressure, aluminum chloride and electrodesiccation Outcome: patient tolerated procedure well with no complications   Post-procedure details: sterile dressing applied and wound care instructions given   Dressing type: bandage and petrolatum    AK (actinic keratosis) (28) Right Anterior Mandible x 1, R dorsal hand x 9, R elbow x 1, L hand x 8, L wrist x 1, L forearm x 5, R antihelix x 2, R mid helix x 1  Prior to procedure, discussed risks of blister formation, small wound, skin dyspigmentation, or rare scar following cryotherapy. Recommend Vaseline ointment to treated areas while healing.    Destruction of lesion - Right Anterior Mandible x 1, R dorsal hand x 9, R elbow x 1, L hand x 8, L wrist x 1, L forearm x 5, R antihelix x 2, R mid helix x 1 Complexity: simple   Destruction method: cryotherapy   Informed consent: discussed and consent obtained   Timeout:  patient name, date of birth, surgical site, and procedure verified Lesion destroyed using liquid nitrogen: Yes   Region frozen until ice ball extended beyond lesion: Yes   Outcome: patient tolerated procedure well with no complications   Post-procedure details: wound care instructions given    Hypertrophic actinic keratosis R forearm  Consider biopsy if not resolved at follow up appointment.   Prior to procedure, discussed risks of blister formation, small wound, skin dyspigmentation, or rare scar following cryotherapy. Recommend Vaseline ointment to treated areas while healing.   Destruction of lesion - R forearm Complexity: simple   Destruction method: cryotherapy   Informed consent: discussed and consent obtained    Timeout:  patient name, date of birth, surgical site, and procedure verified Lesion destroyed using liquid nitrogen: Yes   Region frozen until ice ball extended beyond lesion: Yes   Outcome: patient tolerated procedure well with no complications   Post-procedure details: wound care instructions given    Basal cell carcinoma (BCC) of skin of other part of face Left Temple, R temple, L zygoma  right temple BASAL CELL CARCINOMA, NODULAR PATTERN, BASE INVOLVED --> Mohs vs ED&C. Will refer for Mohs and see if they can do multiple cancers at same visit given patient difficulty with travel. If not, plan ED&C.  left temple BASAL CELL CARCINOMA, NODULAR AND INFILTRATIVE PATTERNS, BASE INVOLVED --> Mohs. PRIORITY #1 FOR MOHS. Patient in agreement.  left zygoma BASAL CELL CARCINOMA, SUPERFICIAL AND NODULAR PATTERNS, BASE INVOLVED --> Mohs vs ED&C. Will refer for Mohs and see if they can do multiple skin cancers at one visit given patient difficulty with travel. If not possible, will plan ED&C and monitor site.   Actinic Damage - chronic, secondary to cumulative UV radiation exposure/sun exposure over time - diffuse scaly erythematous macules with underlying dyspigmentation - Recommend daily broad spectrum sunscreen SPF 30+ to sun-exposed areas,  reapply every 2 hours as needed.  - Recommend staying in the shade or wearing long sleeves, sun glasses (UVA+UVB protection) and wide brim hats (4-inch brim around the entire circumference of the hat). - Call for new or changing lesions.  Return in about 3 weeks (around 11/14/2021) for FBSE recheck AK's .  Luther Redo, CMA, am acting as scribe for Forest Gleason, MD .  Documentation: I have reviewed the above documentation for accuracy and completeness, and I agree with the above.  Forest Gleason, MD

## 2021-10-31 ENCOUNTER — Telehealth: Payer: Self-pay

## 2021-10-31 ENCOUNTER — Other Ambulatory Visit: Payer: Self-pay

## 2021-10-31 DIAGNOSIS — C44310 Basal cell carcinoma of skin of unspecified parts of face: Secondary | ICD-10-CM

## 2021-10-31 NOTE — Telephone Encounter (Signed)
Patient advised bx showed BCC, will send referral for Mohs and see if they can add this one to previous referral./js

## 2021-10-31 NOTE — Telephone Encounter (Signed)
-----   Message from Alfonso Patten, MD sent at 10/30/2021  2:08 PM EST ----- Skin , right nasolabial fold BASAL CELL CARCINOMA, NODULAR PATTERN, BASE INVOLVED --> Mohs; please add this to referral to skin surgery center. Priority if there is any way they can do 2 at one visit is to treat this one and and left temple nodular and infiltrative BCC (biopsied 09/12/2021, referral already sent). If they are able to do 3 in one day, would also love for them to do right temple (also from 09/12/2021, referral already sent), as patient says he is not up for more than 1 trip for Mohs.   MAs please call. Thank you!

## 2021-11-03 ENCOUNTER — Encounter: Payer: Self-pay | Admitting: Dermatology

## 2021-11-04 NOTE — Progress Notes (Signed)
Primary Physician/Referring:  Tower, Wynelle Fanny, MD  Patient ID: Jamie Byrd, male    DOB: 22-Mar-1934, 88 y.o.   MRN: [id number]  Chief Complaint  Patient presents with   Coronary Artery Disease   Follow-up   HPI:    Jamie Byrd  is a 88 y.o. Caucasian male with history of hypertension, hypothyroidism, prediabetes, former smoker.  Patient presented to the emergency department 09/25/2021 with chest pain ongoing for the previous 7 days.  Given unstable angina patient underwent coronary angiogram which noted 99% ostial stenosis in the LAD and 80% stenosis of ramus.  Given findings of the angiogram recommended consultation from CVTS for possible CABG, however patient was adamantly against CABG and instead opted for high risk PCI.  Patient underwent successful atherectomy and OCT guided complex PCI (09/26/2021) after patient refused to consider CABG. patient did have right forearm hematoma after cardiac catheterization, which was resolving at discharge.  Patient was discharged with aspirin and Plavix, lisinopril, rosuvastatin, and as needed sublingual nitroglycerin.    Patient presents for 4-week follow-up.  At last office visit patient had not been taking rosuvastatin, therefore resumed this as well as started low-dose beta-blocker therapy with Toprol-XL 12.5 mg daily.  Patient is tolerating addition of Toprol-XL without issue.  He remains asymptomatic, denies chest pain, dyspnea, syncope, near syncope, orthopnea, PND, leg edema.   He is doing chores around the house and taking care of his wife without issue.  Patient's primary concern today is the cost of his medications.  Patient has been unable to afford Plavix, and is therefore been without it for the last 1 to 2 weeks.  Our office clinical pharmacist, Manuela Schwartz, reviewed patient medications and identified [place] would be less expensive.  Past Medical History:  Diagnosis Date   BCC (basal cell carcinoma) 09/12/2021   right  neck posterior   BCC (basal cell carcinoma) 09/12/2021   right temple   BCC (basal cell carcinoma) 09/12/2021   left temple   BCC (basal cell carcinoma) 09/12/2021   left zygoma   BCC (basal cell carcinoma) 10/24/2021   right nasolabial fold   BPH (benign prostatic hypertrophy)    Difficulty reading    Difficulty seeing    Difficulty in seeing certain things and reading/ not legally blind!   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   right neck anterior, well differentiated, base involved   Past Surgical History:  Procedure Laterality Date   COLONOSCOPY     CORONARY ATHERECTOMY N/A 09/26/2021   Procedure: CORONARY ATHERECTOMY;  Surgeon: Nigel Mormon, MD;  Location: [facility];  Service: Cardiovascular;  Laterality: N/A;   CORONARY STENT INTERVENTION N/A 09/26/2021   Procedure: CORONARY STENT INTERVENTION;  Surgeon: Nigel Mormon, MD;  Location: [facility];  Service: Cardiovascular;  Laterality: N/A;   INTRAVASCULAR IMAGING/OCT N/A 09/26/2021   Procedure: INTRAVASCULAR IMAGING/OCT;  Surgeon: Nigel Mormon, MD;  Location: [facility];  Service: Cardiovascular;  Laterality: N/A;   LEFT HEART CATH AND CORONARY ANGIOGRAPHY N/A 09/25/2021   Procedure: LEFT HEART CATH AND CORONARY ANGIOGRAPHY;  Surgeon: Nigel Mormon, MD;  Location: [facility];  Service: Cardiovascular;  Laterality: N/A;   POLYPECTOMY     Family History  Problem Relation Age of Onset   Heart disease Mother  CABG   Heart disease Father        CAD   Cancer Father        lung CA   Cancer Brother        lung CA    Social History   Tobacco Use   Smoking status: Former    Types: Cigarettes    Quit date: 10/13/1968    Years since quitting: 53.1   Smokeless tobacco: Never  Substance Use Topics   Alcohol use: No    Comment: no alcohol in 20 years   Marital  Status: Married   ROS  Review of Systems  Constitutional: Negative for malaise/fatigue and weight gain.  Cardiovascular:  Negative for chest pain, claudication, leg swelling, near-syncope, orthopnea, palpitations, paroxysmal nocturnal dyspnea and syncope.  Respiratory:  Negative for shortness of breath.   Neurological:  Negative for dizziness.   Objective  Blood pressure 136/78, pulse 65, temperature 98 F (36.7 C), temperature source Temporal, height 6' (1.829 m), weight 212 lb (96.2 kg), SpO2 98 %.  Vitals with BMI 11/06/2021 11/06/2021 10/09/2021  Height - 6\' 0"  6\' 0"   Weight - 212 lbs 210 lbs  BMI - 86.76 19.50  Systolic 932 671 245  Diastolic 78 76 80  Pulse 65 68 71      Physical Exam Vitals reviewed.  HENT:     Head: Normocephalic and atraumatic.  Neck:     Vascular: No carotid bruit.  Cardiovascular:     Rate and Rhythm: Normal rate and regular rhythm.     Pulses: Intact distal pulses.     Heart sounds: S1 normal and S2 normal. No murmur heard.   No gallop.  Pulmonary:     Effort: Pulmonary effort is normal. No respiratory distress.     Breath sounds: No wheezing, rhonchi or rales.  Musculoskeletal:     Right lower leg: No edema.     Left lower leg: No edema.  Neurological:     Mental Status: He is alert.    Laboratory examination:   Recent Labs    09/25/21 0334 09/26/21 0115 09/27/21 0326  NA 133* 138 134*  K 3.9 4.6 4.0  CL 101 105 104  CO2 25 27 24   GLUCOSE 94 101* 103*  BUN 11 13 16   CREATININE 0.98 1.08 1.04  CALCIUM 9.3 9.0 8.9  GFRNONAA >60 >60 >60   CrCl cannot be calculated (Patient's most recent lab result is older than the maximum 21 days allowed.).  CMP Latest Ref Rng & Units 09/27/2021 09/26/2021 09/25/2021  Glucose 70 - 99 mg/dL 103(H) 101(H) 94  BUN 8 - 23 mg/dL 16 13 11   Creatinine 0.61 - 1.24 mg/dL 1.04 1.08 0.98  Sodium 135 - 145 mmol/L 134(L) 138 133(L)  Potassium 3.5 - 5.1 mmol/L 4.0 4.6 3.9  Chloride 98 - 111 mmol/L 104 105  101  CO2 22 - 32 mmol/L 24 27 25   Calcium 8.9 - 10.3 mg/dL 8.9 9.0 9.3  Total Protein 6.5 - 8.1 g/dL - - 6.0(L)  Total Bilirubin 0.3 - 1.2 mg/dL - - 1.2  Alkaline Phos 38 - 126 U/L - - 98  AST 15 - 41 U/L - - 25  ALT 0 - 44 U/L - - 15   CBC Latest Ref Rng & Units 09/27/2021 09/26/2021 09/25/2021  WBC 4.0 - 10.5 K/uL 6.3 6.1 5.3  Hemoglobin 13.0 - 17.0 g/dL 11.9(L) 13.0 14.0  Hematocrit 39.0 - 52.0 % 35.4(L) 37.7(L) 42.0  Platelets 150 - 400  K/uL 133(L) 144(L) 146(L)    Lipid Panel Recent Labs    05/23/21 1001 09/25/21 0334  CHOL 116 117  TRIG 139.0 58  LDLCALC 58 72  VLDL 27.8 12  HDL 29.70* 33*  CHOLHDL 4 3.5    HEMOGLOBIN A1C Lab Results  Component Value Date   HGBA1C 5.3 05/23/2021   TSH Recent Labs    05/23/21 1001  TSH 5.52*    External labs:   None  Allergies  No Known Allergies    Medications Prior to Visit:   Outpatient Medications Prior to Visit  Medication Sig Dispense Refill   allopurinol (ZYLOPRIM) 100 MG tablet Take 1 tablet (100 mg total) by mouth daily. 90 tablet 3   aspirin 81 MG EC tablet Take 1 tablet (81 mg total) by mouth daily. Swallow whole. 30 tablet 2   levothyroxine (SYNTHROID) 88 MCG tablet Take 1 tablet (88 mcg total) by mouth daily. 90 tablet 3   metoprolol succinate (TOPROL-XL) 25 MG 24 hr tablet Take 0.5 tablets (12.5 mg total) by mouth daily. Take with or immediately following a meal. 45 tablet 3   nitroGLYCERIN (NITROSTAT) 0.4 MG SL tablet Place 1 tablet (0.4 mg total) under the tongue every 5 (five) minutes as needed for chest pain. 30 tablet 2   rosuvastatin (CRESTOR) 20 MG tablet Take 1 tablet (20 mg total) by mouth daily. 90 tablet 3   lisinopril (ZESTRIL) 10 MG tablet Take 1 tablet (10 mg total) by mouth daily. 90 tablet 3   clopidogrel (PLAVIX) 75 MG tablet Take 1 tablet (75 mg total) by mouth daily. (Patient not taking: Reported on 11/06/2021) 30 tablet 2   No facility-administered medications prior to visit.    Final Medications at End of Visit    Current Meds  Medication Sig   allopurinol (ZYLOPRIM) 100 MG tablet Take 1 tablet (100 mg total) by mouth daily.   aspirin 81 MG EC tablet Take 1 tablet (81 mg total) by mouth daily. Swallow whole.   levothyroxine (SYNTHROID) 88 MCG tablet Take 1 tablet (88 mcg total) by mouth daily.   metoprolol succinate (TOPROL-XL) 25 MG 24 hr tablet Take 0.5 tablets (12.5 mg total) by mouth daily. Take with or immediately following a meal.   nitroGLYCERIN (NITROSTAT) 0.4 MG SL tablet Place 1 tablet (0.4 mg total) under the tongue every 5 (five) minutes as needed for chest pain.   rosuvastatin (CRESTOR) 20 MG tablet Take 1 tablet (20 mg total) by mouth daily.   [DISCONTINUED] lisinopril (ZESTRIL) 10 MG tablet Take 1 tablet (10 mg total) by mouth daily.   Radiology:   No results found.  Cardiac Studies:   Coronary intervention 08-Oct-2021: Coronary intervention: 80% mid high diag stenosis.  99% ostial LAD stenosis Successful percutaneous coronary intervention mid high diag and ostial LAD        PTCA and overlapping stents placement high diag        3.0 X 22 mm Onyx Frontier drug-eluting stent        3.0 X 15 mm Onyx Frontier drug-eluting stent         Post dilatation with 3.25 mm [place] balloon        80%--->0% residual stenosis. TIMI flow II-->III        PTCA and overlapping stents placement ostial LAD        3.0 X 15 mm Onyx Frontier drug-eluting stent        Post dilatation with 3.5 mm [place] balloon  99%--->0% residual stenosis. TIMI flow II-->III   Coronary angiogram 09/25/2021: LM: Normal LAD: Ostial 99% stenosis with severe calcification Ramus: Mid 80% eccentric stenosis with severe calcification Lcx: Small OM1 with 50% disease        Large OM1 with mid 30% disease RCA: Prox 50% stenosis with severe calcification   LVEDP normal LVEF normal on echocardiogram   Given location of the ostial LAD lesion with severe calcification with a large ramus  arising at an acute angle with respect to LAD.  Will consult CVTS for possible CABG to LAD and ramus Limiting factors could be age and aorta calcification. Also, patient is the primary caretaker to his 90 y/o wife with dementia. He does not have living children, but has brothers and sisters in their 72s. I spoke with his sister Geralyn Corwin [phone])   If not a surgical candidate, could consider high risk PCI ostial LAD, along with PCI to ramus   Echocardiogram 09/25/2021:  1. Left ventricular ejection fraction, by estimation, is 50 to 55%. The  left ventricle has low normal function. The anterior septum is hypokinetic.  Left ventricular diastolic parameters were normal.   2. Right ventricular systolic function is normal. The right ventricular  size is normal.   3. The mitral valve is normal in structure. No evidence of mitral valve  regurgitation. No evidence of mitral stenosis.   4. The aortic valve is tricuspid. Aortic valve regurgitation is not  visualized. No aortic stenosis is present.   5. There is mild dilatation of the aortic root, measuring 43 mm.   EKG:   10/01/2021: Sinus rhythm rate of 64 bpm.  Normal axis.  Anteroseptal infarct old.  EKG 09/27/2021: Sinus rhythm Occasional PAC Septal infarct, age indeterminate  Assessment     ICD-10-CM   1. Coronary artery disease of native artery of native heart with stable angina pectoris ([place])  I25.118 PCV ECHOCARDIOGRAM COMPLETE    2. Essential hypertension  I10     3. Dilated aortic root (HCC)  I77.810 PCV ECHOCARDIOGRAM COMPLETE       Medications Discontinued During This Encounter  Medication Reason   clopidogrel (PLAVIX) 75 MG tablet Reorder   lisinopril (ZESTRIL) 10 MG tablet Reorder   lisinopril (ZESTRIL) 10 MG tablet Reorder    Meds ordered this encounter  Medications   clopidogrel (PLAVIX) 75 MG tablet    Sig: Take 1 tablet (75 mg total) by mouth daily.    Dispense:  30 tablet    Refill:  2   DISCONTD:  lisinopril (ZESTRIL) 10 MG tablet    Sig: Take 1 tablet (10 mg total) by mouth daily.    Dispense:  90 tablet    Refill:  3   lisinopril (ZESTRIL) 10 MG tablet    Sig: Take 1 tablet (10 mg total) by mouth daily.    Dispense:  90 tablet    Refill:  3    Recommendations:   DAVAN NAWABI is a 88 y.o. Caucasian male with history of hypertension, hypothyroidism, prediabetes, former smoker.  Presented with unstable angina 09/25/2021 with high grade 99% ostial LAD lesion and another 80% lesion in ramus/high diag. He underwent successful atherectomy and OCT guided complex PCI (09/26/2021) after patient refused to consider CABG.  CAD: Continue dual antiplatelet therapy with aspirin and Plavix until at least 09/2022 Patient has been without Plavix for the last 1 to 2 weeks.  I have refilled this at [facility], which is more formal for the patient.  Patient  will take loading dose of Plavix today, then continue with 75 mg once daily. He has resumed rosuvastatin and is tolerating this without issue, will continue. Continue Toprol-XL 12.5 mg daily, continue lisinopril 10 mg daily Echo noted hypokinetic anterior septum, will repeat echocardiogram in March   Hypertension: Blood pressure was initially elevated in the office, but it is well controlled upon recheck.  Continue lisinopril, metoprolol  Mild aortic root dilation: There is mild dilatation of the aortic root, measuring 43 mm on echocardiogram 09/25/2021. Will repeat echocardiogram in March given hypokinetic anterior septum, will continue to monitor aortic root. Discussed with patient transitioning from lisinopril to losartan given aortic root dilation, however patient is having difficulty affording his medications and lisinopril is less expensive.  Therefore shared decision was to continue lisinopril at this time.  Follow up in 4 months, sooner if needed.    Alethia Berthold, PA-C 11/06/2021, 9:36 AM Office: [phone]

## 2021-11-06 ENCOUNTER — Other Ambulatory Visit: Payer: Self-pay

## 2021-11-06 ENCOUNTER — Ambulatory Visit: Payer: Medicare Other | Admitting: Student

## 2021-11-06 ENCOUNTER — Encounter: Payer: Self-pay | Admitting: Student

## 2021-11-06 VITALS — BP 136/78 | HR 65 | Temp 98.0°F | Ht 72.0 in | Wt 212.0 lb

## 2021-11-06 DIAGNOSIS — I77810 Thoracic aortic ectasia: Secondary | ICD-10-CM | POA: Diagnosis not present

## 2021-11-06 DIAGNOSIS — I25118 Atherosclerotic heart disease of native coronary artery with other forms of angina pectoris: Secondary | ICD-10-CM

## 2021-11-06 DIAGNOSIS — I10 Essential (primary) hypertension: Secondary | ICD-10-CM

## 2021-11-06 MED ORDER — LISINOPRIL 20 MG PO TABS: 20.0000 mg | ORAL_TABLET | Freq: Every day | ORAL | 3 refills | Status: DC

## 2021-11-06 MED ORDER — LISINOPRIL 10 MG PO TABS: 10.0000 mg | ORAL_TABLET | Freq: Every day | ORAL | 3 refills | Status: DC

## 2021-11-06 MED ORDER — CLOPIDOGREL BISULFATE 75 MG PO TABS: 75.0000 mg | ORAL_TABLET | Freq: Every day | ORAL | 2 refills | Status: DC

## 2021-11-06 MED ORDER — LISINOPRIL 10 MG PO TABS
10.0000 mg | ORAL_TABLET | Freq: Every day | ORAL | 3 refills | Status: DC
Start: 2021-11-06 — End: 2021-11-06

## 2021-11-12 ENCOUNTER — Telehealth: Payer: Self-pay

## 2021-11-12 NOTE — Telephone Encounter (Signed)
Spoke with [facility] this morning. Patient has been scheduled for [facility] on the [facility] for 12/18/21 at 10:30 with Dr. Winifred Olive. The second [facility]OH[facility] surgery site is still pending appt. aw

## 2021-11-19 DIAGNOSIS — H35433 Paving stone degeneration of retina, bilateral: Secondary | ICD-10-CM | POA: Diagnosis not present

## 2021-11-19 DIAGNOSIS — H353134 Nonexudative age-related macular degeneration, bilateral, advanced atrophic with subfoveal involvement: Secondary | ICD-10-CM | POA: Diagnosis not present

## 2021-11-19 DIAGNOSIS — H35423 Microcystoid degeneration of retina, bilateral: Secondary | ICD-10-CM | POA: Diagnosis not present

## 2021-11-19 DIAGNOSIS — H31092 Other chorioretinal scars, left eye: Secondary | ICD-10-CM | POA: Diagnosis not present

## 2021-11-21 ENCOUNTER — Encounter: Payer: Self-pay | Admitting: Dermatology

## 2021-11-21 ENCOUNTER — Other Ambulatory Visit: Payer: Self-pay

## 2021-11-21 ENCOUNTER — Ambulatory Visit (INDEPENDENT_AMBULATORY_CARE_PROVIDER_SITE_OTHER): Payer: Medicare Other | Admitting: Dermatology

## 2021-11-21 DIAGNOSIS — D485 Neoplasm of uncertain behavior of skin: Secondary | ICD-10-CM

## 2021-11-21 DIAGNOSIS — L821 Other seborrheic keratosis: Secondary | ICD-10-CM | POA: Diagnosis not present

## 2021-11-21 DIAGNOSIS — D229 Melanocytic nevi, unspecified: Secondary | ICD-10-CM | POA: Diagnosis not present

## 2021-11-21 DIAGNOSIS — C44622 Squamous cell carcinoma of skin of right upper limb, including shoulder: Secondary | ICD-10-CM | POA: Diagnosis not present

## 2021-11-21 DIAGNOSIS — L570 Actinic keratosis: Secondary | ICD-10-CM | POA: Diagnosis not present

## 2021-11-21 DIAGNOSIS — C4492 Squamous cell carcinoma of skin, unspecified: Secondary | ICD-10-CM

## 2021-11-21 DIAGNOSIS — D1800 Hemangioma unspecified site: Secondary | ICD-10-CM | POA: Diagnosis not present

## 2021-11-21 DIAGNOSIS — L814 Other melanin hyperpigmentation: Secondary | ICD-10-CM

## 2021-11-21 DIAGNOSIS — Z1283 Encounter for screening for malignant neoplasm of skin: Secondary | ICD-10-CM | POA: Diagnosis not present

## 2021-11-21 DIAGNOSIS — L905 Scar conditions and fibrosis of skin: Secondary | ICD-10-CM

## 2021-11-21 DIAGNOSIS — C4441 Basal cell carcinoma of skin of scalp and neck: Secondary | ICD-10-CM

## 2021-11-21 HISTORY — DX: Squamous cell carcinoma of skin, unspecified: C44.92

## 2021-11-21 NOTE — Patient Instructions (Addendum)
[facility] [address] [place] March 8th at 10:30 am with Dr. Winifred Olive   Wound Care Instructions  Cleanse wound gently with soap and water once a day then pat dry with clean gauze. Apply a thing coat of Petrolatum (petroleum jelly, "Vaseline") over the wound (unless you have an allergy to this). We recommend that you use a new, sterile tube of Vaseline. Do not pick or remove scabs. Do not remove the yellow or white "healing tissue" from the base of the wound.  Cover the wound with fresh, clean, nonstick gauze and secure with paper tape. You may use Band-Aids in place of gauze and tape if the would is small enough, but would recommend trimming much of the tape off as there is often too much. Sometimes Band-Aids can irritate the skin.  You should call the office for your biopsy report after 1 week if you have not already been contacted.  If you experience any problems, such as abnormal amounts of bleeding, swelling, significant bruising, significant pain, or evidence of infection, please call the office immediately.  FOR ADULT SURGERY PATIENTS: If you need something for pain relief you may take 1 extra strength Tylenol (acetaminophen) AND 2 Ibuprofen (200mg  each) together every 4 hours as needed for pain. (do not take these if you are allergic to them or if you have a reason you should not take them.) Typically, you may only need pain medication for 1 to 3 days.   Cryotherapy Aftercare  Wash gently with soap and water everyday.   Apply Vaseline and Band-Aid daily until healed.   Prior to procedure, discussed risks of blister formation, small wound, skin dyspigmentation, or rare scar following cryotherapy. Recommend Vaseline ointment to treated areas while healing.   If You Need Anything After Your Visit  If you have any questions or concerns for your doctor, please call our main line at [phone] and press option 4 to reach your doctor's  medical assistant. If no one answers, please leave a voicemail as directed and we will return your call as soon as possible. Messages left after 4 pm will be answered the following business day.   You may also send [place] a message via [place]. We typically respond to MyChart messages within 1-2 business days.  For prescription refills, please ask your pharmacy to contact our office. Our fax number is [phone].  If you have an urgent issue when the clinic is closed that cannot wait until the next business day, you can page your doctor at the number below.    Please note that while we do our best to be available for urgent issues outside of office hours, we are not available 24/7.   If you have an urgent issue and are unable to reach [place], you may choose to seek medical care at your doctor's office, retail clinic, urgent care center, or emergency room.  If you have a medical emergency, please immediately call 911 or go to the emergency department.  Pager Numbers  - Dr. Nehemiah Massed: [phone]  - Dr. Laurence Ferrari: [phone]  - Dr. Nicole Kindred: [phone]  In the event of inclement weather, please call our main line at [phone] for an update on the status of any delays or closures.  Dermatology Medication Tips: Please keep the boxes that topical medications come in in order to help keep track of the instructions about where and how to use these. Pharmacies typically print the medication instructions only on the boxes and not  directly on the medication tubes.   If your medication is too expensive, please contact our office at [phone] option 4 or send [place] a message through [place].   We are unable to tell what your co-pay for medications will be in advance as this is different depending on your insurance coverage. However, we may be able to find a substitute medication at lower cost or fill out paperwork to get insurance to cover a needed medication.   If a prior authorization is required to  get your medication covered by your insurance company, please allow [place] 1-2 business days to complete this process.  Drug prices often vary depending on where the prescription is filled and some pharmacies may offer cheaper prices.  The website www.goodrx.com contains coupons for medications through different pharmacies. The prices here do not account for what the cost may be with help from insurance (it may be cheaper with your insurance), but the website can give you the price if you did not use any insurance.  - You can print the associated coupon and take it with your prescription to the pharmacy.  - You may also stop by our office during regular business hours and pick up a GoodRx coupon card.  - If you need your prescription sent electronically to a different pharmacy, notify our office through [facility] or by phone at [phone] option 4.     Si Usted Necesita Algo Despus de Su Visita  Tambin puede enviarnos un mensaje a travs de Pharmacist, community. Por lo general respondemos a los mensajes de MyChart en el transcurso de 1 a 2 das hbiles.  Para renovar recetas, por favor pida a su farmacia que se ponga en contacto con nuestra oficina. Harland Dingwall de fax es [place] [phone].  Si tiene un asunto urgente cuando la clnica est cerrada y que no puede esperar hasta el siguiente da hbil, puede llamar/localizar a su doctor(a) al nmero que aparece a continuacin.   Por favor, tenga en cuenta que aunque hacemos todo lo posible para estar disponibles para asuntos urgentes fuera del horario de [place], no estamos disponibles las 24 horas del da, los 7 das de la [place].   Si tiene un problema urgente y no puede comunicarse con nosotros, puede optar por buscar atencin mdica  en el consultorio de su doctor(a), en una clnica privada, en un centro de atencin urgente o en una sala de emergencias.  Si tiene Engineering geologist, por favor llame inmediatamente al 911 o vaya a la sala de  emergencias.  Nmeros de bper  - Dr. Nehemiah Massed: [phone]  - Dra. Moye: [phone]  - Dra. Nicole Kindred: [phone]  En caso de inclemencias del [place], por favor llame a Johnsie Kindred principal al [phone] para una actualizacin sobre el [place] de cualquier retraso o cierre.  Consejos para la medicacin en dermatologa: Por favor, guarde las cajas en las que vienen los medicamentos de uso tpico para ayudarle a seguir las instrucciones sobre dnde y cmo usarlos. Las farmacias generalmente imprimen las instrucciones del medicamento slo en las cajas y no directamente en los tubos del [place].   Si su medicamento es muy caro, por favor, pngase en contacto con Zigmund Daniel llamando al [phone] y presione la opcin 4 o envenos un mensaje a travs de Pharmacist, community.   No podemos decirle cul ser su copago por los medicamentos por adelantado ya que esto es diferente dependiendo de la cobertura de su seguro. Sin embargo, es posible que podamos encontrar un medicamento  sustituto a Electrical engineer un formulario para que el seguro cubra el medicamento que se considera necesario.   Si se requiere una autorizacin previa para que su compaa de seguros [place] su medicamento, por favor permtanos de 1 a 2 das hbiles para completar este proceso.  Los precios de los medicamentos varan con frecuencia dependiendo del Environmental consultant de dnde se surte la receta y alguna farmacias pueden ofrecer precios ms baratos.  El sitio web www.goodrx.com tiene cupones para medicamentos de Airline pilot. Los precios aqu no tienen en cuenta lo que podra costar con la ayuda del seguro (puede ser ms barato con su seguro), pero el sitio web puede darle el precio si no utiliz Research scientist (physical sciences).  - Puede imprimir el cupn correspondiente y llevarlo con su receta a la farmacia.  - Tambin puede pasar por nuestra oficina durante el horario de atencin regular y Charity fundraiser una tarjeta de cupones de GoodRx.  - Si  necesita que su receta se enve electrnicamente a una farmacia diferente, informe a nuestra oficina a travs de MyChart de [place] o por telfono llamando al [phone] y presione la opcin 4.

## 2021-11-21 NOTE — Progress Notes (Signed)
Follow-Up Visit   Subjective  Jamie Byrd is a 88 y.o. male who presents for the following: Follow-up (Patient here today for AK follow up. Patient does have a spot at right hand and one at each arm. Patient defers FBSE today but agreed to UBSE. ).   The following portions of the chart were reviewed this encounter and updated as appropriate:   Tobacco   Allergies   Meds   Problems   Med Hx   Surg Hx   Fam Hx       Review of Systems:  No other skin or systemic complaints except as noted in HPI or Assessment and Plan.  Objective  Well appearing patient in no apparent distress; mood and affect are within normal limits.  All skin waist up examined.  right dorsal hand R/o SCC 0.8 cm firm papule with keratotic center     Right Forearm 0.6 cm pink papule R/o SCC vs BCC     Right Neck Posterior Pink plaque   Right Neck Anterior Pink plaque   left dorsal hand x 5, left wrist x 1, left antecubital fossa x 1, right dorsal hand x 2, right forearm x 3, right 3rd finger x 1 (13) Erythematous thin papules/macules with gritty scale.     Assessment & Plan  Neoplasm of uncertain behavior of skin (2) right dorsal hand  Epidermal / dermal shaving  Lesion diameter (cm):  0.8 Informed consent: discussed and consent obtained   Timeout: patient name, date of birth, surgical site, and procedure verified   Patient was prepped and draped in usual sterile fashion: area prepped with isopropyl alcohol. Anesthesia: the lesion was anesthetized in a standard fashion   Anesthetic:  1% lidocaine w/ epinephrine 1-100,000 buffered w/ 8.4% NaHCO3 Instrument used: DermaBlade   Hemostasis achieved with: aluminum chloride   Outcome: patient tolerated procedure well   Post-procedure details: wound care instructions given   Additional details:  Mupirocin and a bandage applied  Destruction of lesion  Destruction method: electrodesiccation and curettage   Informed consent: discussed and  consent obtained   Timeout:  patient name, date of birth, surgical site, and procedure verified Patient was prepped and draped in usual sterile fashion: area prepped with isopropyl alcohol. Anesthesia: the lesion was anesthetized in a standard fashion   Anesthetic:  1% lidocaine w/ epinephrine 1-100,000 buffered w/ 8.4% NaHCO3 Curettage performed in three different directions: Yes   Electrodesiccation performed over the curetted area: Yes   Curettage cycles:  3 Final wound size (cm):  1.1 Hemostasis achieved with:  electrodesiccation Outcome: patient tolerated procedure well with no complications   Post-procedure details: wound care instructions given   Additional details:  Mupirocin and a pressure dressing applied  Specimen 1 - Surgical pathology Differential Diagnosis: R/o SCC  Check Margins: No 0.8 cm firm papule with keratotic center R/o SCC 0.8 cm firm papule with keratotic center  Right Forearm  Skin / nail biopsy Type of biopsy: tangential   Informed consent: discussed and consent obtained   Timeout: patient name, date of birth, surgical site, and procedure verified   Patient was prepped and draped in usual sterile fashion: Area prepped with isopropyl alcohol. Anesthesia: the lesion was anesthetized in a standard fashion   Anesthetic:  1% lidocaine w/ epinephrine 1-100,000 buffered w/ 8.4% NaHCO3 Instrument used: DermaBlade   Hemostasis achieved with: aluminum chloride   Outcome: patient tolerated procedure well   Post-procedure details: wound care instructions given   Additional details:  Mupirocin  and a bandage applied  Specimen 2 - Surgical pathology Differential Diagnosis: R/o SCC vs BCC  Check Margins: No 0.6 cm pink papule   Basal cell carcinoma (BCC) of skin of neck Right Neck Posterior  Destruction of lesion  Destruction method: electrodesiccation and curettage   Informed consent: discussed and consent obtained   Timeout:  patient name, date of birth,  surgical site, and procedure verified Patient was prepped and draped in usual sterile fashion: area prepped with isopropyl alcohol. Anesthesia: the lesion was anesthetized in a standard fashion   Anesthetic:  1% lidocaine w/ epinephrine 1-100,000 buffered w/ 8.4% NaHCO3 Curettage performed in three different directions: Yes   Electrodesiccation performed over the curetted area: Yes   Curettage cycles:  3 Final wound size (cm):  1.7 Hemostasis achieved with:  electrodesiccation Outcome: patient tolerated procedure well with no complications   Post-procedure details: wound care instructions given   Additional details:  Mupirocin and a pressure dressing applied  Squamous cell carcinoma of skin Right Neck Anterior  Destruction of lesion  Destruction method: electrodesiccation and curettage   Informed consent: discussed and consent obtained   Timeout:  patient name, date of birth, surgical site, and procedure verified Patient was prepped and draped in usual sterile fashion: area prepped with isopropyl alcohol. Anesthesia: the lesion was anesthetized in a standard fashion   Anesthetic:  1% lidocaine w/ epinephrine 1-100,000 buffered w/ 8.4% NaHCO3 Curettage performed in three different directions: Yes   Electrodesiccation performed over the curetted area: Yes   Curettage cycles:  3 Final wound size (cm):  1.3 Hemostasis achieved with:  electrodesiccation Outcome: patient tolerated procedure well with no complications   Post-procedure details: wound care instructions given   Additional details:  Mupirocin and a pressure dressing applied  AK (actinic keratosis) (13) left dorsal hand x 5, left wrist x 1, left antecubital fossa x 1, right dorsal hand x 2, right forearm x 3, right 3rd finger x 1  hypertrophic  Prior to procedure, discussed risks of blister formation, small wound, skin dyspigmentation, or rare scar following cryotherapy. Recommend Vaseline ointment to treated areas while  healing.   Actinic keratoses are precancerous spots that appear secondary to cumulative UV radiation exposure/sun exposure over time. They are chronic with expected duration over 1 year. A portion of actinic keratoses will progress to squamous cell carcinoma of the skin. It is not possible to reliably predict which spots will progress to skin cancer and so treatment is recommended to prevent development of skin cancer.  Recommend daily broad spectrum sunscreen SPF 30+ to sun-exposed areas, reapply every 2 hours as needed.  Recommend staying in the shade or wearing long sleeves, sun glasses (UVA+UVB protection) and wide brim hats (4-inch brim around the entire circumference of the hat). Call for new or changing lesions.   Destruction of lesion - left dorsal hand x 5, left wrist x 1, left antecubital fossa x 1, right dorsal hand x 2, right forearm x 3, right 3rd finger x 1  Destruction method: cryotherapy   Informed consent: discussed and consent obtained   Lesion destroyed using liquid nitrogen: Yes   Cryotherapy cycles:  2 Outcome: patient tolerated procedure well with no complications   Post-procedure details: wound care instructions given     Lentigines - Scattered tan macules - Due to sun exposure - Benign-appearing, observe - Recommend daily broad spectrum sunscreen SPF 30+ to sun-exposed areas, reapply every 2 hours as needed. - Call for any changes  Seborrheic Keratoses -  Stuck-on, waxy, tan-brown papules and/or plaques  - Benign-appearing - Discussed benign etiology and prognosis. - Observe - Call for any changes  Melanocytic Nevi - Tan-brown and/or pink-flesh-colored symmetric macules and papules - Benign appearing on exam today - Observation - Call clinic for new or changing moles - Recommend daily use of broad spectrum spf 30+ sunscreen to sun-exposed areas.   Hemangiomas - Red papules - Discussed benign nature - Observe - Call for any changes  Actinic  Damage - Chronic condition, secondary to cumulative UV/sun exposure - diffuse scaly erythematous macules with underlying dyspigmentation - Recommend daily broad spectrum sunscreen SPF 30+ to sun-exposed areas, reapply every 2 hours as needed.  - Staying in the shade or wearing long sleeves, sun glasses (UVA+UVB protection) and wide brim hats (4-inch brim around the entire circumference of the hat) are also recommended for sun protection.  - Call for new or changing lesions.  Skin cancer screening performed today.  Return in about 4 weeks (around 12/19/2021) for [facility].  Graciella Belton, RMA, am acting as scribe for Forest Gleason, MD .  Documentation: I have reviewed the above documentation for accuracy and completeness, and I agree with the above.  Forest Gleason, MD

## 2021-11-25 ENCOUNTER — Telehealth: Payer: Self-pay

## 2021-11-25 NOTE — Telephone Encounter (Signed)
Advised pt of bx results/sh ?

## 2021-11-25 NOTE — Telephone Encounter (Signed)
-----   Message from Alfonso Patten, MD sent at 11/25/2021  4:09 PM EST ----- 1. Skin , right dorsal hand WELL DIFFERENTIATED SQUAMOUS CELL CARCINOMA, BASE INVOLVED --> already treated with [facility] will monitor for recurrence  2. Skin , right foerarm DERMAL SCAR --> scar with no evidence of skin cancer no treatment needed  MAs please call. Thank you!

## 2021-11-30 ENCOUNTER — Encounter: Payer: Self-pay | Admitting: Dermatology

## 2021-12-18 DIAGNOSIS — C44319 Basal cell carcinoma of skin of other parts of face: Secondary | ICD-10-CM | POA: Diagnosis not present

## 2021-12-18 DIAGNOSIS — C4442 Squamous cell carcinoma of skin of scalp and neck: Secondary | ICD-10-CM | POA: Diagnosis not present

## 2021-12-19 ENCOUNTER — Other Ambulatory Visit: Payer: Self-pay

## 2021-12-19 ENCOUNTER — Ambulatory Visit: Payer: Medicare Other | Admitting: Dermatology

## 2021-12-23 ENCOUNTER — Telehealth: Payer: Self-pay | Admitting: Dermatology

## 2021-12-23 NOTE — Telephone Encounter (Signed)
I spoke with Dr. Glori Bickers who was ok with [place] starting niacinamide for Jamie Byrd, and she will help [place] keep an eye on his blood sugar levels.  ? ?Please call patient and recommend Niacinamide or Nicotinamide '500mg'$  twice per day to lower risk of non-melanoma skin cancer by approximately 25%. This is usually available at [facility]. ? ?Thank you! ?

## 2021-12-30 ENCOUNTER — Ambulatory Visit: Payer: Medicare Other

## 2021-12-30 ENCOUNTER — Other Ambulatory Visit: Payer: Self-pay

## 2021-12-30 DIAGNOSIS — I77810 Thoracic aortic ectasia: Secondary | ICD-10-CM | POA: Diagnosis not present

## 2021-12-30 DIAGNOSIS — I25118 Atherosclerotic heart disease of native coronary artery with other forms of angina pectoris: Secondary | ICD-10-CM

## 2022-01-01 DIAGNOSIS — C44319 Basal cell carcinoma of skin of other parts of face: Secondary | ICD-10-CM | POA: Diagnosis not present

## 2022-01-02 ENCOUNTER — Telehealth: Payer: Self-pay

## 2022-01-02 NOTE — Telephone Encounter (Signed)
Updating specimen tracking and history from [facility] progress notes and photos. aw ?

## 2022-01-03 NOTE — Progress Notes (Signed)
Patient called back, I have discussed echo with him.

## 2022-01-03 NOTE — Progress Notes (Signed)
Tried calling patient no answer left a vm

## 2022-01-06 ENCOUNTER — Telehealth: Payer: Self-pay

## 2022-01-14 NOTE — Telephone Encounter (Signed)
VM 110 :  ? ?Patient called, no message left. I called patient back, he said it may have been a mistake.  ?

## 2022-01-21 ENCOUNTER — Telehealth: Payer: Self-pay

## 2022-01-21 NOTE — Telephone Encounter (Signed)
Updated history and specimen tracking with updated MOHs progress notes. aw ?

## 2022-01-21 NOTE — Telephone Encounter (Signed)
Patient is scheduled to see Dr. Nicole Kindred next month. Note has been made to advise patient Dr. Glori Bickers has approved nicotinamide per Dr. Robbi Garter message from 12/23/21. Patient does not drive and it will be easier to explain to him in person.  ?Lurlean Horns., RMA ?

## 2022-02-03 DIAGNOSIS — D485 Neoplasm of uncertain behavior of skin: Secondary | ICD-10-CM | POA: Diagnosis not present

## 2022-02-03 DIAGNOSIS — L708 Other acne: Secondary | ICD-10-CM | POA: Diagnosis not present

## 2022-02-03 DIAGNOSIS — L905 Scar conditions and fibrosis of skin: Secondary | ICD-10-CM | POA: Diagnosis not present

## 2022-02-12 ENCOUNTER — Other Ambulatory Visit: Payer: Self-pay | Admitting: Cardiology

## 2022-02-12 ENCOUNTER — Ambulatory Visit (INDEPENDENT_AMBULATORY_CARE_PROVIDER_SITE_OTHER): Payer: Medicare Other

## 2022-02-12 ENCOUNTER — Telehealth: Payer: Self-pay | Admitting: Family Medicine

## 2022-02-12 VITALS — Wt 212.0 lb

## 2022-02-12 DIAGNOSIS — Z0000 Encounter for general adult medical examination without abnormal findings: Secondary | ICD-10-CM

## 2022-02-12 DIAGNOSIS — Z5982 Transportation insecurity: Secondary | ICD-10-CM

## 2022-02-12 NOTE — Progress Notes (Signed)
? ?Subjective:  ? Jamie Byrd is a 88 y.o. male who presents for Medicare Annual/Subsequent preventive examination. ? ?Virtual Visit via Telephone Note ? ?I connected with  Jamie Byrd on 02/12/22 at  8:15 AM EDT by telephone and verified that I am speaking with the correct person using two identifiers. ? ?Location: ?Patient: Home ?Provider: [facility] ?Persons participating in the virtual visit: patient/Nurse Health Advisor ?  ?I discussed the limitations, risks, security and privacy concerns of performing an evaluation and management service by telephone and the availability of in person appointments. The patient expressed understanding and agreed to proceed. ? ?Interactive audio and video telecommunications were attempted between this nurse and patient, however failed, due to patient having technical difficulties OR patient did not have access to video capability.  We continued and completed visit with audio only. ? ?Some vital signs may be absent or patient reported.  ? ?Jamie Liou Dionne Ano, LPN  ? ?Review of Systems    ? ?Cardiac Risk Factors include: advanced age (>57mn, >>39women);male gender;hypertension ? ?   ?Objective:  ?  ?Today's Vitals  ? 02/12/22 0818  ?Weight: 212 lb (96.2 kg)  ? ?Body mass index is 28.75 kg/m?. ? ? ?  02/12/2022  ?  8:37 AM 05/09/2019  ?  2:03 PM 03/15/2019  ? 10:07 AM 04/29/2018  ?  9:41 AM 12/18/2017  ? 10:04 AM 03/23/2017  ?  2:45 PM 12/17/2016  ?  9:28 AM  ?Advanced Directives  ?Does Patient Have a Medical Advance Directive? Yes No No No No No No  ?Type of AParamed[place]of AMinaLiving will        ?[place] of [place]? No - copy requested        ?Would patient like information on creating a medical advance directive?   No - Patient declined  Yes (MAU/Ambulatory/Procedural Areas - Information given) No - Patient declined   ? ? ?Current Medications (verified) ?Outpatient Encounter Medications as of 02/12/2022  ?Medication Sig  ? allopurinol  (ZYLOPRIM) 100 MG tablet Take 1 tablet (100 mg total) by mouth daily.  ? aspirin 81 MG EC tablet Take 1 tablet (81 mg total) by mouth daily. Swallow whole.  ? clopidogrel (PLAVIX) 75 MG tablet Take 1 tablet (75 mg total) by mouth daily.  ? levothyroxine (SYNTHROID) 88 MCG tablet Take 1 tablet (88 mcg total) by mouth daily.  ? rosuvastatin (CRESTOR) 20 MG tablet Take 1 tablet (20 mg total) by mouth daily.  ? lisinopril (ZESTRIL) 20 MG tablet Take 1 tablet (20 mg total) by mouth daily.  ? metoprolol succinate (TOPROL-XL) 25 MG 24 hr tablet Take 0.5 tablets (12.5 mg total) by mouth daily. Take with or immediately following a meal.  ? nitroGLYCERIN (NITROSTAT) 0.4 MG SL tablet Place 1 tablet (0.4 mg total) under the tongue every 5 (five) minutes as needed for chest pain.  ? ?No facility-administered encounter medications on file as of 02/12/2022.  ? ? ?Allergies (verified) ?Patient has no known allergies.  ? ?History: ?Past Medical History:  ?Diagnosis Date  ? BCC (basal cell carcinoma) 09/12/2021  ? right neck posterior EDC 11/21/2021  ? BCC (basal cell carcinoma) 09/12/2021  ? right temple  ? BCC (basal cell carcinoma) 09/12/2021  ? left temple, Mohs 12/18/21  ? BCC (basal cell carcinoma) 09/12/2021  ? left zygoma  ? BCC (basal cell carcinoma) 10/24/2021  ? right nasolabial fold, MOHs 01/01/2022  ? BPH (benign prostatic hypertrophy)   ?  Difficulty reading   ? Difficulty seeing   ? Difficulty in seeing certain things and reading/ not legally blind!  ? Gout   ? History of shingles   ? Hypertension   ? Hypothyroidism   ? Macular degeneration   ? 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 right neck anterior, well differentiated, base involved EDC 11/21/2021  ? Squamous cell carcinoma of skin 11/21/2021  ? right dorsal hand, EDC  ? ?Past Surgical History:  ?Procedure Laterality Date  ? COLONOSCOPY    ? CORONARY ATHERECTOMY N/A 09/26/2021  ? Procedure: CORONARY ATHERECTOMY;  Surgeon: Nigel Mormon, MD;  Location: [facility];  Service: Cardiovascular;  Laterality: N/A;  ? CORONARY STENT INTERVENTION N/A 09/26/2021  ? Procedure: CORONARY STENT INTERVENTION;  Surgeon: Nigel Mormon, MD;  Location: [facility];  Service: Cardiovascular;  Laterality: N/A;  ? INTRAVASCULAR IMAGING/OCT N/A 09/26/2021  ? Procedure: INTRAVASCULAR IMAGING/OCT;  Surgeon: Nigel Mormon, MD;  Location: [facility];  Service: Cardiovascular;  Laterality: N/A;  ? LEFT HEART CATH AND CORONARY ANGIOGRAPHY N/A 09/25/2021  ? Procedure: LEFT HEART CATH AND CORONARY ANGIOGRAPHY;  Surgeon: Nigel Mormon, MD;  Location: [facility];  Service: Cardiovascular;  Laterality: N/A;  ? POLYPECTOMY    ? ?Family History  ?Problem Relation Age of Onset  ? Heart disease Mother   ?     CABG  ? Heart disease Father   ?     CAD  ? Cancer Father   ?     lung CA  ? Cancer Brother   ?     lung CA  ? ?Social History  ? ?Socioeconomic History  ? Marital status: Married  ?  Spouse name: Jamie Byrd  ? Number of children: 1  ? Years of education: Not on file  ? Highest education level: Not on file  ?Occupational History  ? Occupation: retired  ?Tobacco Use  ? Smoking status: Former  ?  Types: Cigarettes  ?  Quit date: 10/13/1968  ?  Years since quitting: 53.3  ? Smokeless tobacco: Never  ?Vaping Use  ? Vaping Use: Never used  ?Substance and Sexual Activity  ? Alcohol use: No  ?  Comment: no alcohol in 20 years  ? Drug use: No  ? Sexual activity: Not Currently  ?Other Topics Concern  ? Not on file  ?Social History Narrative  ? Had one son who passed away in MVA at age 64  ? He and his wife are retired and disabled, he is blind and she has dementia - his sister drives them to appointments and shopping, etc   ? ?Social Determinants of Health  ? ?Financial Resource Strain: Low Risk   ? Difficulty of Paying Living Expenses: Not very hard  ?Food Insecurity: No Food Insecurity  ? Worried About Charity fundraiser in the Last Year: Never true  ? Ran Out  of Food in the Last Year: Never true  ?Transportation Needs: Unmet Transportation Needs  ? Lack of Transportation (Medical): Yes  ? Lack of Transportation (Non-Medical): Yes  ?Physical Activity: Insufficiently Active  ? Days of Exercise per Week: 7 days  ? Minutes of Exercise per Session: 20 min  ?Stress: No Stress Concern Present  ? Feeling of Stress : Not at all  ?Social Connections: Socially Isolated  ? Frequency of Communication with Friends and Family: Once a week  ? Frequency of Social Gatherings with Friends and Family: Once a  week  ? Attends Religious Services: Never  ? Active Member of Clubs or Organizations: No  ? Attends Archivist Meetings: Never  ? Marital Status: Married  ? ? ?Tobacco Counseling ?Counseling given: Not Answered ? ? ?Clinical Intake: ? ?Pre-visit preparation completed: Yes ? ?Pain : No/denies pain ? ?  ? ?BMI - recorded: 28.75 ?Nutritional Status: BMI 25 -29 Overweight ?Nutritional Risks: None ?Diabetes: No ? ?How often do you need to have someone help you when you read instructions, pamphlets, or other written materials from your doctor or pharmacy?: 1 - Never ? ?Diabetic? no ? ?Interpreter Needed?: No ? ?Information entered by :: Phi Avans, LPN ? ? ?Activities of Daily Living ? ?  02/12/2022  ?  8:37 AM  ?In your present state of health, do you have any difficulty performing the following activities:  ?Hearing? 1  ?Vision? 1  ?Difficulty concentrating or making decisions? 0  ?Walking or climbing stairs? 1  ?Dressing or bathing? 0  ?Doing errands, shopping? 1  ?Preparing Food and eating ? N  ?Using the Toilet? N  ?In the past six months, have you accidently leaked urine? N  ?Do you have problems with loss of bowel control? N  ?Managing your Medications? Y  ?Managing your Finances? N  ?Housekeeping or managing your Housekeeping? N  ? ? ?Patient Care Team: ?Tower, Wynelle Fanny, MD as PCP - General ?Jarome Matin, MD as Consulting Physician (Dermatology) ?Princess Bruins, MD as Referring  Physician (Ophthalmology) ? ?Indicate any recent Medical Services you may have received from other than [facility] providers in the past year (date may be approximate). ? ?   ?Assessment:  ? This is a routine wel

## 2022-02-12 NOTE — Patient Instructions (Signed)
Mr. Kollmann , ?Thank you for taking time to come for your Medicare Wellness Visit. I appreciate your ongoing commitment to your health goals. Please review the following plan we discussed and let me know if I can assist you in the future.  ? ?Screening recommendations/referrals: ?Colonoscopy: No longer required ?Recommended yearly ophthalmology/optometry visit for glaucoma screening and checkup ?Recommended yearly dental visit for hygiene and checkup ? ?Vaccinations: ?Influenza vaccine: recommended every fall ?Pneumococcal vaccine: Done 09/17/2011 & 12/11/2014 ?Tdap vaccine: Done 09/10/2009 - Repeat in 10 years *due ?Shingles vaccine: Due - Shingrix is 2 doses 2-6 months apart and over 90% effective     ?Covid-19: Done 12/06/2019 & 12/27/2019 - declines boosters ? ?Advanced directives: Please bring a copy of your health care power of attorney and living will to the office to be added to your chart at your convenience.  ? ?Conditions/risks identified: Aim for 30 minutes of exercise or brisk walking, 6-8 glasses of water, and 5 servings of fruits and vegetables each day.  ? ?Next appointment: Follow up in one year for your annual wellness visit.  ? ?Preventive Care 30 Years and Older, Male ? ?Preventive care refers to lifestyle choices and visits with your health care provider that can promote health and wellness. ?What does preventive care include? ?A yearly physical exam. This is also called an annual well check. ?Dental exams once or twice a year. ?Routine eye exams. Ask your health care provider how often you should have your eyes checked. ?Personal lifestyle choices, including: ?Daily care of your teeth and gums. ?Regular physical activity. ?Eating a healthy diet. ?Avoiding tobacco and drug use. ?Limiting alcohol use. ?Practicing safe sex. ?Taking low doses of aspirin every day. ?Taking vitamin and mineral supplements as recommended by your health care provider. ?What happens during an annual well check? ?The  services and screenings done by your health care provider during your annual well check will depend on your age, overall health, lifestyle risk factors, and family history of disease. ?Counseling  ?Your health care provider may ask you questions about your: ?Alcohol use. ?Tobacco use. ?Drug use. ?Emotional well-being. ?Home and relationship well-being. ?Sexual activity. ?Eating habits. ?History of falls. ?Memory and ability to understand (cognition). ?Work and work Statistician. ?Screening  ?You may have the following tests or measurements: ?Height, weight, and BMI. ?Blood pressure. ?Lipid and cholesterol levels. These may be checked every 5 years, or more frequently if you are over 35 years old. ?Skin check. ?Lung cancer screening. You may have this screening every year starting at age 50 if you have a 30-pack-year history of smoking and currently smoke or have quit within the past 15 years. ?Fecal occult blood test (FOBT) of the stool. You may have this test every year starting at age 62. ?Flexible sigmoidoscopy or colonoscopy. You may have a sigmoidoscopy every 5 years or a colonoscopy every 10 years starting at age 8. ?Prostate cancer screening. Recommendations will vary depending on your family history and other risks. ?Hepatitis C blood test. ?Hepatitis B blood test. ?Sexually transmitted disease (STD) testing. ?Diabetes screening. This is done by checking your blood sugar (glucose) after you have not eaten for a while (fasting). You may have this done every 1-3 years. ?Abdominal aortic aneurysm (AAA) screening. You may need this if you are a current or former smoker. ?Osteoporosis. You may be screened starting at age 32 if you are at high risk. ?Talk with your health care provider about your test results, treatment options, and if necessary, the need  for more tests. ?Vaccines  ?Your health care provider may recommend certain vaccines, such as: ?Influenza vaccine. This is recommended every year. ?Tetanus,  diphtheria, and acellular pertussis (Tdap, Td) vaccine. You may need a Td booster every 10 years. ?Zoster vaccine. You may need this after age 10. ?Pneumococcal 13-valent conjugate (PCV13) vaccine. One dose is recommended after age 75. ?Pneumococcal polysaccharide (PPSV23) vaccine. One dose is recommended after age 10. ?Talk to your health care provider about which screenings and vaccines you need and how often you need them. ?This information is not intended to replace advice given to you by your health care provider. Make sure you discuss any questions you have with your health care provider. ?Document Released: 10/26/2015 Document Revised: 06/18/2016 Document Reviewed: 07/31/2015 ?Elsevier Interactive Patient Education ? 2017 [employer]. ? ?Fall Prevention in the Home ?Falls can cause injuries. They can happen to people of all ages. There are many things you can do to make your home safe and to help prevent falls. ?What can I do on the outside of my home? ?Regularly fix the edges of walkways and driveways and fix any cracks. ?Remove anything that might make you trip as you walk through a door, such as a raised step or threshold. ?Trim any bushes or trees on the path to your home. ?Use bright outdoor lighting. ?Clear any walking paths of anything that might make someone trip, such as rocks or tools. ?Regularly check to see if handrails are loose or broken. Make sure that both sides of any steps have handrails. ?Any raised decks and porches should have guardrails on the edges. ?Have any leaves, snow, or ice cleared regularly. ?Use sand or salt on walking paths during winter. ?Clean up any spills in your garage right away. This includes oil or grease spills. ?What can I do in the bathroom? ?Use night lights. ?Install grab bars by the toilet and in the tub and shower. Do not use towel bars as grab bars. ?Use non-skid mats or decals in the tub or shower. ?If you need to sit down in the shower, use a plastic,  non-slip stool. ?Keep the floor dry. Clean up any water that spills on the floor as soon as it happens. ?Remove soap buildup in the tub or shower regularly. ?Attach bath mats securely with double-sided non-slip rug tape. ?Do not have throw rugs and other things on the floor that can make you trip. ?What can I do in the bedroom? ?Use night lights. ?Make sure that you have a light by your bed that is easy to reach. ?Do not use any sheets or blankets that are too big for your bed. They should not hang down onto the floor. ?Have a firm chair that has side arms. You can use this for support while you get dressed. ?Do not have throw rugs and other things on the floor that can make you trip. ?What can I do in the kitchen? ?Clean up any spills right away. ?Avoid walking on wet floors. ?Keep items that you use a lot in easy-to-reach places. ?If you need to reach something above you, use a strong step stool that has a grab bar. ?Keep electrical cords out of the way. ?Do not use floor polish or wax that makes floors slippery. If you must use wax, use non-skid floor wax. ?Do not have throw rugs and other things on the floor that can make you trip. ?What can I do with my stairs? ?Do not leave any items on the stairs. ?  Make sure that there are handrails on both sides of the stairs and use them. Fix handrails that are broken or loose. Make sure that handrails are as long as the stairways. ?Check any carpeting to make sure that it is firmly attached to the stairs. Fix any carpet that is loose or worn. ?Avoid having throw rugs at the top or bottom of the stairs. If you do have throw rugs, attach them to the floor with carpet tape. ?Make sure that you have a light switch at the top of the stairs and the bottom of the stairs. If you do not have them, ask someone to add them for you. ?What else can I do to help prevent falls? ?Wear shoes that: ?Do not have high heels. ?Have rubber bottoms. ?Are comfortable and fit you well. ?Are closed  at the toe. Do not wear sandals. ?If you use a stepladder: ?Make sure that it is fully opened. Do not climb a closed stepladder. ?Make sure that both sides of the stepladder are locked into place. ?Ask someone to hold

## 2022-02-12 NOTE — Telephone Encounter (Signed)
I read his amw today and it looks like he was not taking the metoprolol that cardiology gave him ?Please ask if he would like a referral to our pharmacist to review and clarify meds with him  ? ? ?

## 2022-02-13 ENCOUNTER — Telehealth: Payer: Self-pay

## 2022-02-13 NOTE — Telephone Encounter (Signed)
? ?  Telephone encounter was:  Successful.  ?02/13/2022 ?Name: Jamie Byrd MRN: [id number] DOB: 07-Dec-1933 ? ?Jamie Byrd is a 88 y.o. year old male who is a primary care patient of Tower, Wynelle Fanny, MD . The community resource team was consulted for assistance with Transportation Needs  ? ?Care guide performed the following interventions: Spoke with patient and his spouse about transportation for his upcoming appointments this month.  Both responded that the patient has transportation for his upcoming appointments but wanted a resouce as backup.  Mailed [employer]. ? ?Follow Up Plan:  Care guide will follow up with patient by phone over the next 7-10 days. ? ?Briyonna Omara, [place], CHC ?Care Guide  Embedded Care Coordination ?[facility]  ?[address] ?[place], [place] [address] ?[email or web address]  ?? [phone]   ?[email or web address] ?  ?

## 2022-02-13 NOTE — Telephone Encounter (Signed)
Aware, thanks!

## 2022-02-13 NOTE — Telephone Encounter (Signed)
Pt said he's "been taking what he normally takes for years" pt said he gets the pharmacist at [facility] to help him get his medications together because he is blind. Pt said he doesn't see a reason for pharmacist to call and go over his meds because he will not be able to read/see the bottles  ?

## 2022-02-18 ENCOUNTER — Encounter: Payer: Self-pay | Admitting: Dermatology

## 2022-02-18 ENCOUNTER — Ambulatory Visit (INDEPENDENT_AMBULATORY_CARE_PROVIDER_SITE_OTHER): Payer: Medicare Other | Admitting: Dermatology

## 2022-02-18 DIAGNOSIS — L910 Hypertrophic scar: Secondary | ICD-10-CM

## 2022-02-18 DIAGNOSIS — Z85828 Personal history of other malignant neoplasm of skin: Secondary | ICD-10-CM | POA: Diagnosis not present

## 2022-02-18 DIAGNOSIS — C44319 Basal cell carcinoma of skin of other parts of face: Secondary | ICD-10-CM

## 2022-02-18 DIAGNOSIS — L578 Other skin changes due to chronic exposure to nonionizing radiation: Secondary | ICD-10-CM

## 2022-02-18 DIAGNOSIS — L570 Actinic keratosis: Secondary | ICD-10-CM | POA: Diagnosis not present

## 2022-02-18 DIAGNOSIS — I25118 Atherosclerotic heart disease of native coronary artery with other forms of angina pectoris: Secondary | ICD-10-CM

## 2022-02-18 DIAGNOSIS — D2339 Other benign neoplasm of skin of other parts of face: Secondary | ICD-10-CM

## 2022-02-18 DIAGNOSIS — D239 Other benign neoplasm of skin, unspecified: Secondary | ICD-10-CM

## 2022-02-18 NOTE — Progress Notes (Signed)
? ?Follow-Up Visit ?  ?Subjective  ?Jamie Byrd is a 88 y.o. male who presents for the following: Follow-up. ? ?Patient here for 4 month follow-up. He has a history of multiple BCCs and SCCs. BCC of the left zygoma and right temple not treated yet, EDC today. He also has a new spot on the forehead to check. ? ?The following portions of the chart were reviewed this encounter and updated as appropriate:  ?  ?  ? ?Review of Systems:  No other skin or systemic complaints except as noted in HPI or Assessment and Plan. ? ?Objective  ?Well appearing patient in no apparent distress; mood and affect are within normal limits. ? ?A focused examination was performed including face. Relevant physical exam findings are noted in the Assessment and Plan. ? ?Right Medial Cheek ?Dilated pore ? ?Right Temple ?Crusted biopsy site ? ?L zygoma ?Biopsy scar, clear ? ?Left Temple ?Clear ? ?L mid zygoma x 1, R glabella x 1, mid forehead x 1 (3) ?Keratotic papules ? ?right nasolabial fold ?Scar with thickening ? ? ? ?Assessment & Plan  ?Actinic Damage ?- chronic, secondary to cumulative UV radiation exposure/sun exposure over time ?- diffuse scaly erythematous macules with underlying dyspigmentation ?- Recommend daily broad spectrum sunscreen SPF 30+ to sun-exposed areas, reapply every 2 hours as needed.  ?- Recommend staying in the shade or wearing long sleeves, sun glasses (UVA+UVB protection) and wide brim hats (4-inch brim around the entire circumference of the hat). ?- Call for new or changing lesions. ? ?Dilated pore of [place] ?Right Medial Cheek ? ?Recheck on follow-up. Observation.  ? ?Basal cell carcinoma of skin of other parts of face ?Right Temple ? ?Destruction of lesion ? ?Destruction method: electrodesiccation and curettage   ?Informed consent: discussed and consent obtained   ?Timeout:  patient name, date of birth, surgical site, and procedure verified ?Anesthesia: the lesion was anesthetized in a standard fashion    ?Anesthetic:  1% lidocaine w/ epinephrine 1-100,000 local infiltration ?Curettage performed in three different directions: Yes   ?Electrodesiccation performed over the curetted area: Yes   ?Final wound size (cm):  0.6 ?Hemostasis achieved with:  pressure, aluminum chloride and electrodesiccation ?Outcome: patient tolerated procedure well with no complications   ?Post-procedure details: wound care instructions given   ?Post-procedure details comment:  Ointment and bandage applied. ? ?Biopsy proven 09/12/2022 ?EDC today ? ?History of basal cell carcinoma (BCC) (2) ?Left Temple; L zygoma ? ?Left zygoma biopsy proven 09/12/2022. Scar appears clear with biopsy.  Observe for recurrence.  ? ?Left temple clear s/p Mohs 12/18/2021 ? ?Call clinic for new or changing lesions.  Recommend regular skin exams, daily broad-spectrum spf 30+ sunscreen use, and photoprotection.   ? ?Hypertrophic actinic keratosis (3) ?L mid zygoma x 1, R glabella x 1, mid forehead x 1 ? ?Actinic keratoses are precancerous spots that appear secondary to cumulative UV radiation exposure/sun exposure over time. They are chronic with expected duration over 1 year. A portion of actinic keratoses will progress to squamous cell carcinoma of the skin. It is not possible to reliably predict which spots will progress to skin cancer and so treatment is recommended to prevent development of skin cancer. ? ?Recommend daily broad spectrum sunscreen SPF 30+ to sun-exposed areas, reapply every 2 hours as needed.  ?Recommend staying in the shade or wearing long sleeves, sun glasses (UVA+UVB protection) and wide brim hats (4-inch brim around the entire circumference of the hat). ?Call for new or changing lesions. ? ?  Destruction of lesion - L mid zygoma x 1, R glabella x 1, mid forehead x 1 ? ?Destruction method: cryotherapy   ?Informed consent: discussed and consent obtained   ?Lesion destroyed using liquid nitrogen: Yes   ?Region frozen until ice ball extended beyond  lesion: Yes   ?Outcome: patient tolerated procedure well with no complications   ?Post-procedure details: wound care instructions given   ?Additional details:  Prior to procedure, discussed risks of blister formation, small wound, skin dyspigmentation, or rare scar following cryotherapy. Recommend Vaseline ointment to treated areas while healing. ? ? ?Hypertrophic scar ?right nasolabial fold ? ?Site of Mohs surgery done 01/01/2022, hx SCC. No evidence of recurrence.  ? ?Discussed otc Serica gel, pt defers today. Patient will massage area daily at home. Observation.  ? ?History of Squamous Cell Carcinoma of the Skin ?- No evidence of recurrence today ?- Recommend regular full body skin exams ?- Recommend daily broad spectrum sunscreen SPF 30+ to sun-exposed areas, reapply every 2 hours as needed.  ?- Call if any new or changing lesions are noted between office visits ? ? ?History of Basal Cell Carcinoma of the Skin ?- No evidence of recurrence today ?- Recommend regular full body skin exams ?- Recommend daily broad spectrum sunscreen SPF 30+ to sun-exposed areas, reapply every 2 hours as needed.  ?- Call if any new or changing lesions are noted between office visits ? ?Return in about 2 months (around 04/20/2022) for recheck L zygoma BCC site, EDC site right temple, AKs, dilated pore R med cheek. ? ?I, Jamesetta Orleans, CMA, am acting as scribe for Brendolyn Patty, MD . ?Documentation: I have reviewed the above documentation for accuracy and completeness, and I agree with the above. ? ?Brendolyn Patty MD  ? ?

## 2022-02-18 NOTE — Patient Instructions (Addendum)
Recommend Niacinamide or Nicotinamide '500mg'$  twice per day to lower risk of non-melanoma skin cancer by approximately 25%. This is usually available at [employer]. ? ? ?Wound Care Instructions ? ?Cleanse wound gently with soap and water once a day then pat dry with clean gauze. Apply a thing coat of Petrolatum (petroleum jelly, "Vaseline") over the wound (unless you have an allergy to this). We recommend that you use a new, sterile tube of Vaseline. Do not pick or remove scabs. Do not remove the yellow or white "healing tissue" from the base of the wound. ? ?Cover the wound with fresh, clean, nonstick gauze and secure with paper tape. You may use Band-Aids in place of gauze and tape if the would is small enough, but would recommend trimming much of the tape off as there is often too much. Sometimes Band-Aids can irritate the skin. ? ?If you experience any problems, such as abnormal amounts of bleeding, swelling, significant bruising, significant pain, or evidence of infection, please call the office immediately. ? ? ? ?If You Need Anything After Your Visit ? ?If you have any questions or concerns for your doctor, please call our main line at [phone] and press option 4 to reach your doctor's medical assistant. If no one answers, please leave a voicemail as directed and we will return your call as soon as possible. Messages left after 4 pm will be answered the following business day.  ? ?You may also send [place] a message via MyChart. We typically respond to MyChart messages within 1-2 business days. ? ?For prescription refills, please ask your pharmacy to contact our office. Our fax number is [phone]. ? ?If you have an urgent issue when the clinic is closed that cannot wait until the next business day, you can page your doctor at the number below.   ? ?Please note that while we do our best to be available for urgent issues outside of office hours, we are not available 24/7.  ? ?If you have an urgent issue  and are unable to reach [place], you may choose to seek medical care at your doctor's office, retail clinic, urgent care center, or emergency room. ? ?If you have a medical emergency, please immediately call 911 or go to the emergency department. ? ?Pager Numbers ? ?- Dr. Nehemiah Massed: [phone] ? ?- Dr. Laurence Ferrari: [phone] ? ?- Dr. Nicole Kindred: [phone] ? ?In the event of inclement weather, please call our main line at [phone] for an update on the status of any delays or closures. ? ?Dermatology Medication Tips: ?Please keep the boxes that topical medications come in in order to help keep track of the instructions about where and how to use these. Pharmacies typically print the medication instructions only on the boxes and not directly on the medication tubes.  ? ?If your medication is too expensive, please contact our office at [phone] option 4 or send [place] a message through [place].  ? ?We are unable to tell what your co-pay for medications will be in advance as this is different depending on your insurance coverage. However, we may be able to find a substitute medication at lower cost or fill out paperwork to get insurance to cover a needed medication.  ? ?If a prior authorization is required to get your medication covered by your insurance company, please allow [place] 1-2 business days to complete this process. ? ?Drug prices often vary depending on where the prescription is filled and some pharmacies may offer cheaper prices. ? ?The website  www.goodrx.com contains coupons for medications through different pharmacies. The prices here do not account for what the cost may be with help from insurance (it may be cheaper with your insurance), but the website can give you the price if you did not use any insurance.  ?- You can print the associated coupon and take it with your prescription to the pharmacy.  ?- You may also stop by our office during regular business hours and pick up a GoodRx coupon card.  ?- If you need  your prescription sent electronically to a different pharmacy, notify our office through [facility] or by phone at [phone] option 4. ? ? ? ? ?Si Usted Necesita Algo Despu?s de Su Visita ? ?Tambi?n puede enviarnos un mensaje a trav?s de MyChart. Por lo general respondemos a los mensajes de MyChart en el transcurso de 1 a 2 d?as h?biles. ? ?Para renovar recetas, por favor pida a su farmacia que se ponga en contacto con nuestra oficina. Nuestro n?mero de fax es el [phone]. ? ?Si tiene un asunto urgente cuando la cl?nica est? cerrada y que no puede esperar hasta el siguiente d?a h?bil, puede llamar/localizar a su doctor(a) al n?mero que aparece a continuaci?n.  ? ?Por favor, tenga en cuenta que aunque hacemos todo lo posible para estar disponibles para asuntos urgentes fuera del horario de oficina, no estamos disponibles las 24 horas del d?a, los 7 d?as de la semana.  ? ?Si tiene un problema urgente y no puede comunicarse con nosotros, puede optar por buscar atenci?n m?dica  en el consultorio de su doctor(a), en una cl?nica privada, en un centro de atenci?n urgente o en una sala de emergencias. ? ?Si tiene Engineer, maintenance (IT) m?dica, por favor llame inmediatamente al 911 o vaya a la sala de emergencias. ? ?N?meros de b?per ? ?- Dr. Nehemiah Massed: [phone] ? ?- Dra. Moye: [phone] ? ?- Dra. Nicole Kindred: [phone] ? ?En caso de inclemencias del tiempo, por favor llame a nuestra l?nea principal al [phone] para una actualizaci?n sobre el estado de cualquier retraso o cierre. ? ?Consejos para la medicaci?n en dermatolog?a: ?Por favor, guarde las cajas en las que vienen los medicamentos de uso t?pico para ayudarle a seguir las instrucciones sobre d?nde y c?mo usarlos. Las farmacias generalmente imprimen las instrucciones del medicamento s?lo en las cajas y no directamente en los tubos del [place].  ? ?Si su medicamento es muy caro, por favor, p?ngase en contacto con Zigmund Daniel llamando al  [phone] y presione la opci?n 4 o env?enos un mensaje a trav?s de MyChart.  ? ?No podemos decirle cu?l ser? su copago por los medicamentos por adelantado ya que esto es diferente dependiendo de la cobertura de su seguro. Sin embargo, es posible que podamos encontrar un medicamento sustituto a Electrical engineer un formulario para que el seguro cubra el medicamento que se considera necesario.  ? ?Si se requiere Ardelia Mems autorizaci?n previa para que su compa??a de seguros [place] su medicamento, por favor perm?tanos de 1 a 2 d?as h?biles para completar este proceso. ? ?Los precios de los medicamentos var?an con frecuencia dependiendo del Environmental consultant de d?nde se surte la receta y alguna farmacias pueden ofrecer precios m?s baratos. ? ?El sitio web www.goodrx.com tiene cupones para medicamentos de Airline pilot. Los precios aqu? no tienen en cuenta lo que podr?a costar con la ayuda del seguro (puede ser m?s barato con su seguro), pero el sitio web puede darle el precio si no utiliz? ning?n seguro.  ?- Puede imprimir  el cup?n correspondiente y llevarlo con su receta a la farmacia.  ?- Tambi?n puede pasar por nuestra oficina durante el horario de atenci?n regular y recoger una tarjeta de cupones de GoodRx.  ?- Si necesita que su receta se env?e electr?nicamente a Chiropodist, informe a nuestra oficina a trav?s de MyChart de [place] o por tel?fono llamando al [phone] y presione la opci?n 4. ? ?

## 2022-02-24 ENCOUNTER — Telehealth: Payer: Self-pay

## 2022-02-24 NOTE — Telephone Encounter (Signed)
? ?  Telephone encounter was:  Successful.  ?02/24/2022 ?Name: [name] MRN: [id number] DOB: 1934/05/12 ? ?Jamie Byrd is a 88 y.o. year old male who is a primary care patient of Tower, Wynelle Fanny, MD . The community resource team was consulted for assistance with Transportation Needs  ? ?Care guide performed the following interventions: Spoke with patient, he received the application for [facility].  His sister completed the application and returned it in the mail. No further assistance is needed at this time.  ? ?Follow Up Plan:  No further follow up planned at this time. The patient has been provided with needed resources. ? ?Chantal Worthey, [place], CHC ?Care Guide  Embedded Care Coordination ?[facility]  ?[address] ?[place], [place] [address] ?[email or web address]  ?? [phone]   ?[email or web address] ?  ?

## 2022-03-04 NOTE — Progress Notes (Unsigned)
Primary Physician/Referring:  Tower, Wynelle Fanny, MD  Patient ID: Jamie Byrd, male    DOB: 10-21-1933, 88 y.o.   MRN: [id number]  No chief complaint on file.  HPI:    Jamie Byrd  is a 88 y.o. Caucasian male with history of hypertension, hypothyroidism, prediabetes, former smoker.  Patient has history of CAD status post complex PCI 09/26/2021 to LAD.   Patient was last seen in our office 11/06/2021 at which time resumed Plavix which patient had inadvertently discontinued and ordered repeat echocardiogram.  Repeat echo revealed LVEF of 60-65% with no regional wall motion abnormalities and mildly dilated aortic root.  Patient now presents for 18-monthfollow-up. ***  ***Needs DAPT until 09/2022. Review echo.   Patient presented to the emergency department 09/25/2021 with chest pain ongoing for the previous 7 days.  Given unstable angina patient underwent coronary angiogram which noted 99% ostial stenosis in the LAD and 80% stenosis of ramus.  Given findings of the angiogram recommended consultation from CVTS for possible CABG, however patient was adamantly against CABG and instead opted for high risk PCI.  Patient underwent successful atherectomy and OCT guided complex PCI (09/26/2021) after patient refused to consider CABG. patient did have right forearm hematoma after cardiac catheterization, which was resolving at discharge.  Patient was discharged with aspirin and Plavix, lisinopril, rosuvastatin, and as needed sublingual nitroglycerin.    Patient presents for 4-week follow-up.  At last office visit patient had not been taking rosuvastatin, therefore resumed this as well as started low-dose beta-blocker therapy with Toprol-XL 12.5 mg daily.  Patient is tolerating addition of Toprol-XL without issue.  He remains asymptomatic, denies chest pain, dyspnea, syncope, near syncope, orthopnea, PND, leg edema.   He is doing chores around the house and taking care of his wife without issue.   Patient's primary concern today is the cost of his medications.  Patient has been unable to afford Plavix, and is therefore been without it for the last 1 to 2 weeks.  Our office clinical pharmacist, TManuela Schwartz reviewed patient medications and identified [place] be less [place].  Past Medical History:  Diagnosis Date   BCC (basal cell carcinoma) 09/12/2021   right neck posterior EDC 11/21/2021   BCC (basal cell carcinoma) 09/12/2021   right temple, EDC 02/18/2022   BCC (basal cell carcinoma) 09/12/2021   left temple, Mohs 12/18/21   BCC (basal cell carcinoma) 09/12/2021   left zygoma, Clear with biopsy 02/18/2022   BCC (basal cell carcinoma) 10/24/2021   right nasolabial fold, MOHs 01/01/2022   BPH (benign prostatic hypertrophy)    Difficulty reading    Difficulty seeing    Difficulty in seeing certain things and reading/ not legally blind!   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   right neck anterior, well differentiated, base involved EDC 11/21/2021   Squamous cell carcinoma of skin 11/21/2021   right dorsal hand, EDC   Past Surgical History:  Procedure Laterality Date   COLONOSCOPY     CORONARY ATHERECTOMY N/A 09/26/2021   Procedure: CORONARY ATHERECTOMY;  Surgeon: PNigel Mormon MD;  [facility];  Service: Cardiovascular;  Laterality: N/A;   CORONARY STENT INTERVENTION N/A 09/26/2021   Procedure: CORONARY STENT INTERVENTION;  Surgeon: PNigel Mormon MD;  [facility];  Service: Cardiovascular;  Laterality: N/A;   INTRAVASCULAR  IMAGING/OCT N/A 09/26/2021   Procedure: INTRAVASCULAR IMAGING/OCT;  Surgeon: Nigel Mormon, MD;  Location: [facility];  Service: Cardiovascular;  Laterality: N/A;   LEFT HEART CATH AND CORONARY ANGIOGRAPHY N/A 09/25/2021   Procedure: LEFT HEART CATH AND CORONARY ANGIOGRAPHY;  Surgeon:  Nigel Mormon, MD;  Location: [facility];  Service: Cardiovascular;  Laterality: N/A;   POLYPECTOMY     Family History  Problem Relation Age of Onset   Heart disease Mother        CABG   Heart disease Father        CAD   Cancer Father        lung CA   Cancer Brother        lung CA    Social History   Tobacco Use   Smoking status: Former    Types: Cigarettes    Quit date: 10/13/1968    Years since quitting: 53.4   Smokeless tobacco: Never  Substance Use Topics   Alcohol use: No    Comment: no alcohol in 20 years   Marital Status: Married   ROS  Review of Systems  Constitutional: Negative for malaise/fatigue and weight gain.  Cardiovascular:  Negative for chest pain, claudication, leg swelling, near-syncope, orthopnea, palpitations, paroxysmal nocturnal dyspnea and syncope.  Respiratory:  Negative for shortness of breath.   Neurological:  Negative for dizziness.   Objective  There were no vitals taken for this visit.     02/12/2022    8:18 AM 11/06/2021    8:52 AM 11/06/2021    8:48 AM  Vitals with BMI  Height   '6\' 0"'$   Weight 212 lbs  212 lbs  BMI   58.52  Systolic  778 242  Diastolic  78 76  Pulse  65 68      Physical Exam Vitals reviewed.  HENT:     Head: Normocephalic and atraumatic.  Neck:     Vascular: No carotid bruit.  Cardiovascular:     Rate and Rhythm: Normal rate and regular rhythm.     Pulses: Intact distal pulses.     Heart sounds: S1 normal and S2 normal. No murmur heard.   No gallop.  Pulmonary:     Effort: Pulmonary effort is normal. No respiratory distress.     Breath sounds: No wheezing, rhonchi or rales.  Musculoskeletal:     Right lower leg: No edema.     Left lower leg: No edema.  Neurological:     Mental Status: He is alert.    Laboratory examination:   Recent Labs    09/25/21 0334 09/26/21 0115 09/27/21 0326  NA 133* 138 134*  K 3.9 4.6 4.0  CL 101 105 104  CO2 '25 27 24  '$ GLUCOSE 94 101* 103*  BUN '11 13  16  '$ CREATININE 0.98 1.08 1.04  CALCIUM 9.3 9.0 8.9  GFRNONAA >60 >60 >60    CrCl cannot be calculated (Patient's most recent lab result is older than the maximum 21 days allowed.).     Latest Ref Rng & Units 09/27/2021    3:26 AM 09/26/2021    1:15 AM 09/25/2021    3:34 AM  CMP  Glucose 70 - 99 mg/dL 103   101   94    BUN 8 - 23 mg/dL '16   13   11    '$ Creatinine 0.61 - 1.24 mg/dL 1.04   1.08   0.98    Sodium 135 - 145 mmol/L 134  138   133    Potassium 3.5 - 5.1 mmol/L 4.0   4.6   3.9    Chloride 98 - 111 mmol/L 104   105   101    CO2 22 - 32 mmol/L '24   27   25    '$ Calcium 8.9 - 10.3 mg/dL 8.9   9.0   9.3    Total Protein 6.5 - 8.1 g/dL   6.0    Total Bilirubin 0.3 - 1.2 mg/dL   1.2    Alkaline Phos 38 - 126 U/L   98    AST 15 - 41 U/L   25    ALT 0 - 44 U/L   15        Latest Ref Rng & Units 09/27/2021    3:26 AM 09/26/2021    1:15 AM 09/25/2021    3:34 AM  CBC  WBC 4.0 - 10.5 K/uL 6.3   6.1   5.3    Hemoglobin 13.0 - 17.0 g/dL 11.9   13.0   14.0    Hematocrit 39.0 - 52.0 % 35.4   37.7   42.0    Platelets 150 - 400 K/uL 133   144   146      Lipid Panel Recent Labs    05/23/21 1001 09/25/21 0334  CHOL 116 117  TRIG 139.0 58  LDLCALC 58 72  VLDL 27.8 12  HDL 29.70* 33*  CHOLHDL 4 3.5     HEMOGLOBIN A1C Lab Results  Component Value Date   HGBA1C 5.3 05/23/2021   TSH Recent Labs    05/23/21 1001  TSH 5.52*    External labs:  None  Allergies  No Known Allergies   Medications Prior to Visit:   Outpatient Medications Prior to Visit  Medication Sig Dispense Refill   allopurinol (ZYLOPRIM) 100 MG tablet Take 1 tablet (100 mg total) by mouth daily. 90 tablet 3   aspirin 81 MG EC tablet Take 1 tablet (81 mg total) by mouth daily. Swallow whole. 30 tablet 2   clopidogrel (PLAVIX) 75 MG tablet TAKE 1 TABLET BY MOUTH ONCE A DAY 90 tablet 3   levothyroxine (SYNTHROID) 88 MCG tablet Take 1 tablet (88 mcg total) by mouth daily. 90 tablet 3   lisinopril  (ZESTRIL) 20 MG tablet Take 1 tablet (20 mg total) by mouth daily. 90 tablet 3   metoprolol succinate (TOPROL-XL) 25 MG 24 hr tablet Take 0.5 tablets (12.5 mg total) by mouth daily. Take with or immediately following a meal. 45 tablet 3   nitroGLYCERIN (NITROSTAT) 0.4 MG SL tablet Place 1 tablet (0.4 mg total) under the tongue every 5 (five) minutes as needed for chest pain. 30 tablet 2   rosuvastatin (CRESTOR) 20 MG tablet Take 1 tablet (20 mg total) by mouth daily. 90 tablet 3   No facility-administered medications prior to visit.   Final Medications at End of Visit    No outpatient medications have been marked as taking for the 03/06/22 encounter (Appointment) with Rayetta Pigg, Alessia Gonsalez C, PA-C.   Radiology:   No results found.  Cardiac Studies:  PCV ECHOCARDIOGRAM COMPLETE 67/09/4579 Normal LV systolic function with visual EF 60-65%. Left ventricle cavity is normal in size. Normal left ventricular wall thickness. Normal global wall motion. Normal diastolic filling pattern, normal LAP. Trace aortic regurgitation. The aortic root is mildly dilated (Sinus of Valsalva 3.96cm and  Sinotubular junction 3.8cm). Proximal ascending aorta upper limit of normal 3.7cm. Compared to study 09/25/2021  Aortic root was 42m and now aortic root measures (Sinus of Valsalva 3.96cm and  Sinotubular junction 3.8cm).   Coronary intervention 09/26/2021: Coronary intervention: 80% mid high diag stenosis.  99% ostial LAD stenosis Successful percutaneous coronary intervention mid high diag and ostial LAD        PTCA and overlapping stents placement high diag        3.0 X 22 mm Onyx Frontier drug-eluting stent        3.0 X 15 mm Onyx Frontier drug-eluting stent         Post dilatation with 3.25 mm [place] balloon        80%--->0% residual stenosis. TIMI flow II-->III        PTCA and overlapping stents placement ostial LAD        3.0 X 15 mm Onyx Frontier drug-eluting stent        Post dilatation with 3.5 mm [place]  balloon        99%--->0% residual stenosis. TIMI flow II-->III   Coronary angiogram 09/25/2021: LM: Normal LAD: Ostial 99% stenosis with severe calcification Ramus: Mid 80% eccentric stenosis with severe calcification Lcx: Small OM1 with 50% disease        Large OM1 with mid 30% disease RCA: Prox 50% stenosis with severe calcification   LVEDP normal LVEF normal on echocardiogram   Given location of the ostial LAD lesion with severe calcification with a large ramus arising at an acute angle with respect to LAD.  Will consult CVTS for possible CABG to LAD and ramus Limiting factors could be age and aorta calcification. Also, patient is the primary caretaker to his 878y/o wife with dementia. He does not have living children, but has brothers and sisters in their 658s I spoke with his sister BGeralyn Corwin([phone]   If not a surgical candidate, could consider high risk PCI ostial LAD, along with PCI to ramus  EKG:  ***   10/01/2021: Sinus rhythm rate of 64 bpm.  Normal axis.  Anteroseptal infarct old.  EKG 09/27/2021: Sinus rhythm Occasional PAC Septal infarct, age indeterminate  Assessment   No diagnosis found.    There are no discontinued medications.   No orders of the defined types were placed in this encounter.   Recommendations:   EGABRIELA GIANNELLIis a 88y.o. Caucasian male with history of hypertension, hypothyroidism, prediabetes, former smoker.  Presented with unstable angina 09/25/2021 with high grade 99% ostial LAD lesion and another 80% lesion in ramus/high diag. He underwent successful atherectomy and OCT guided complex PCI (09/26/2021) after patient refused to consider CABG. *** CAD: Continue dual antiplatelet therapy with aspirin and Plavix until at least 09/2022 Patient has been without Plavix for the last 1 to 2 weeks.  I have refilled this at [facility], which is more formal for the patient.  Patient will take loading dose of Plavix today, then  continue with 75 mg once daily. He has resumed rosuvastatin and is tolerating this without issue, will continue. Continue Toprol-XL 12.5 mg daily, continue lisinopril 10 mg daily Echo noted hypokinetic anterior septum, will repeat echocardiogram in March   Hypertension: Blood pressure was initially elevated in the office, but it is well controlled upon recheck.  Continue lisinopril, metoprolol  Mild aortic root dilation: There is mild dilatation of the aortic root, measuring 43 mm on echocardiogram 09/25/2021. Will repeat echocardiogram in March given hypokinetic anterior septum, will continue to monitor aortic root. Discussed with patient transitioning from lisinopril to losartan given  aortic root dilation, however patient is having difficulty affording his medications and lisinopril is less expensive.  Therefore shared decision was to continue lisinopril at this time.  Follow up in 4 months, sooner if needed.    Alethia Berthold, PA-C 03/04/2022, 10:10 AM Office: [phone]

## 2022-03-06 ENCOUNTER — Encounter: Payer: Self-pay | Admitting: Student

## 2022-03-06 ENCOUNTER — Ambulatory Visit: Payer: Medicare Other | Admitting: Student

## 2022-03-06 VITALS — BP 128/74 | HR 66 | Temp 98.2°F | Resp 17 | Ht 72.0 in | Wt 211.8 lb

## 2022-03-06 DIAGNOSIS — I77810 Thoracic aortic ectasia: Secondary | ICD-10-CM | POA: Diagnosis not present

## 2022-03-06 DIAGNOSIS — I25118 Atherosclerotic heart disease of native coronary artery with other forms of angina pectoris: Secondary | ICD-10-CM | POA: Diagnosis not present

## 2022-03-06 DIAGNOSIS — I10 Essential (primary) hypertension: Secondary | ICD-10-CM

## 2022-04-21 ENCOUNTER — Ambulatory Visit (INDEPENDENT_AMBULATORY_CARE_PROVIDER_SITE_OTHER): Payer: Medicare Other | Admitting: Dermatology

## 2022-04-21 DIAGNOSIS — D485 Neoplasm of uncertain behavior of skin: Secondary | ICD-10-CM

## 2022-04-21 DIAGNOSIS — L578 Other skin changes due to chronic exposure to nonionizing radiation: Secondary | ICD-10-CM

## 2022-04-21 DIAGNOSIS — Z85828 Personal history of other malignant neoplasm of skin: Secondary | ICD-10-CM | POA: Diagnosis not present

## 2022-04-21 DIAGNOSIS — I25118 Atherosclerotic heart disease of native coronary artery with other forms of angina pectoris: Secondary | ICD-10-CM | POA: Diagnosis not present

## 2022-04-21 NOTE — Patient Instructions (Signed)
Due to recent changes in healthcare laws, you may see results of your pathology and/or laboratory studies on MyChart before the doctors have had a chance to review them. We understand that in some cases there may be results that are confusing or concerning to you. Please understand that not all results are received at the same time and often the doctors may need to interpret multiple results in order to provide you with the best plan of care or course of treatment. Therefore, we ask that you please give us 2 business days to thoroughly review all your results before contacting the office for clarification. Should we see a critical lab result, you will be contacted sooner.   If You Need Anything After Your Visit  If you have any questions or concerns for your doctor, please call our main line at [phone] and press option 4 to reach your doctor's medical assistant. If no one answers, please leave a voicemail as directed and we will return your call as soon as possible. Messages left after 4 pm will be answered the following business day.   You may also send us a message via MyChart. We typically respond to MyChart messages within 1-2 business days.  For prescription refills, please ask your pharmacy to contact our office. Our fax number is [phone].  If you have an urgent issue when the clinic is closed that cannot wait until the next business day, you can page your doctor at the number below.    Please note that while we do our best to be available for urgent issues outside of office hours, we are not available 24/7.   If you have an urgent issue and are unable to reach us, you may choose to seek medical care at your doctor's office, retail clinic, urgent care center, or emergency room.  If you have a medical emergency, please immediately call 911 or go to the emergency department.  Pager Numbers  - Dr. Kowalski: [phone]  - Dr. Moye: [phone]  - Dr. Stewart:  [phone]  In the event of inclement weather, please call our main line at [phone] for an update on the status of any delays or closures.  Dermatology Medication Tips: Please keep the boxes that topical medications come in in order to help keep track of the instructions about where and how to use these. Pharmacies typically print the medication instructions only on the boxes and not directly on the medication tubes.   If your medication is too expensive, please contact our office at [phone] option 4 or send us a message through MyChart.   We are unable to tell what your co-pay for medications will be in advance as this is different depending on your insurance coverage. However, we may be able to find a substitute medication at lower cost or fill out paperwork to get insurance to cover a needed medication.   If a prior authorization is required to get your medication covered by your insurance company, please allow us 1-2 business days to complete this process.  Drug prices often vary depending on where the prescription is filled and some pharmacies may offer cheaper prices.  The website www.goodrx.com contains coupons for medications through different pharmacies. The prices here do not account for what the cost may be with help from insurance (it may be cheaper with your insurance), but the website can give you the price if you did not use any insurance.  - You can print the associated coupon and take it with   your prescription to the pharmacy.  - You may also stop by our office during regular business hours and pick up a GoodRx coupon card.  - If you need your prescription sent electronically to a different pharmacy, notify our office through [facility] MyChart or by phone at [phone] option 4.     Si Usted Necesita Algo Despus de Su Visita  Tambin puede enviarnos un mensaje a travs de MyChart. Por lo general respondemos a los mensajes de MyChart en el transcurso de 1 a 2  das hbiles.  Para renovar recetas, por favor pida a su farmacia que se ponga en contacto con nuestra oficina. Nuestro nmero de fax es el [phone].  Si tiene un asunto urgente cuando la clnica est cerrada y que no puede esperar hasta el siguiente da hbil, puede llamar/localizar a su doctor(a) al nmero que aparece a continuacin.   Por favor, tenga en cuenta que aunque hacemos todo lo posible para estar disponibles para asuntos urgentes fuera del horario de oficina, no estamos disponibles las 24 horas del da, los 7 das de la semana.   Si tiene un problema urgente y no puede comunicarse con nosotros, puede optar por buscar atencin mdica  en el consultorio de su doctor(a), en una clnica privada, en un centro de atencin urgente o en una sala de emergencias.  Si tiene una emergencia mdica, por favor llame inmediatamente al 911 o vaya a la sala de emergencias.  Nmeros de bper  - Dr. Kowalski: [phone]  - Dra. Moye: [phone]  - Dra. Stewart: [phone]  En caso de inclemencias del tiempo, por favor llame a nuestra lnea principal al [phone] para una actualizacin sobre el estado de cualquier retraso o cierre.  Consejos para la medicacin en dermatologa: Por favor, guarde las cajas en las que vienen los medicamentos de uso tpico para ayudarle a seguir las instrucciones sobre dnde y cmo usarlos. Las farmacias generalmente imprimen las instrucciones del medicamento slo en las cajas y no directamente en los tubos del medicamento.   Si su medicamento es muy caro, por favor, pngase en contacto con nuestra oficina llamando al [phone] y presione la opcin 4 o envenos un mensaje a travs de MyChart.   No podemos decirle cul ser su copago por los medicamentos por adelantado ya que esto es diferente dependiendo de la cobertura de su seguro. Sin embargo, es posible que podamos encontrar un medicamento sustituto a menor costo o llenar un formulario para que el  seguro cubra el medicamento que se considera necesario.   Si se requiere una autorizacin previa para que su compaa de seguros cubra su medicamento, por favor permtanos de 1 a 2 das hbiles para completar este proceso.  Los precios de los medicamentos varan con frecuencia dependiendo del lugar de dnde se surte la receta y alguna farmacias pueden ofrecer precios ms baratos.  El sitio web www.goodrx.com tiene cupones para medicamentos de diferentes farmacias. Los precios aqu no tienen en cuenta lo que podra costar con la ayuda del seguro (puede ser ms barato con su seguro), pero el sitio web puede darle el precio si no utiliz ningn seguro.  - Puede imprimir el cupn correspondiente y llevarlo con su receta a la farmacia.  - Tambin puede pasar por nuestra oficina durante el horario de atencin regular y recoger una tarjeta de cupones de GoodRx.  - Si necesita que su receta se enve electrnicamente a una farmacia diferente, informe a nuestra oficina a travs de MyChart de [facility]   o por telfono llamando al [phone] y presione la opcin 4.  

## 2022-04-21 NOTE — Progress Notes (Signed)
   Follow-Up Visit   Subjective  Jamie Byrd is a 88 y.o. male who presents for the following: Follow-up.  Patient here for 2 month follow-up to recheck [facility] site of the left zygoma, biopsy only 09/12/2022. History of BCC of the right temple treated with [facility] 02/18/2022, left temple treated with Mohs 12/18/2021. 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  The following portions of the chart were reviewed this encounter and updated as appropriate:       Review of Systems:  No other skin or systemic complaints except as noted in HPI or Assessment and Plan.  Objective  Well appearing patient in no apparent distress; mood and affect are within normal limits.  A focused examination was performed including face. Relevant physical exam findings are noted in the Assessment and Plan.  Right Temple, Left Zygoma, Left temple Well healed scar with no evidence of recurrence.   Right Nasolabial Fold Well healed scar with no evidence of recurrence.   Right Medial Cheek 6.0 mm firm pearly papule with dilated pore       Assessment & Plan  Actinic Damage - chronic, secondary to cumulative UV radiation exposure/sun exposure over time - diffuse scaly erythematous macules with underlying dyspigmentation - Recommend daily broad spectrum sunscreen SPF 30+ to sun-exposed areas, reapply every 2 hours as needed.  - Recommend staying in the shade or wearing long sleeves, sun glasses (UVA+UVB protection) and wide brim hats (4-inch brim around the entire circumference of the hat). - Call for new or changing lesions.  History of basal cell carcinoma (BCC) Right Temple, Left Zygoma, Left temple  Left zygoma biopsy proven 09/12/2022. Scar appears clear with biopsy.  Observe for recurrence.    Left temple clear s/p Mohs 12/18/2021  Right temple clear s/p [facility] 02/18/2022   Clear. Observe for recurrence. Call clinic  for new or changing lesions.  Recommend regular skin exams, daily broad-spectrum spf 30+ sunscreen use, and photoprotection.    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 No evidence of recurrence.    Clear. Observe for recurrence. Call clinic for new or changing lesions.  Recommend regular skin exams, daily broad-spectrum spf 30+ sunscreen use, and photoprotection.    Neoplasm of uncertain behavior of skin Right Medial Cheek  Epidermal / dermal shaving  Lesion diameter (cm):  0.6 Informed consent: discussed and consent obtained   Patient was prepped and draped in usual sterile fashion: Area prepped with alcohol. Anesthesia: the lesion was anesthetized in a standard fashion   Anesthetic:  1% lidocaine w/ epinephrine 1-100,000 buffered w/ 8.4% NaHCO3 Instrument used: flexible razor blade   Hemostasis achieved with: pressure, aluminum chloride and electrodesiccation   Outcome: patient tolerated procedure well   Post-procedure details: wound care instructions given   Post-procedure details comment:  Ointment and small bandage applied  Specimen 1 - Surgical pathology Differential Diagnosis: Cyst r/o BCC Check Margins: No 6.0 mm firm pearly papule with dilated pore   Cyst vrs BCC, If positive, plan excision here.   Return pending biopsy results.  Jamie Byrd, Jamie Byrd, am acting as scribe for Brendolyn Patty, MD .  Documentation: I have reviewed the above documentation for accuracy and completeness, and I agree with the above.  Brendolyn Patty MD

## 2022-04-29 ENCOUNTER — Telehealth: Payer: Self-pay

## 2022-04-29 NOTE — Telephone Encounter (Signed)
-----   Message from Alfonso Patten, MD sent at 04/29/2022 10:34 AM EDT ----- Skin , right medial cheek ATYPICAL SQUAMOUS PROLIFERATION WITH CYSTIC CHANGE, BASE INVOLVED  This is probably the top of a squamous cell type skin cancer, which is very treatable. Dr. Nicole Kindred may want to repeat the biopsy to confirm for sure before treatment. Will defer to her when she is back  MAs please call. Thank you!

## 2022-04-29 NOTE — Telephone Encounter (Signed)
Advised pt of bx results.  Advised we will call back after Dr. Nicole Kindred reviews./sh

## 2022-05-05 ENCOUNTER — Telehealth: Payer: Self-pay

## 2022-05-05 NOTE — Telephone Encounter (Signed)
Lft pt message to call to discuss treatment option recommend by Dr. Vira Blanco

## 2022-05-05 NOTE — Telephone Encounter (Signed)
-----   Message from Brendolyn Patty, MD sent at 05/05/2022  9:40 AM EDT ----- He needs excision to remove rest of atypical lesion, probable cystic SCC   - please call patient

## 2022-05-05 NOTE — Telephone Encounter (Addendum)
Patient advised of biopsy results of the right medial cheek and excision scheduled for 05/26/22 at 1:30 PM.

## 2022-05-20 DIAGNOSIS — H35423 Microcystoid degeneration of retina, bilateral: Secondary | ICD-10-CM | POA: Diagnosis not present

## 2022-05-20 DIAGNOSIS — H35433 Paving stone degeneration of retina, bilateral: Secondary | ICD-10-CM | POA: Diagnosis not present

## 2022-05-20 DIAGNOSIS — H353134 Nonexudative age-related macular degeneration, bilateral, advanced atrophic with subfoveal involvement: Secondary | ICD-10-CM | POA: Diagnosis not present

## 2022-05-20 DIAGNOSIS — H31092 Other chorioretinal scars, left eye: Secondary | ICD-10-CM | POA: Diagnosis not present

## 2022-05-26 ENCOUNTER — Ambulatory Visit (INDEPENDENT_AMBULATORY_CARE_PROVIDER_SITE_OTHER): Payer: Medicare Other | Admitting: Family Medicine

## 2022-05-26 ENCOUNTER — Encounter: Payer: Medicare Other | Admitting: Dermatology

## 2022-05-26 ENCOUNTER — Encounter: Payer: Self-pay | Admitting: Family Medicine

## 2022-05-26 VITALS — BP 136/80 | HR 70 | Ht 71.0 in | Wt 203.0 lb

## 2022-05-26 DIAGNOSIS — H353130 Nonexudative age-related macular degeneration, bilateral, stage unspecified: Secondary | ICD-10-CM | POA: Diagnosis not present

## 2022-05-26 DIAGNOSIS — N400 Enlarged prostate without lower urinary tract symptoms: Secondary | ICD-10-CM | POA: Diagnosis not present

## 2022-05-26 DIAGNOSIS — E786 Lipoprotein deficiency: Secondary | ICD-10-CM

## 2022-05-26 DIAGNOSIS — M1A9XX0 Chronic gout, unspecified, without tophus (tophi): Secondary | ICD-10-CM | POA: Diagnosis not present

## 2022-05-26 DIAGNOSIS — E039 Hypothyroidism, unspecified: Secondary | ICD-10-CM | POA: Diagnosis not present

## 2022-05-26 DIAGNOSIS — K5901 Slow transit constipation: Secondary | ICD-10-CM | POA: Diagnosis not present

## 2022-05-26 DIAGNOSIS — I10 Essential (primary) hypertension: Secondary | ICD-10-CM

## 2022-05-26 DIAGNOSIS — R7303 Prediabetes: Secondary | ICD-10-CM

## 2022-05-26 DIAGNOSIS — K5900 Constipation, unspecified: Secondary | ICD-10-CM | POA: Insufficient documentation

## 2022-05-26 DIAGNOSIS — I2510 Atherosclerotic heart disease of native coronary artery without angina pectoris: Secondary | ICD-10-CM

## 2022-05-26 DIAGNOSIS — D696 Thrombocytopenia, unspecified: Secondary | ICD-10-CM | POA: Insufficient documentation

## 2022-05-26 DIAGNOSIS — M545 Low back pain, unspecified: Secondary | ICD-10-CM | POA: Diagnosis not present

## 2022-05-26 LAB — CBC WITH DIFFERENTIAL/PLATELET
Basophils Absolute: 0 10*3/uL (ref 0.0–0.1)
Basophils Relative: 0.7 % (ref 0.0–3.0)
Eosinophils Absolute: 0.3 10*3/uL (ref 0.0–0.7)
Eosinophils Relative: 7.3 % — ABNORMAL HIGH (ref 0.0–5.0)
HCT: 39.1 % (ref 39.0–52.0)
Hemoglobin: 13 g/dL (ref 13.0–17.0)
Lymphocytes Relative: 30.8 % (ref 12.0–46.0)
Lymphs Abs: 1.4 10*3/uL (ref 0.7–4.0)
MCHC: 33.3 g/dL (ref 30.0–36.0)
MCV: 101.3 fl — ABNORMAL HIGH (ref 78.0–100.0)
Monocytes Absolute: 0.3 10*3/uL (ref 0.1–1.0)
Monocytes Relative: 6.6 % (ref 3.0–12.0)
Neutro Abs: 2.6 10*3/uL (ref 1.4–7.7)
Neutrophils Relative %: 54.6 % (ref 43.0–77.0)
Platelets: 126 10*3/uL — ABNORMAL LOW (ref 150.0–400.0)
RBC: 3.86 Mil/uL — ABNORMAL LOW (ref 4.22–5.81)
RDW: 14.1 % (ref 11.5–15.5)
WBC: 4.7 10*3/uL (ref 4.0–10.5)

## 2022-05-26 LAB — COMPREHENSIVE METABOLIC PANEL
ALT: 10 U/L (ref 0–53)
AST: 23 U/L (ref 0–37)
Albumin: 4.5 g/dL (ref 3.5–5.2)
Alkaline Phosphatase: 94 U/L (ref 39–117)
BUN: 14 mg/dL (ref 6–23)
CO2: 27 mEq/L (ref 19–32)
Calcium: 9.6 mg/dL (ref 8.4–10.5)
Chloride: 100 mEq/L (ref 96–112)
Creatinine, Ser: 0.98 mg/dL (ref 0.40–1.50)
GFR: 70.22 mL/min (ref >60.00)
Glucose, Bld: 93 mg/dL (ref 70–99)
Potassium: 4.5 mEq/L (ref 3.5–5.1)
Sodium: 134 mEq/L — ABNORMAL LOW (ref 135–145)
Total Bilirubin: 1 mg/dL (ref 0.2–1.2)
Total Protein: 6.8 g/dL (ref 6.0–8.3)

## 2022-05-26 LAB — TSH: TSH: 3.51 u[IU]/mL (ref 0.35–5.50)

## 2022-05-26 LAB — LIPID PANEL
Cholesterol: 70 mg/dL (ref 0–200)
HDL: 28.2 mg/dL — ABNORMAL LOW (ref >39.00)
LDL Cholesterol: 27 mg/dL (ref 0–99)
NonHDL: 41.38
Total CHOL/HDL Ratio: 2
Triglycerides: 70 mg/dL (ref 0.0–149.0)
VLDL: 14 mg/dL (ref 0.0–40.0)

## 2022-05-26 LAB — URIC ACID: Uric Acid, Serum: 4.3 mg/dL (ref 4.0–7.8)

## 2022-05-26 LAB — HEMOGLOBIN A1C: Hgb A1c MFr Bld: 5.4 % (ref 4.6–6.5)

## 2022-05-26 NOTE — Assessment & Plan Note (Signed)
Lab Results  Component Value Date   HGBA1C 5.3 05/23/2021  lab today  disc imp of low glycemic diet and wt loss to prevent DM2

## 2022-05-26 NOTE — Assessment & Plan Note (Signed)
bp in fair control at this time  BP Readings from Last 1 Encounters:  05/26/22 136/80   No changes needed Most recent labs reviewed  Disc lifstyle change with low sodium diet and exercise  Plan to continue Lisinopril 10 mg daily  Metoprolol xl 12.5 mg daily

## 2022-05-26 NOTE — Assessment & Plan Note (Signed)
Under care of cardiology  plavix and asa  Nl EF last echo metoprolol

## 2022-05-26 NOTE — Assessment & Plan Note (Signed)
progressing 

## 2022-05-26 NOTE — Assessment & Plan Note (Signed)
No flares on allopurinol   Lab for cmet and uric acid today

## 2022-05-26 NOTE — Assessment & Plan Note (Signed)
More straining recently  Recommend inc fluid intake  High fiber diet  Can try miralax daily as directed  Update if not starting to improve in a week or if worsening

## 2022-05-26 NOTE — Progress Notes (Signed)
Subjective:    Patient ID: Jamie Byrd, male    DOB: 1933-11-05, 88 y.o.   MRN: [id number]  HPI Pt presents for annual f/u of chronic medical problems   Wt Readings from Last 3 Encounters:  05/26/22 203 lb (92.1 kg)  03/06/22 211 lb 12.8 oz (96.1 kg)  02/12/22 212 lb (96.2 kg)   28.31 kg/m Eating less   Taking care of himself  Works in garden and yard  Careful in the [employer] History  Administered Date(s) Administered   Fluad Quad(high Dose 65+) 07/15/2019, 07/19/2020   Influenza Split 09/17/2011, 07/23/2012   Influenza Whole 08/25/2006, 09/10/2009, 09/25/2010   Influenza, High Dose Seasonal PF 07/29/2018   Influenza, Seasonal, Injecte, Preservative Fre 06/28/2015   Influenza,inj,Quad PF,6+ Mos 07/07/2013, 07/19/2014, 08/08/2016, 07/16/2017   Influenza-Unspecified 06/28/2015   PFIZER(Purple Top)SARS-COV-2 Vaccination 12/06/2019, 12/27/2019   Pneumococcal Conjugate-13 12/11/2014   Pneumococcal Polysaccharide-23 09/17/2011   Td 07/14/1999, 09/10/2009   Health Maintenance Due  Topic Date Due   Zoster Vaccines- Shingrix (1 of 2) Never done   COVID-19 Vaccine (3 - Pfizer risk series) 01/24/2020   INFLUENZA VACCINE  05/13/2022   Gets a flu shot in the fall   Declines shingrix  Some back pain  Some constipation issues - a lot of grunting and straining   Gets flu shot every fall  Has had covid imms Tetanus shot postponed for insurance   Out aged colon screening  Last colonoscopy 2014  Derm care: has been to derm regularly  Some basal cell cancers Going back in 2 wk to remove one   Prostate health-h/o BPH Lab Results  Component Value Date   PSA 0.59 12/18/2017   PSA 0.56 12/17/2016   PSA 0.48 12/13/2015  Urgency of urination occ- no change at all   Eye care Macular degeneration  Had visit 2 wk ago  Progressive but sees enough to get around Cannot drive  It is frustrated   HTN bp is stable today  No cp or palpitations or headaches or  edema  No side effects to medicines  BP Readings from Last 3 Encounters:  05/26/22 136/80  03/06/22 128/74  11/06/21 136/78     Lisinopril 10 mg daily  Metoprolol xl 12.5 mg daily   Pulse Readings from Last 3 Encounters:  05/26/22 70  03/06/22 66  11/06/21 65   Diet is not always optimal  Ham Elsie Amis Soup prn  Wife is sick so less cooking  Appetite is down a bit also   H/o CAD (unstable angina in December)  Taking plavix and asa until December  and statin  Nl LVEF on echo  Noted mild aortic root dilation - monitoring   Doing well with this   Hypothyroidism  Pt has no clinical changes No change in energy level/ hair or skin/ edema and no tremor Energy is good for age  Lab Results  Component Value Date   TSH 5.52 (H) 05/23/2021    Taking levothyroxine 88 mcg daily  Due for labs today   H/o gout  Allopurinol 100 mg daily  Lab Results  Component Value Date   LABURIC 4.5 05/23/2021   No flares at all    H/o hyperlipidemia with low HDL Lab Results  Component Value Date   CHOL 117 09/25/2021   CHOL 116 05/23/2021   CHOL 105 05/17/2020   Lab Results  Component Value Date   HDL 33 (L) 09/25/2021   HDL 29.70 (L) 05/23/2021   HDL  29.10 (L) 05/17/2020   Lab Results  Component Value Date   LDLCALC 72 09/25/2021   LDLCALC 58 05/23/2021   LDLCALC 59 05/17/2020   Lab Results  Component Value Date   TRIG 58 09/25/2021   TRIG 139.0 05/23/2021   TRIG 89.0 05/17/2020   Lab Results  Component Value Date   CHOLHDL 3.5 09/25/2021   CHOLHDL 4 05/23/2021   CHOLHDL 4 05/17/2020   No results found for: "LDLDIRECT" Due for labs  LDL is well controlled with crestor 20 mg daily  HDL went up a bit - he is active   Prediabetes Lab Results  Component Value Date   HGBA1C 5.3 05/23/2021    Patient Active Problem List   Diagnosis Date Noted   CAD (coronary artery disease) 05/26/2022   Low back pain 05/21/2018   Macular degeneration 12/18/2017   Hearing loss  of aging 12/09/2013   Encounter for Medicare annual wellness exam 12/09/2013   Personal history of colonic polyps 12/08/2012   Prostate cancer screening 09/10/2011   Hyperkalemia 09/25/2010   Prediabetes 09/25/2010   Low HDL (under 40) 11/05/2009   Hypothyroidism 07/28/2007   Gout 07/28/2007   ERECTILE DYSFUNCTION 07/28/2007   Essential hypertension 07/28/2007   Benign prostatic hyperplasia 07/28/2007   Past Medical History:  Diagnosis Date   BCC (basal cell carcinoma) 09/12/2021   right neck posterior EDC 11/21/2021   BCC (basal cell carcinoma) 09/12/2021   right temple, EDC 02/18/2022   BCC (basal cell carcinoma) 09/12/2021   left temple, Mohs 12/18/21   BCC (basal cell carcinoma) 09/12/2021   left zygoma, Clear with biopsy 02/18/2022   BCC (basal cell carcinoma) 10/24/2021   right nasolabial fold, MOHs 01/01/2022   BPH (benign prostatic hypertrophy)    Difficulty reading    Difficulty seeing    Difficulty in seeing certain things and reading/ not legally blind!   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   right neck anterior, well differentiated, base involved EDC 11/21/2021   Squamous cell carcinoma of skin 11/21/2021   right dorsal hand, EDC   Squamous cell carcinoma of skin 04/21/2022   R med cheek, Atypical Squamous Proliferation w/Cystic Change, exc sched 05/19/22 at 1:30 PM   Past Surgical History:  Procedure Laterality Date   COLONOSCOPY     CORONARY ATHERECTOMY N/A 09/26/2021   Procedure: CORONARY ATHERECTOMY;  Surgeon: Nigel Mormon, MD;  Location: [facility];  Service: Cardiovascular;  Laterality: N/A;   CORONARY STENT INTERVENTION N/A 09/26/2021   Procedure: CORONARY STENT INTERVENTION;  Surgeon: Nigel Mormon, MD;  Location: [facility];  Service: Cardiovascular;  Laterality: N/A;   INTRAVASCULAR IMAGING/OCT N/A 09/26/2021   Procedure:  INTRAVASCULAR IMAGING/OCT;  Surgeon: Nigel Mormon, MD;  Location: [facility];  Service: Cardiovascular;  Laterality: N/A;   LEFT HEART CATH AND CORONARY ANGIOGRAPHY N/A 09/25/2021   Procedure: LEFT HEART CATH AND CORONARY ANGIOGRAPHY;  Surgeon: Nigel Mormon, MD;  Location: [facility];  Service: Cardiovascular;  Laterality: N/A;   POLYPECTOMY     Social History   Tobacco Use   Smoking status: Former    Packs/day: 1.50    Years: 15.00    Total pack years: 22.50    Types: Cigarettes    Quit date: 10/13/1968    Years since quitting: 53.6   Smokeless tobacco:  Never  Vaping Use   Vaping Use: Never used  Substance Use Topics   Alcohol use: No    Comment: no alcohol in 20 years   Drug use: No   Family History  Problem Relation Age of Onset   Heart disease Mother        CABG   Heart disease Father        CAD   Cancer Father        lung CA   Cancer Brother        lung CA   No Known Allergies Current Outpatient Medications on File Prior to Visit  Medication Sig Dispense Refill   allopurinol (ZYLOPRIM) 100 MG tablet Take 1 tablet (100 mg total) by mouth daily. 90 tablet 3   aspirin 81 MG EC tablet Take 1 tablet (81 mg total) by mouth daily. Swallow whole. 30 tablet 2   clopidogrel (PLAVIX) 75 MG tablet TAKE 1 TABLET BY MOUTH ONCE A DAY 90 tablet 3   levothyroxine (SYNTHROID) 88 MCG tablet Take 1 tablet (88 mcg total) by mouth daily. 90 tablet 3   lisinopril (ZESTRIL) 10 MG tablet Take 10 mg by mouth daily.     metoprolol succinate (TOPROL-XL) 25 MG 24 hr tablet Take 0.5 tablets (12.5 mg total) by mouth daily. Take with or immediately following a meal. 45 tablet 3   rosuvastatin (CRESTOR) 20 MG tablet Take 1 tablet (20 mg total) by mouth daily. 90 tablet 3   nitroGLYCERIN (NITROSTAT) 0.4 MG SL tablet Place 1 tablet (0.4 mg total) under the tongue every 5 (five) minutes as needed for chest pain. 30 tablet 2   No current facility-administered medications  on file prior to visit.     Review of Systems  Constitutional:  Negative for activity change, appetite change, fatigue, fever and unexpected weight change.  HENT:  Negative for congestion, rhinorrhea, sore throat and trouble swallowing.   Eyes:  Positive for visual disturbance. Negative for pain, redness and itching.  Respiratory:  Negative for cough, chest tightness, shortness of breath and wheezing.   Cardiovascular:  Negative for chest pain and palpitations.  Gastrointestinal:  Negative for abdominal pain, blood in stool, constipation, diarrhea and nausea.  Endocrine: Negative for cold intolerance, heat intolerance, polydipsia and polyuria.  Genitourinary:  Negative for difficulty urinating, dysuria, frequency and urgency.  Musculoskeletal:  Positive for back pain. Negative for arthralgias, joint swelling and myalgias.  Skin:  Negative for pallor and rash.  Neurological:  Negative for dizziness, tremors, weakness, numbness and headaches.  Hematological:  Negative for adenopathy. Does not bruise/bleed easily.  Psychiatric/Behavioral:  Negative for decreased concentration and dysphoric mood. The patient is not nervous/anxious.        Objective:   Physical Exam Constitutional:      General: He is not in acute distress.    Appearance: Normal appearance. He is well-developed and normal weight. He is not ill-appearing or diaphoretic.  HENT:     Head: Normocephalic and atraumatic.     Right Ear: Tympanic membrane, ear canal and external ear normal.     Left Ear: Tympanic membrane, ear canal and external ear normal.     Ears:     Comments: Hard of hearing     Nose: Nose normal. No congestion.     Mouth/Throat:     Mouth: Mucous membranes are moist.     Pharynx: Oropharynx is clear. No posterior oropharyngeal erythema.  Eyes:     General: No scleral icterus.  Right eye: No discharge.        Left eye: No discharge.     Conjunctiva/sclera: Conjunctivae normal.     Pupils: Pupils  are equal, round, and reactive to light.  Neck:     Thyroid: No thyromegaly.     Vascular: No carotid bruit or JVD.  Cardiovascular:     Rate and Rhythm: Normal rate and regular rhythm.     Pulses: Normal pulses.     Heart sounds: Normal heart sounds.     No gallop.  Pulmonary:     Effort: Pulmonary effort is normal. No respiratory distress.     Breath sounds: Normal breath sounds. No wheezing or rales.     Comments: Good air exch Chest:     Chest wall: No tenderness.  Abdominal:     General: Bowel sounds are normal. There is no distension or abdominal bruit.     Palpations: Abdomen is soft. There is no mass.     Tenderness: There is no abdominal tenderness.     Hernia: No hernia is present.  Musculoskeletal:        General: No tenderness.     Cervical back: Normal range of motion and neck supple. No rigidity. No muscular tenderness.     Right lower leg: No edema.     Left lower leg: No edema.  Lymphadenopathy:     Cervical: No cervical adenopathy.  Skin:    General: Skin is warm and dry.     Coloration: Skin is not pale.     Findings: No erythema or rash.     Comments: SKs Solar lentigines diffusely   Neurological:     Mental Status: He is alert.     Cranial Nerves: No cranial nerve deficit.     Motor: No abnormal muscle tone.     Coordination: Coordination normal.     Gait: Gait normal.     Deep Tendon Reflexes: Reflexes are normal and symmetric. Reflexes normal.  Psychiatric:        Mood and Affect: Mood normal.        Cognition and Memory: Cognition and memory normal.           Assessment & Plan:   Problem List Items Addressed This Visit       Cardiovascular and Mediastinum   CAD (coronary artery disease)    Under care of cardiology  plavix and asa  Nl EF last echo metoprolol      Essential hypertension - Primary    bp in fair control at this time  BP Readings from Last 1 Encounters:  05/26/22 136/80  No changes needed Most recent labs reviewed   Disc lifstyle change with low sodium diet and exercise  Plan to continue Lisinopril 10 mg daily  Metoprolol xl 12.5 mg daily      Relevant Orders   TSH   Lipid panel   Comprehensive metabolic panel   CBC with Differential/Platelet     Endocrine   Hypothyroidism    TSH today  No clinical changes Levothyroxine 88 mcg daily  Last tsh was elevated        Genitourinary   Benign prostatic hyperplasia    No clinical changes        Other   Constipation    More straining recently  Recommend inc fluid intake  High fiber diet  Can try miralax daily as directed  Update if not starting to improve in a week or if worsening  Gout    No flares on allopurinol   Lab for cmet and uric acid today      Relevant Orders   Uric acid   Low back pain    Flared lately  Given handout for rehab exercises  Due to vision imp-will have someone rev with him inst to f/u if not imp      Low HDL (under 40)    Lab today  Disc goals for lipids and reasons to control them Rev last labs with pt Rev low sat fat diet in detail On crestor 20 daily  Has CAD dx      Macular degeneration    progressing      Prediabetes    Lab Results  Component Value Date   HGBA1C 5.3 05/23/2021  lab today  disc imp of low glycemic diet and wt loss to prevent DM2       Relevant Orders   Hemoglobin A1c

## 2022-05-26 NOTE — Assessment & Plan Note (Signed)
No clinical changes 

## 2022-05-26 NOTE — Assessment & Plan Note (Signed)
Lab today  Disc goals for lipids and reasons to control them Rev last labs with pt Rev low sat fat diet in detail On crestor 20 daily  Has CAD dx

## 2022-05-26 NOTE — Assessment & Plan Note (Signed)
Flared lately  Given handout for rehab exercises  Due to vision imp-will have someone rev with him inst to f/u if not imp

## 2022-05-26 NOTE — Assessment & Plan Note (Signed)
TSH today  No clinical changes Levothyroxine 88 mcg daily  Last tsh was elevated

## 2022-05-26 NOTE — Patient Instructions (Addendum)
For constipation make sure you drink lots of fluids /water   Aim for 64 oz of fluid daily   You can try miralax over the counter-once daily as needed (you can take it daily )   Labs today    Try the exercises for back pain  If it does not improve come back so we can talk about it

## 2022-06-09 ENCOUNTER — Ambulatory Visit (INDEPENDENT_AMBULATORY_CARE_PROVIDER_SITE_OTHER): Payer: Medicare Other | Admitting: Dermatology

## 2022-06-09 DIAGNOSIS — C44329 Squamous cell carcinoma of skin of other parts of face: Secondary | ICD-10-CM | POA: Diagnosis not present

## 2022-06-09 DIAGNOSIS — C44320 Squamous cell carcinoma of skin of unspecified parts of face: Secondary | ICD-10-CM

## 2022-06-09 NOTE — Patient Instructions (Signed)
Wound Care Instructions for After Surgery  On the day following your surgery, you should begin doing daily dressing changes until your sutures are removed: Remove the bandage. Cleanse the wound gently with soap and water.  Make sure you then dry the skin surrounding the wound completely or the tape will not stick to the skin. Do not use cotton balls on the wound. After the wound is clean and dry, apply the ointment (either prescription antibiotic prescribed by your doctor or plain Vaseline if nothing was prescribed) gently with a Q-tip. If you are using a bandaid to cover: Apply a bandaid large enough to cover the entire wound. If you do not have a bandaid large enough to cover the wound OR if you are sensitive to bandaid adhesive: Cut a non-stick pad (such as Telfa) to fit the size of the wound.  Cover the wound with the non-stick pad. If the wound is draining, you may want to add a small amount of gauze on top of the non-stick pad for a little added compression to the area. Use tape to seal the area completely.  For the next 1-2 weeks: Be sure to keep the wound moist with ointment 24/7 to ensure best healing. If you are unable to cover the wound with a bandage to hold the ointment in place, you may need to reapply the ointment several times a day. Do not bend over or lift heavy items to reduce the chance of elevated blood pressure to the wound. Do not participate in particularly strenuous activities.  Below is a list of dressing supplies you might need.  Cotton-tipped applicators - Q-tips Gauze pads (2x2 and/or 4x4) - All-Purpose Sponges New and clean tube of petroleum jelly (Vaseline) OR prescription antibiotic ointment if prescribed Either a bandaid large enough to cover the entire wound OR non-stick dressing material (Telfa) and Tape (Paper or Hypafix)  FOR ADULT SURGERY PATIENTS: If you need something for pain relief, you may take 1 extra strength Tylenol (acetaminophen) and 2  ibuprofen (200 mg) together every 4 hours as needed. (Do not take these medications if you are allergic to them or if you know you cannot take them for any other reason). Typically you may only need pain medication for 1-3 days.   Comments on the Post-Operative Period Slight swelling and redness often appear around the wound. This is normal and will disappear within several days following the surgery. The healing wound will drain a brownish-red-yellow discharge during healing. This is a normal phase of wound healing. As the wound begins to heal, the drainage may increase in amount. Again, this drainage is normal. Notify us if the drainage becomes persistently bloody, excessively swollen, or intensely painful or develops a foul odor or red streaks.  The healing wound will also typically be itchy. This is normal. If you have severe or persistent pain, Notify us if the discomfort is severe or persistent. Avoid alcoholic beverages when taking pain medicine.  In Case of Wound Hemorrhage A wound hemorrhage is when the bandage suddenly becomes soaked with bright red blood and flows profusely. If this happens, sit down or lie down with your head elevated. If the wound has a dressing on it, do not remove the dressing. Apply pressure to the existing gauze. If the wound is not covered, use a gauze pad to apply pressure and continue applying the pressure for 20 minutes without peeking. DO NOT COVER THE WOUND WITH A LARGE TOWEL OR WASH CLOTH. Release your hand from the   wound site but do not remove the dressing. If the bleeding has stopped, gently clean around the wound. Leave the dressing in place for 24 hours if possible. This wait time allows the blood vessels to close off so that you do not spark a new round of bleeding by disrupting the newly clotted blood vessels with an immediate dressing change. If the bleeding does not subside, continue to hold pressure for 40 minutes. If bleeding continues, page your  physician, contact an After Hours clinic or go to the Emergency Room.

## 2022-06-09 NOTE — Progress Notes (Signed)
Follow-Up Visit   Subjective  Jamie Byrd is a 88 y.o. male who presents for the following: Procedure (ATYPICAL SQUAMOUS PROLIFERATION WITH CYSTIC CHANGE, BASE INVOLVED. Patient presents for excision./ ).   The following portions of the chart were reviewed this encounter and updated as appropriate:       Review of Systems:  No other skin or systemic complaints except as noted in HPI or Assessment and Plan.  Objective  Well appearing patient in no apparent distress; mood and affect are within normal limits.  A focused examination was performed including face. Relevant physical exam findings are noted in the Assessment and Plan.  Right Medial Cheek 0.6 cm keratotic papule    Assessment & Plan  SCC (squamous cell carcinoma), face Right Medial Cheek  Skin excision  Lesion length (cm):  0.6 Lesion width (cm):  0.6 Margin per side (cm):  0.2 Total excision diameter (cm):  1 Informed consent: discussed and consent obtained   Timeout: patient name, date of birth, surgical site, and procedure verified   Procedure prep:  Patient was prepped and draped in usual sterile fashion Prep type:  Povidone-iodine Anesthesia: the lesion was anesthetized in a standard fashion   Anesthetic:  1% lidocaine w/ epinephrine 1-100,000 buffered w/ 8.4% NaHCO3 (Total 6cc - 3cc lido w/epi, 3cc bupivicaine) Instrument used comment:  #15 c blade Hemostasis achieved with: pressure and electrodesiccation   Outcome: patient tolerated procedure well with no complications   Additional details:  Tagged at superior 12 o'clock tip  Skin repair Complexity:  Complex Final length (cm):  2.2 Informed consent: discussed and consent obtained   Timeout: patient name, date of birth, surgical site, and procedure verified   Reason for type of repair: reduce tension to allow closure, reduce the risk of dehiscence, infection, and necrosis, reduce subcutaneous dead space and avoid a hematoma, preserve normal  anatomy, preserve normal anatomical and functional relationships and enhance both functionality and cosmetic results   Undermining: area extensively undermined   Undermining comment:  1.0 cm Subcutaneous layers (deep stitches):  Suture size:  5-0 Suture type: Vicryl (polyglactin 910)   Stitches:  Buried vertical mattress Fine/surface layer approximation (top stitches):  Suture size:  5-0 Suture type: nylon   Suture type comment:  Nylon Stitches: simple interrupted   Suture removal (days):  7 Hemostasis achieved with: suture and pressure Outcome: patient tolerated procedure well with no complications   Post-procedure details: sterile dressing applied and wound care instructions given   Dressing type: pressure dressing (mupirocin)    Specimen 1 - Surgical pathology Differential Diagnosis: Atypical Squamous Proliferation with Cystic Change Check Margins: Yes 0.6 cm keratotic papule [id number] Tagged at superior 12 o'clock tip  Atypical Squamous Proliferation with cystic change, base involved- excised today  Actinic Damage - chronic, secondary to cumulative UV radiation exposure/sun exposure over time - diffuse scaly erythematous macules with underlying dyspigmentation - Recommend daily broad spectrum sunscreen SPF 30+ to sun-exposed areas, reapply every 2 hours as needed.  - Recommend staying in the shade or wearing long sleeves, sun glasses (UVA+UVB protection) and wide brim hats (4-inch brim around the entire circumference of the hat). - Call for new or changing lesions.  Return in about 8 days (around 06/17/2022) for suture removal and treat ISK L upper antecubitum and R nasolabial.  I, Jamesetta Orleans, CMA, am acting as scribe for Brendolyn Patty, MD .  Documentation: I have reviewed the above documentation for accuracy and completeness, and I agree with the above.  Brendolyn Patty MD

## 2022-06-10 ENCOUNTER — Other Ambulatory Visit: Payer: Self-pay | Admitting: Family Medicine

## 2022-06-10 ENCOUNTER — Telehealth: Payer: Self-pay

## 2022-06-10 NOTE — Telephone Encounter (Signed)
Left message for patient to call with any problems from surgery yesterday.

## 2022-06-17 ENCOUNTER — Ambulatory Visit (INDEPENDENT_AMBULATORY_CARE_PROVIDER_SITE_OTHER): Payer: Medicare Other | Admitting: Dermatology

## 2022-06-17 DIAGNOSIS — C4492 Squamous cell carcinoma of skin, unspecified: Secondary | ICD-10-CM

## 2022-06-17 DIAGNOSIS — L570 Actinic keratosis: Secondary | ICD-10-CM | POA: Diagnosis not present

## 2022-06-17 DIAGNOSIS — C44329 Squamous cell carcinoma of skin of other parts of face: Secondary | ICD-10-CM | POA: Diagnosis not present

## 2022-06-17 DIAGNOSIS — L820 Inflamed seborrheic keratosis: Secondary | ICD-10-CM

## 2022-06-17 NOTE — Progress Notes (Signed)
Follow-Up Visit   Subjective  Jamie Byrd is a 88 y.o. male who presents for the following: Post op (SCC with positive deep margin, right medial cheek) and Inflamed SKs (L upper antecubitum, R nasolabial. Treat today. ).  They get sore and are irritating.   The following portions of the chart were reviewed this encounter and updated as appropriate:       Review of Systems:  No other skin or systemic complaints except as noted in HPI or Assessment and Plan.  Objective  Well appearing patient in no apparent distress; mood and affect are within normal limits.  A focused examination was performed including face, hands. Relevant physical exam findings are noted in the Assessment and Plan.  Right Medial Cheek Healing excision site. site is clean, dry and intact   Left Upper Antecubital x 1 Erythematous stuck-on, waxy papule  R nasolabial x 1, R upper lip x 1, R infraocular x 1, R zygoma x 2, R cheek x 2, R jaw x 4, L med cheek x 1, L nasal dorsum x 1, L nasal ala x 1, L zygoma x 4, L hand dorsum x 3 (21) Pink scaly macules.    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 Healing well.  Wound cleansed, sutures removed, wound cleansed and steri strips applied. Discussed pathology results. Patient agrees with decision to observe site for now. If recurs, will schedule for Mohs surgery.    Inflamed seborrheic keratosis Left Upper Antecubital x 1  Symptomatic, irritating, patient would like treated.    Destruction of lesion - Left Upper Antecubital x 1  Destruction method: cryotherapy   Informed consent: discussed and consent obtained   Lesion destroyed using liquid nitrogen: Yes   Region frozen until ice ball extended beyond lesion: Yes   Outcome: patient tolerated procedure well with no complications   Post-procedure details: wound care instructions given   Additional details:  Prior to procedure, discussed  risks of blister formation, small wound, skin dyspigmentation, or rare scar following cryotherapy. Recommend Vaseline ointment to treated areas while healing.   AK (actinic keratosis) (21) R nasolabial x 1, R upper lip x 1, R infraocular x 1, R zygoma x 2, R cheek x 2, R jaw x 4, L med cheek x 1, L nasal dorsum x 1, L nasal ala x 1, L zygoma x 4, L hand dorsum x 3  Recheck right nasolabial on follow-up.  Actinic keratoses are precancerous spots that appear secondary to cumulative UV radiation exposure/sun exposure over time. They are chronic with expected duration over 1 year. A portion of actinic keratoses will progress to squamous cell carcinoma of the skin. It is not possible to reliably predict which spots will progress to skin cancer and so treatment is recommended to prevent development of skin cancer.  Recommend daily broad spectrum sunscreen SPF 30+ to sun-exposed areas, reapply every 2 hours as needed.  Recommend staying in the shade or wearing long sleeves, sun glasses (UVA+UVB protection) and wide brim hats (4-inch brim around the entire circumference of the hat). Call for new or changing lesions.  Destruction of lesion - R nasolabial x 1, R upper lip x 1, R infraocular x 1, R zygoma x 2, R cheek x 2, R jaw x 4, L med cheek x 1, L nasal dorsum x 1, L nasal ala x 1, L zygoma x 4, L hand dorsum x 3  Destruction method:  cryotherapy   Informed consent: discussed and consent obtained   Lesion destroyed using liquid nitrogen: Yes   Region frozen until ice ball extended beyond lesion: Yes   Outcome: patient tolerated procedure well with no complications   Post-procedure details: wound care instructions given   Additional details:  Prior to procedure, discussed risks of blister formation, small wound, skin dyspigmentation, or rare scar following cryotherapy. Recommend Vaseline ointment to treated areas while healing.    Return in about 2 months (around 08/17/2022), or -3 months, for AKs,  recheck right nasolabial, Hx SCC.  IJamesetta Orleans, CMA, am acting as scribe for Brendolyn Patty, MD .  Documentation: I have reviewed the above documentation for accuracy and completeness, and I agree with the above.  Brendolyn Patty MD

## 2022-06-17 NOTE — Patient Instructions (Addendum)
Cryotherapy Aftercare  Wash gently with soap and water everyday.   Apply Vaseline and Band-Aid daily until healed.     Due to recent changes in healthcare laws, you may see results of your pathology and/or laboratory studies on MyChart before the doctors have had a chance to review them. We understand that in some cases there may be results that are confusing or concerning to you. Please understand that not all results are received at the same time and often the doctors may need to interpret multiple results in order to provide you with the best plan of care or course of treatment. Therefore, we ask that you please give us 2 business days to thoroughly review all your results before contacting the office for clarification. Should we see a critical lab result, you will be contacted sooner.   If You Need Anything After Your Visit  If you have any questions or concerns for your doctor, please call our main line at [phone] and press option 4 to reach your doctor's medical assistant. If no one answers, please leave a voicemail as directed and we will return your call as soon as possible. Messages left after 4 pm will be answered the following business day.   You may also send us a message via MyChart. We typically respond to MyChart messages within 1-2 business days.  For prescription refills, please ask your pharmacy to contact our office. Our fax number is [phone].  If you have an urgent issue when the clinic is closed that cannot wait until the next business day, you can page your doctor at the number below.    Please note that while we do our best to be available for urgent issues outside of office hours, we are not available 24/7.   If you have an urgent issue and are unable to reach us, you may choose to seek medical care at your doctor's office, retail clinic, urgent care center, or emergency room.  If you have a medical emergency, please immediately call 911 or go to the  emergency department.  Pager Numbers  - Dr. Kowalski: [phone]  - Dr. Moye: [phone]  - Dr. Stewart: [phone]  In the event of inclement weather, please call our main line at [phone] for an update on the status of any delays or closures.  Dermatology Medication Tips: Please keep the boxes that topical medications come in in order to help keep track of the instructions about where and how to use these. Pharmacies typically print the medication instructions only on the boxes and not directly on the medication tubes.   If your medication is too expensive, please contact our office at [phone] option 4 or send us a message through MyChart.   We are unable to tell what your co-pay for medications will be in advance as this is different depending on your insurance coverage. However, we may be able to find a substitute medication at lower cost or fill out paperwork to get insurance to cover a needed medication.   If a prior authorization is required to get your medication covered by your insurance company, please allow us 1-2 business days to complete this process.  Drug prices often vary depending on where the prescription is filled and some pharmacies may offer cheaper prices.  The website www.goodrx.com contains coupons for medications through different pharmacies. The prices here do not account for what the cost may be with help from insurance (it may be cheaper with your insurance), but the website can   give you the price if you did not use any insurance.  - You can print the associated coupon and take it with your prescription to the pharmacy.  - You may also stop by our office during regular business hours and pick up a GoodRx coupon card.  - If you need your prescription sent electronically to a different pharmacy, notify our office through  MyChart or by phone at [phone] option 4.     Si Usted Necesita Algo Despus de Su Visita  Tambin puede  enviarnos un mensaje a travs de MyChart. Por lo general respondemos a los mensajes de MyChart en el transcurso de 1 a 2 das hbiles.  Para renovar recetas, por favor pida a su farmacia que se ponga en contacto con nuestra oficina. Nuestro nmero de fax es el [phone].  Si tiene un asunto urgente cuando la clnica est cerrada y que no puede esperar hasta el siguiente da hbil, puede llamar/localizar a su doctor(a) al nmero que aparece a continuacin.   Por favor, tenga en cuenta que aunque hacemos todo lo posible para estar disponibles para asuntos urgentes fuera del horario de oficina, no estamos disponibles las 24 horas del da, los 7 das de la semana.   Si tiene un problema urgente y no puede comunicarse con nosotros, puede optar por buscar atencin mdica  en el consultorio de su doctor(a), en una clnica privada, en un centro de atencin urgente o en una sala de emergencias.  Si tiene una emergencia mdica, por favor llame inmediatamente al 911 o vaya a la sala de emergencias.  Nmeros de bper  - Dr. Kowalski: [phone]  - Dra. Moye: [phone]  - Dra. Stewart: [phone]  En caso de inclemencias del tiempo, por favor llame a nuestra lnea principal al [phone] para una actualizacin sobre el estado de cualquier retraso o cierre.  Consejos para la medicacin en dermatologa: Por favor, guarde las cajas en las que vienen los medicamentos de uso tpico para ayudarle a seguir las instrucciones sobre dnde y cmo usarlos. Las farmacias generalmente imprimen las instrucciones del medicamento slo en las cajas y no directamente en los tubos del medicamento.   Si su medicamento es muy caro, por favor, pngase en contacto con nuestra oficina llamando al [phone] y presione la opcin 4 o envenos un mensaje a travs de MyChart.   No podemos decirle cul ser su copago por los medicamentos por adelantado ya que esto es diferente dependiendo de la cobertura de su seguro.  Sin embargo, es posible que podamos encontrar un medicamento sustituto a menor costo o llenar un formulario para que el seguro cubra el medicamento que se considera necesario.   Si se requiere una autorizacin previa para que su compaa de seguros cubra su medicamento, por favor permtanos de 1 a 2 das hbiles para completar este proceso.  Los precios de los medicamentos varan con frecuencia dependiendo del lugar de dnde se surte la receta y alguna farmacias pueden ofrecer precios ms baratos.  El sitio web www.goodrx.com tiene cupones para medicamentos de diferentes farmacias. Los precios aqu no tienen en cuenta lo que podra costar con la ayuda del seguro (puede ser ms barato con su seguro), pero el sitio web puede darle el precio si no utiliz ningn seguro.  - Puede imprimir el cupn correspondiente y llevarlo con su receta a la farmacia.  - Tambin puede pasar por nuestra oficina durante el horario de atencin regular y recoger una tarjeta de cupones de GoodRx.  -   Si necesita que su receta se enve electrnicamente a una farmacia diferente, informe a nuestra oficina a travs de MyChart de [place] o por telfono llamando al [phone] y presione la opcin 4.  

## 2022-06-25 ENCOUNTER — Other Ambulatory Visit: Payer: Self-pay | Admitting: Family Medicine

## 2022-07-07 ENCOUNTER — Encounter: Payer: Self-pay | Admitting: Family Medicine

## 2022-07-07 ENCOUNTER — Ambulatory Visit (INDEPENDENT_AMBULATORY_CARE_PROVIDER_SITE_OTHER): Payer: Medicare Other | Admitting: Family Medicine

## 2022-07-07 ENCOUNTER — Ambulatory Visit (INDEPENDENT_AMBULATORY_CARE_PROVIDER_SITE_OTHER)
Admission: RE | Admit: 2022-07-07 | Discharge: 2022-07-07 | Disposition: A | Discharge status: Home / Self Care | Payer: Medicare Other | Source: Ambulatory Visit | Attending: Family Medicine | Admitting: Family Medicine

## 2022-07-07 VITALS — BP 122/70 | HR 68 | Temp 97.7°F | Ht 71.0 in | Wt 197.2 lb

## 2022-07-07 DIAGNOSIS — I2510 Atherosclerotic heart disease of native coronary artery without angina pectoris: Secondary | ICD-10-CM | POA: Diagnosis not present

## 2022-07-07 DIAGNOSIS — R1011 Right upper quadrant pain: Secondary | ICD-10-CM | POA: Diagnosis not present

## 2022-07-07 DIAGNOSIS — R109 Unspecified abdominal pain: Secondary | ICD-10-CM | POA: Diagnosis not present

## 2022-07-07 DIAGNOSIS — R3129 Other microscopic hematuria: Secondary | ICD-10-CM | POA: Diagnosis not present

## 2022-07-07 DIAGNOSIS — M545 Low back pain, unspecified: Secondary | ICD-10-CM | POA: Diagnosis not present

## 2022-07-07 DIAGNOSIS — N2889 Other specified disorders of kidney and ureter: Secondary | ICD-10-CM | POA: Diagnosis not present

## 2022-07-07 DIAGNOSIS — K5901 Slow transit constipation: Secondary | ICD-10-CM

## 2022-07-07 DIAGNOSIS — R3915 Urgency of urination: Secondary | ICD-10-CM | POA: Diagnosis not present

## 2022-07-07 LAB — POCT UA - MICROSCOPIC ONLY

## 2022-07-07 LAB — POC URINALSYSI DIPSTICK (AUTOMATED)
Bilirubin, UA: NEGATIVE
Blood, UA: 25
Glucose, UA: NEGATIVE
Ketones, UA: NEGATIVE
Leukocytes, UA: NEGATIVE
Nitrite, UA: NEGATIVE
Protein, UA: NEGATIVE
Spec Grav, UA: 1.015 (ref 1.010–1.025)
Urobilinogen, UA: 0.2 E.U./dL
pH, UA: 6.5 (ref 5.0–8.0)

## 2022-07-07 NOTE — Assessment & Plan Note (Addendum)
R flank and mid to lower back pain  Some rbc in urine   KUB ordered  LS film ordered Pending result  Low threshold for CT scan to look for stone or other  Also some new constipation and wt loss  Enc good hydration  ER parameters reviewed

## 2022-07-07 NOTE — Assessment & Plan Note (Signed)
ua with rbc H/o BPH  Urine culture pending

## 2022-07-07 NOTE — Assessment & Plan Note (Signed)
Low to mid Worse on R Nl rom on exam  Some abd bloating and rbc in ua   Pending urine cx  KUB and LS film ordered  inst to call if this worsens  ER parameters discussed

## 2022-07-07 NOTE — Progress Notes (Signed)
Subjective:    Patient ID: Jamie Byrd, male    DOB: May 26, 1934, 88 y.o.   MRN: [id number]  HPI Pt presents for lower back pain with bowel issues   Wt Readings from Last 3 Encounters:  07/07/22 197 lb 4 oz (89.5 kg)  05/26/22 203 lb (92.1 kg)  03/06/22 211 lb 12.8 oz (96.1 kg)   27.51 kg/m   H/o constipation (last 3-4 months)  Bm about every 7-8 days  Some bloating and discomfort  No abdominal pain   Has to strain terribly   Tried miralax otc - did not help  Used it more than once daily   Tried some otc pills -  stimulant laxative Actually took 4 pills at once- did help with a bowel movement   Has been loosing weight  Does not have the appetite he used to  Eats fairly regularly  Less than a year ago   Some fruit/veg occ  Bananas Apples  Not as many veggies as a year ago  His wife no longer cooks   Not much fiber   Has not used milk of magnesia    Results for orders placed or performed in visit on 07/07/22  POCT Urinalysis Dipstick (Automated)  Result Value Ref Range   Color, UA Yellow    Clarity, UA Clear    Glucose, UA Negative Negative   Bilirubin, UA Negative    Ketones, UA Negative    Spec Grav, UA 1.015 1.010 - 1.025   Blood, UA 25 Ery/uL    pH, UA 6.5 5.0 - 8.0   Protein, UA Negative Negative   Urobilinogen, UA 0.2 0.2 or 1.0 E.U./dL   Nitrite, UA Negative    Leukocytes, UA Negative Negative  POCT UA - Microscopic Only  Result Value Ref Range   WBC, Ur, HPF, POC 0-1 0 - 5   RBC, Urine, Miroscopic 2-5 0 - 2   Bacteria, U Microscopic few None - Trace   Mucus, UA few    Epithelial cells, urine per micros few    Crystals, Ur, HPF, POC few    Casts, Ur, LPF, POC none    Yeast, UA none     Mid to low back pain  Right side is worse than the left   Not very positional- just all the time  Worse when standing for a long time  Some urinary urgency recently    Lab Results  Component Value Date   CREATININE 0.98 05/26/2022   BUN 14  05/26/2022   NA 134 (L) 05/26/2022   K 4.5 05/26/2022   CL 100 05/26/2022   CO2 27 05/26/2022   Lab Results  Component Value Date   WBC 4.7 05/26/2022   HGB 13.0 05/26/2022   HCT 39.1 05/26/2022   MCV 101.3 (H) 05/26/2022   PLT 126.0 (L) 05/26/2022    Patient Active Problem List   Diagnosis Date Noted   Microscopic hematuria 07/07/2022   Urgency of urination 07/07/2022   Flank pain 07/07/2022   CAD (coronary artery disease) 05/26/2022   Constipation 05/26/2022   Thrombocytopenia ([place]) 05/26/2022   Low back pain 05/21/2018   Macular degeneration 12/18/2017   Hearing loss of aging 12/09/2013   Encounter for Medicare annual wellness exam 12/09/2013   Personal history of colonic polyps 12/08/2012   Prostate cancer screening 09/10/2011   Hyperkalemia 09/25/2010   Prediabetes 09/25/2010   Low HDL (under 40) 11/05/2009   Hypothyroidism 07/28/2007   Gout 07/28/2007   ERECTILE DYSFUNCTION  07/28/2007   Essential hypertension 07/28/2007   Benign prostatic hyperplasia 07/28/2007   Past Medical History:  Diagnosis Date   BCC (basal cell carcinoma) 09/12/2021   right neck posterior EDC 11/21/2021   BCC (basal cell carcinoma) 09/12/2021   right temple, EDC 02/18/2022   BCC (basal cell carcinoma) 09/12/2021   left temple, Mohs 12/18/21   BCC (basal cell carcinoma) 09/12/2021   left zygoma, Clear with biopsy 02/18/2022   BCC (basal cell carcinoma) 10/24/2021   right nasolabial fold, MOHs 01/01/2022   BPH (benign prostatic hypertrophy)    Difficulty reading    Difficulty seeing    Difficulty in seeing certain things and reading/ not legally blind!   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   right neck anterior, well differentiated, base involved EDC 11/21/2021   Squamous cell carcinoma of skin 11/21/2021   right dorsal hand, EDC   Squamous cell carcinoma of skin 04/21/2022   R med cheek,  Atypical Squamous Proliferation w/Cystic Change, exc 06/09/2022   Past Surgical History:  Procedure Laterality Date   COLONOSCOPY     CORONARY ATHERECTOMY N/A 09/26/2021   Procedure: CORONARY ATHERECTOMY;  Surgeon: Nigel Mormon, MD;  Location: [facility];  Service: Cardiovascular;  Laterality: N/A;   CORONARY STENT INTERVENTION N/A 09/26/2021   Procedure: CORONARY STENT INTERVENTION;  Surgeon: Nigel Mormon, MD;  Location: [facility];  Service: Cardiovascular;  Laterality: N/A;   INTRAVASCULAR IMAGING/OCT N/A 09/26/2021   Procedure: INTRAVASCULAR IMAGING/OCT;  Surgeon: Nigel Mormon, MD;  Location: [facility];  Service: Cardiovascular;  Laterality: N/A;   LEFT HEART CATH AND CORONARY ANGIOGRAPHY N/A 09/25/2021   Procedure: LEFT HEART CATH AND CORONARY ANGIOGRAPHY;  Surgeon: Nigel Mormon, MD;  Location: [facility];  Service: Cardiovascular;  Laterality: N/A;   POLYPECTOMY     Social History   Tobacco Use   Smoking status: Former    Packs/day: 1.50    Years: 15.00    Total pack years: 22.50    Types: Cigarettes    Quit date: 10/13/1968    Years since quitting: 53.7   Smokeless tobacco: Never  Vaping Use   Vaping Use: Never used  Substance Use Topics   Alcohol use: No    Comment: no alcohol in 20 years   Drug use: No   Family History  Problem Relation Age of Onset   Heart disease Mother        CABG   Heart disease Father        CAD   Cancer Father        lung CA   Cancer Brother        lung CA   No Known Allergies Current Outpatient Medications on File Prior to Visit  Medication Sig Dispense Refill   allopurinol (ZYLOPRIM) 100 MG tablet TAKE 1 TABLET BY MOUTH ONCE A DAY 90 tablet 3   aspirin 81 MG EC tablet Take 1 tablet (81 mg total) by mouth daily. Swallow whole. 30 tablet 2   clopidogrel (PLAVIX) 75 MG tablet TAKE 1 TABLET BY MOUTH ONCE A DAY 90 tablet 3   levothyroxine (SYNTHROID) 88 MCG tablet Take 1 tablet  (88 mcg total) by mouth daily before breakfast. 90 tablet 3   lisinopril (ZESTRIL) 10 MG tablet Take 10 mg by mouth daily.  metoprolol succinate (TOPROL-XL) 25 MG 24 hr tablet Take 0.5 tablets (12.5 mg total) by mouth daily. Take with or immediately following a meal. 45 tablet 3   rosuvastatin (CRESTOR) 20 MG tablet Take 1 tablet (20 mg total) by mouth daily. 90 tablet 3   nitroGLYCERIN (NITROSTAT) 0.4 MG SL tablet Place 1 tablet (0.4 mg total) under the tongue every 5 (five) minutes as needed for chest pain. 30 tablet 2   No current facility-administered medications on file prior to visit.     Review of Systems  Constitutional:  Positive for appetite change, fatigue and unexpected weight change. Negative for activity change and fever.  HENT:  Negative for congestion, rhinorrhea, sore throat and trouble swallowing.   Eyes:  Negative for pain, redness, itching and visual disturbance.  Respiratory:  Negative for cough, chest tightness, shortness of breath and wheezing.   Cardiovascular:  Negative for chest pain and palpitations.  Gastrointestinal:  Positive for constipation. Negative for abdominal pain, blood in stool, diarrhea and nausea.  Endocrine: Negative for cold intolerance, heat intolerance, polydipsia and polyuria.  Genitourinary:  Positive for frequency and urgency. Negative for difficulty urinating, dysuria and hematuria.  Musculoskeletal:  Positive for arthralgias and back pain. Negative for joint swelling and myalgias.  Skin:  Negative for pallor and rash.  Neurological:  Negative for dizziness, tremors, weakness, numbness and headaches.  Hematological:  Negative for adenopathy. Does not bruise/bleed easily.  Psychiatric/Behavioral:  Negative for decreased concentration and dysphoric mood. The patient is not nervous/anxious.        Objective:   Physical Exam Constitutional:      General: He is not in acute distress.    Appearance: Normal appearance. He is well-developed  and normal weight. He is not ill-appearing or diaphoretic.  HENT:     Head: Normocephalic and atraumatic.     Right Ear: Tympanic membrane, ear canal and external ear normal.     Left Ear: Tympanic membrane, ear canal and external ear normal.     Nose: Nose normal. No congestion.     Mouth/Throat:     Mouth: Mucous membranes are moist.     Pharynx: Oropharynx is clear. No posterior oropharyngeal erythema.  Eyes:     General: No scleral icterus.       Right eye: No discharge.        Left eye: No discharge.     Conjunctiva/sclera: Conjunctivae normal.     Pupils: Pupils are equal, round, and reactive to light.  Neck:     Thyroid: No thyromegaly.     Vascular: No carotid bruit or JVD.  Cardiovascular:     Rate and Rhythm: Normal rate and regular rhythm.     Pulses: Normal pulses.     Heart sounds: Normal heart sounds.     No gallop.  Pulmonary:     Effort: Pulmonary effort is normal. No respiratory distress.     Breath sounds: Normal breath sounds. No wheezing or rales.     Comments: Good air exch Chest:     Chest wall: No tenderness.  Abdominal:     General: Abdomen is protuberant. Bowel sounds are normal. There is no distension or abdominal bruit.     Palpations: Abdomen is soft. There is no hepatomegaly, splenomegaly, mass or pulsatile mass.     Tenderness: There is no abdominal tenderness. There is no right CVA tenderness, left CVA tenderness, guarding or rebound. Negative signs include Murphy's sign and McBurney's sign.     Hernia: No  hernia is present.  Musculoskeletal:        General: No tenderness.     Cervical back: Normal range of motion and neck supple. No rigidity. No muscular tenderness.     Right lower leg: No edema.     Left lower leg: No edema.     Comments: Nl rom of TS and LS No spinal tenderness Nl gait  Neg SLR Nl rom of hips No gait   Lymphadenopathy:     Cervical: No cervical adenopathy.  Skin:    General: Skin is warm and dry.     Coloration: Skin  is not jaundiced or pale.     Findings: No erythema or rash.  Neurological:     Mental Status: He is alert.     Cranial Nerves: No cranial nerve deficit.     Motor: No abnormal muscle tone.     Coordination: Coordination normal.     Gait: Gait normal.     Deep Tendon Reflexes: Reflexes are normal and symmetric. Reflexes normal.  Psychiatric:        Mood and Affect: Mood normal.        Cognition and Memory: Cognition normal.           Assessment & Plan:   Problem List Items Addressed This Visit       Genitourinary   Microscopic hematuria - Primary    Some rbc on ua today  Culture ordered  Pt does c/o some R mid back and flank pain (acute on chronic)  KUB ordered to look for a stone  Urine culture ordered Consider CT if not improving (also in setting of abd bloating and constipation)      Relevant Orders   Urine Culture   POCT UA - Microscopic Only (Completed)     Other   Constipation    3-4 months  Has about 1 bm per week No help with miralax alone-has to take stimulant laxative Not enough water and fruit/veggie (not cooking at home much) Some bloating  Some mid to low back pain  Some rbc in urine today  Also some wt loss   KUB ordered  inst to continue miralax 2-3 times daily  Add magnesium (as tolerated) 250-500 mg More produce /discussed this in detail (but less bananas) Inc fluids to 64 oz daily  Prunes and prune juice   Pending rad review  Last colonoscopy- b9 polyps in 2014  Handout given       Relevant Orders   DG Abd 1 View   Flank pain    R flank and mid to lower back pain  Some rbc in urine   KUB ordered  LS film ordered Pending result  Low threshold for CT scan to look for stone or other  Also some new constipation and wt loss  Enc good hydration  ER parameters reviewed       Relevant Orders   DG Abd 1 View   Low back pain    Low to mid Worse on R Nl rom on exam  Some abd bloating and rbc in ua   Pending urine cx  KUB  and LS film ordered  inst to call if this worsens  ER parameters discussed       Relevant Orders   POCT Urinalysis Dipstick (Automated) (Completed)   DG Lumbar Spine Complete   Urgency of urination    ua with rbc H/o BPH  Urine culture pending       Relevant Orders  Urine Culture   POCT UA - Microscopic Only (Completed)

## 2022-07-07 NOTE — Patient Instructions (Addendum)
Try to eat more veggies and fruits when possible  Salads / fresh raw veggies  Fruit- like apples are great  Avoid bananas - they can constipate   Eat prunes and drink prune juice   Try to get 64 oz of fluids daily, mostly water   Get magnesium over the counter (in [facility] [place]) Take 250-500 mg of magnesium once daily for constipation   Continue the miralax 2-3 times daily   I want to order  Xray of your abdomen /kidneys  Xray of your spine  Urine culture

## 2022-07-07 NOTE — Assessment & Plan Note (Signed)
Some rbc on ua today  Culture ordered  Pt does c/o some R mid back and flank pain (acute on chronic)  KUB ordered to look for a stone  Urine culture ordered Consider CT if not improving (also in setting of abd bloating and constipation)

## 2022-07-07 NOTE — Assessment & Plan Note (Addendum)
3-4 months  Has about 1 bm per week No help with miralax alone-has to take stimulant laxative Not enough water and fruit/veggie (not cooking at home much) Some bloating  Some mid to low back pain  Some rbc in urine today  Also some wt loss   KUB ordered  inst to continue miralax 2-3 times daily  Add magnesium (as tolerated) 250-500 mg More produce /discussed this in detail (but less bananas) Inc fluids to 64 oz daily  Prunes and prune juice   Pending rad review  Last colonoscopy- b9 polyps in 2014  Handout given

## 2022-07-08 LAB — URINE CULTURE
MICRO NUMBER:: 13963275
Result:: NO GROWTH
SPECIMEN QUALITY:: ADEQUATE

## 2022-07-09 ENCOUNTER — Telehealth: Payer: Self-pay

## 2022-07-09 ENCOUNTER — Ambulatory Visit
Admission: RE | Admit: 2022-07-09 | Discharge: 2022-07-09 | Disposition: A | Discharge status: Home / Self Care | Payer: Medicare Other | Source: Ambulatory Visit | Attending: Family Medicine | Admitting: Family Medicine

## 2022-07-09 DIAGNOSIS — R319 Hematuria, unspecified: Secondary | ICD-10-CM | POA: Diagnosis not present

## 2022-07-09 DIAGNOSIS — R1011 Right upper quadrant pain: Secondary | ICD-10-CM

## 2022-07-09 DIAGNOSIS — R3129 Other microscopic hematuria: Secondary | ICD-10-CM

## 2022-07-09 DIAGNOSIS — R109 Unspecified abdominal pain: Secondary | ICD-10-CM

## 2022-07-09 NOTE — Addendum Note (Signed)
Addended by: Loura Pardon A on: 07/09/2022 10:52 AM   Modules accepted: Orders

## 2022-07-09 NOTE — Telephone Encounter (Signed)
Opal Sidles with [facility] called report on abd xray. Report is in epic and appears Dr Glori Bickers has already made notation. Copy taken to DR Tower's area and sending note to Dr Glori Bickers and Blades CMA.

## 2022-07-09 NOTE — Telephone Encounter (Signed)
Addressed through result notes  

## 2022-07-11 ENCOUNTER — Telehealth: Payer: Self-pay | Admitting: Family Medicine

## 2022-07-11 NOTE — Telephone Encounter (Signed)
Addressed through phone note 

## 2022-07-11 NOTE — Telephone Encounter (Signed)
Patient called and would like results from his CT scan. Call back number [phone].

## 2022-07-16 ENCOUNTER — Encounter: Payer: Self-pay | Admitting: Dermatology

## 2022-07-16 ENCOUNTER — Ambulatory Visit (INDEPENDENT_AMBULATORY_CARE_PROVIDER_SITE_OTHER): Payer: Medicare Other | Admitting: Dermatology

## 2022-07-16 ENCOUNTER — Telehealth: Payer: Self-pay

## 2022-07-16 DIAGNOSIS — L578 Other skin changes due to chronic exposure to nonionizing radiation: Secondary | ICD-10-CM

## 2022-07-16 DIAGNOSIS — I2510 Atherosclerotic heart disease of native coronary artery without angina pectoris: Secondary | ICD-10-CM | POA: Diagnosis not present

## 2022-07-16 DIAGNOSIS — C44329 Squamous cell carcinoma of skin of other parts of face: Secondary | ICD-10-CM | POA: Diagnosis not present

## 2022-07-16 DIAGNOSIS — Z85828 Personal history of other malignant neoplasm of skin: Secondary | ICD-10-CM

## 2022-07-16 DIAGNOSIS — C4492 Squamous cell carcinoma of skin, unspecified: Secondary | ICD-10-CM

## 2022-07-16 NOTE — Patient Instructions (Addendum)
[facility] will call you to schedule an appointment. If you have not heard from them within 1 week call our office.     Due to recent changes in healthcare laws, you may see results of your pathology and/or laboratory studies on MyChart before the doctors have had a chance to review them. We understand that in some cases there may be results that are confusing or concerning to you. Please understand that not all results are received at the same time and often the doctors may need to interpret multiple results in order to provide you with the best plan of care or course of treatment. Therefore, we ask that you please give [place] 2 business days to thoroughly review all your results before contacting the office for clarification. Should we see a critical lab result, you will be contacted sooner.   If You Need Anything After Your Visit  If you have any questions or concerns for your doctor, please call our main line at [phone] and press option 4 to reach your doctor's medical assistant. If no one answers, please leave a voicemail as directed and we will return your call as soon as possible. Messages left after 4 pm will be answered the following business day.   You may also send [place] a message via [place]. We typically respond to MyChart messages within 1-2 business days.  For prescription refills, please ask your pharmacy to contact our office. Our fax number is [phone].  If you have an urgent issue when the clinic is closed that cannot wait until the next business day, you can page your doctor at the number below.    Please note that while we do our best to be available for urgent issues outside of office hours, we are not available 24/7.   If you have an urgent issue and are unable to reach [place], you may choose to seek medical care at your doctor's office, retail clinic, urgent care center, or emergency room.  If you have a medical emergency, please immediately call 911 or go to the  emergency department.  Pager Numbers  - Dr. Nehemiah Massed: [phone]  - Dr. Laurence Ferrari: [phone]  - Dr. Nicole Kindred: [phone]  In the event of inclement weather, please call our main line at [phone] for an update on the status of any delays or closures.  Dermatology Medication Tips: Please keep the boxes that topical medications come in in order to help keep track of the instructions about where and how to use these. Pharmacies typically print the medication instructions only on the boxes and not directly on the medication tubes.   If your medication is too expensive, please contact our office at [phone] option 4 or send [place] a message through [place].   We are unable to tell what your co-pay for medications will be in advance as this is different depending on your insurance coverage. However, we may be able to find a substitute medication at lower cost or fill out paperwork to get insurance to cover a needed medication.   If a prior authorization is required to get your medication covered by your insurance company, please allow [place] 1-2 business days to complete this process.  Drug prices often vary depending on where the prescription is filled and some pharmacies may offer cheaper prices.  The website www.goodrx.com contains coupons for medications through different pharmacies. The prices here do not account for what the cost may be with help from insurance (it may be cheaper with your insurance),  but the website can give you the price if you did not use any insurance.  - You can print the associated coupon and take it with your prescription to the pharmacy.  - You may also stop by our office during regular business hours and pick up a GoodRx coupon card.  - If you need your prescription sent electronically to a different pharmacy, notify our office through [facility] or by phone at [phone] option 4.     Si Usted Necesita Algo Despus de Su Visita  Tambin puede  enviarnos un mensaje a travs de Pharmacist, community. Por lo general respondemos a los mensajes de MyChart en el transcurso de 1 a 2 das hbiles.  Para renovar recetas, por favor pida a su farmacia que se ponga en contacto con nuestra oficina. Harland Dingwall de fax es [place] [phone].  Si tiene un asunto urgente cuando la clnica est cerrada y que no puede esperar hasta el siguiente da hbil, puede llamar/localizar a su doctor(a) al nmero que aparece a continuacin.   Por favor, tenga en cuenta que aunque hacemos todo lo posible para estar disponibles para asuntos urgentes fuera del horario de [place], no estamos disponibles las 24 horas del da, los 7 das de la [place].   Si tiene un problema urgente y no puede comunicarse con nosotros, puede optar por buscar atencin mdica  en el consultorio de su doctor(a), en una clnica privada, en un centro de atencin urgente o en una sala de emergencias.  Si tiene Engineering geologist, por favor llame inmediatamente al 911 o vaya a la sala de emergencias.  Nmeros de bper  - Dr. Nehemiah Massed: [phone]  - Dra. Moye: [phone]  - Dra. Nicole Kindred: [phone]  En caso de inclemencias del [place], por favor llame a Johnsie Kindred principal al [phone] para una actualizacin sobre el [place] de cualquier retraso o cierre.  Consejos para la medicacin en dermatologa: Por favor, guarde las cajas en las que vienen los medicamentos de uso tpico para ayudarle a seguir las instrucciones sobre dnde y cmo usarlos. Las farmacias generalmente imprimen las instrucciones del medicamento slo en las cajas y no directamente en los tubos del [place].   Si su medicamento es muy caro, por favor, pngase en contacto con Zigmund Daniel llamando al [phone] y presione la opcin 4 o envenos un mensaje a travs de Pharmacist, community.   No podemos decirle cul ser su copago por los medicamentos por adelantado ya que esto es diferente dependiendo de la cobertura de su seguro.  Sin embargo, es posible que podamos encontrar un medicamento sustituto a Electrical engineer un formulario para que el seguro cubra el medicamento que se considera necesario.   Si se requiere una autorizacin previa para que su compaa de seguros [place] su medicamento, por favor permtanos de 1 a 2 das hbiles para completar este proceso.  Los precios de los medicamentos varan con frecuencia dependiendo del Environmental consultant de dnde se surte la receta y alguna farmacias pueden ofrecer precios ms baratos.  El sitio web www.goodrx.com tiene cupones para medicamentos de Airline pilot. Los precios aqu no tienen en cuenta lo que podra costar con la ayuda del seguro (puede ser ms barato con su seguro), pero el sitio web puede darle el precio si no utiliz Research scientist (physical sciences).  - Puede imprimir el cupn correspondiente y llevarlo con su receta a la farmacia.  - Tambin puede pasar por nuestra oficina durante el horario de atencin regular y Charity fundraiser una tarjeta de cupones  de GoodRx.  - Si necesita que su receta se enve electrnicamente a una farmacia diferente, informe a nuestra oficina a travs de MyChart de [place] o por telfono llamando al [phone] y presione la opcin 4.

## 2022-07-16 NOTE — Telephone Encounter (Signed)
Referral sent for Mohs to [place]

## 2022-07-16 NOTE — Progress Notes (Signed)
   Follow-Up Visit   Subjective  Jamie Byrd is a 88 y.o. male who presents for the following: Skin Problem (Patient states a skin cancer he had removed is growing back. Growing rapidly and tender. 04/21/2022 R med cheek, Atypical Squamous Proliferation w/Cystic Change, exc 06/09/2022 ).  Path showed positive deep margin.  The patient has spots, moles and lesions to be evaluated, some may be new or changing and the patient has concerns that these could be cancer.   The following portions of the chart were reviewed this encounter and updated as appropriate:      Review of Systems: No other skin or systemic complaints except as noted in HPI or Assessment and Plan.   Objective  Well appearing patient in no apparent distress; mood and affect are within normal limits.  A focused examination was performed including face. Relevant physical exam findings are noted in the Assessment and Plan.  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 Has been seen at [facility].  Actinic Damage - chronic, secondary to cumulative UV radiation exposure/sun exposure over time - diffuse scaly erythematous macules with underlying dyspigmentation - Recommend daily broad spectrum sunscreen SPF 30+ to sun-exposed areas, reapply every 2 hours as needed.  - Recommend staying in the shade or wearing long sleeves, sun glasses (UVA+UVB protection) and wide brim hats (4-inch brim around the entire circumference of the hat). - Call for new or changing lesions.   History of Skin Cancer (multiple BCCs/SCCs- see history)  Clear. Observe for recurrence.  Call clinic for new or changing lesions.   Recommend  regular skin exams, daily broad-spectrum spf 30+ sunscreen use, and photoprotection.      Return in about 3 months (around 10/16/2022) for UBSE.  I, Emelia Salisbury, CMA, am acting as scribe for Brendolyn Patty, MD.  Documentation: I have reviewed the above documentation for accuracy and completeness, and I agree with the above.  Brendolyn Patty MD

## 2022-07-23 ENCOUNTER — Telehealth: Payer: Self-pay

## 2022-07-23 DIAGNOSIS — C44329 Squamous cell carcinoma of skin of other parts of face: Secondary | ICD-10-CM | POA: Diagnosis not present

## 2022-07-23 NOTE — Telephone Encounter (Signed)
Progress notes from [facility] in media. aw

## 2022-07-29 NOTE — Telephone Encounter (Signed)
Reviewed. VM

## 2022-08-03 ENCOUNTER — Telehealth: Payer: Self-pay | Admitting: Family Medicine

## 2022-08-03 DIAGNOSIS — E786 Lipoprotein deficiency: Secondary | ICD-10-CM

## 2022-08-03 DIAGNOSIS — E039 Hypothyroidism, unspecified: Secondary | ICD-10-CM

## 2022-08-03 DIAGNOSIS — M1A9XX0 Chronic gout, unspecified, without tophus (tophi): Secondary | ICD-10-CM

## 2022-08-03 DIAGNOSIS — I10 Essential (primary) hypertension: Secondary | ICD-10-CM

## 2022-08-03 DIAGNOSIS — R7303 Prediabetes: Secondary | ICD-10-CM

## 2022-08-03 DIAGNOSIS — D696 Thrombocytopenia, unspecified: Secondary | ICD-10-CM

## 2022-08-03 DIAGNOSIS — N400 Enlarged prostate without lower urinary tract symptoms: Secondary | ICD-10-CM

## 2022-08-03 NOTE — Telephone Encounter (Signed)
-----   Message from Ellamae Sia sent at 07/24/2022 10:52 AM EDT ----- Regarding: Lab orders for Monday, 10.23.23 Patient is scheduled for CPX labs, please order future labs, Thanks , Karna Christmas

## 2022-08-05 ENCOUNTER — Telehealth: Payer: Self-pay

## 2022-08-05 ENCOUNTER — Other Ambulatory Visit (INDEPENDENT_AMBULATORY_CARE_PROVIDER_SITE_OTHER): Payer: Medicare Other

## 2022-08-05 ENCOUNTER — Ambulatory Visit: Payer: Medicare Other

## 2022-08-05 DIAGNOSIS — E039 Hypothyroidism, unspecified: Secondary | ICD-10-CM | POA: Diagnosis not present

## 2022-08-05 DIAGNOSIS — E786 Lipoprotein deficiency: Secondary | ICD-10-CM

## 2022-08-05 DIAGNOSIS — D696 Thrombocytopenia, unspecified: Secondary | ICD-10-CM | POA: Diagnosis not present

## 2022-08-05 DIAGNOSIS — M1A9XX0 Chronic gout, unspecified, without tophus (tophi): Secondary | ICD-10-CM | POA: Diagnosis not present

## 2022-08-05 DIAGNOSIS — I10 Essential (primary) hypertension: Secondary | ICD-10-CM | POA: Diagnosis not present

## 2022-08-05 DIAGNOSIS — R7303 Prediabetes: Secondary | ICD-10-CM | POA: Diagnosis not present

## 2022-08-05 LAB — COMPREHENSIVE METABOLIC PANEL
ALT: 10 U/L (ref 0–53)
AST: 21 U/L (ref 0–37)
Albumin: 4 g/dL (ref 3.5–5.2)
Alkaline Phosphatase: 96 U/L (ref 39–117)
BUN: 10 mg/dL (ref 6–23)
CO2: 31 mEq/L (ref 19–32)
Calcium: 9.4 mg/dL (ref 8.4–10.5)
Chloride: 101 mEq/L (ref 96–112)
Creatinine, Ser: 0.88 mg/dL (ref 0.40–1.50)
GFR: 78.2 mL/min (ref >60.00)
Glucose, Bld: 98 mg/dL (ref 70–99)
Potassium: 4.4 mEq/L (ref 3.5–5.1)
Sodium: 135 mEq/L (ref 135–145)
Total Bilirubin: 1 mg/dL (ref 0.2–1.2)
Total Protein: 6.3 g/dL (ref 6.0–8.3)

## 2022-08-05 LAB — CBC WITH DIFFERENTIAL/PLATELET
Basophils Absolute: 0 10*3/uL (ref 0.0–0.1)
Basophils Relative: 1 % (ref 0.0–3.0)
Eosinophils Absolute: 0.2 10*3/uL (ref 0.0–0.7)
Eosinophils Relative: 4.5 % (ref 0.0–5.0)
HCT: 36.3 % — ABNORMAL LOW (ref 39.0–52.0)
Hemoglobin: 12 g/dL — ABNORMAL LOW (ref 13.0–17.0)
Lymphocytes Relative: 46.6 % — ABNORMAL HIGH (ref 12.0–46.0)
Lymphs Abs: 2.1 10*3/uL (ref 0.7–4.0)
MCHC: 33.2 g/dL (ref 30.0–36.0)
MCV: 100.8 fl — ABNORMAL HIGH (ref 78.0–100.0)
Monocytes Absolute: 0.3 10*3/uL (ref 0.1–1.0)
Monocytes Relative: 6.7 % (ref 3.0–12.0)
Neutro Abs: 1.9 10*3/uL (ref 1.4–7.7)
Neutrophils Relative %: 41.2 % — ABNORMAL LOW (ref 43.0–77.0)
Platelets: 119 10*3/uL — ABNORMAL LOW (ref 150.0–400.0)
RBC: 3.6 Mil/uL — ABNORMAL LOW (ref 4.22–5.81)
RDW: 15.5 % (ref 11.5–15.5)
WBC: 4.6 10*3/uL (ref 4.0–10.5)

## 2022-08-05 LAB — LIPID PANEL
Cholesterol: 77 mg/dL (ref 0–200)
HDL: 30.1 mg/dL — ABNORMAL LOW (ref >39.00)
LDL Cholesterol: 28 mg/dL (ref 0–99)
NonHDL: 46.73
Total CHOL/HDL Ratio: 3
Triglycerides: 92 mg/dL (ref 0.0–149.0)
VLDL: 18.4 mg/dL (ref 0.0–40.0)

## 2022-08-05 LAB — HEMOGLOBIN A1C: Hgb A1c MFr Bld: 5.4 % (ref 4.6–6.5)

## 2022-08-05 LAB — URIC ACID: Uric Acid, Serum: 4 mg/dL (ref 4.0–7.8)

## 2022-08-05 LAB — TSH: TSH: 3.72 u[IU]/mL (ref 0.35–5.50)

## 2022-08-05 NOTE — Telephone Encounter (Signed)
Aware, thanks!

## 2022-08-05 NOTE — Telephone Encounter (Signed)
Pt came in for scheduled lab appt and on the way out pt asked for flu shot. I took pt to a room and asked the questions prior to flushot such as have you had a severe allergic reaction to a flu shot before and other questions on VIS sheet for flu shot.I then reviewed risks of a vaccine reaction on VIS sheet and advised pt that these symptoms are not likely but just wanted him to be aware of possible reaction to flu vaccine. Pt said that no one had ever told him this; pt usually just tells someone he wants a flu shot and they give him the shot and he leaves. I apologized that this was how I was trained but I was not trying to persuade pt not to get the flu shot but I wanted him to know possible reactions to the vaccine. Pt said he appreciated my honesty and time but he did not believe he would get the flu shot. Pt did not get flu shot last year. I advised pt if he changes his mind he can get flu shot. Pt voiced understanding and thanked me again and left office.Sending FYI to Dr Glori Bickers.

## 2022-08-05 NOTE — Telephone Encounter (Signed)
History and specimen tracking updated per MOHs progress note. 

## 2022-08-06 ENCOUNTER — Other Ambulatory Visit (INDEPENDENT_AMBULATORY_CARE_PROVIDER_SITE_OTHER): Payer: Medicare Other

## 2022-08-06 DIAGNOSIS — D509 Iron deficiency anemia, unspecified: Secondary | ICD-10-CM

## 2022-08-06 LAB — IBC + FERRITIN
Ferritin: 398.3 ng/mL — ABNORMAL HIGH (ref 22.0–322.0)
Iron: 154 ug/dL (ref 42–165)
Saturation Ratios: 57.9 % — ABNORMAL HIGH (ref 20.0–50.0)
TIBC: 266 ug/dL (ref 250.0–450.0)
Transferrin: 190 mg/dL — ABNORMAL LOW (ref 212.0–360.0)

## 2022-08-08 ENCOUNTER — Telehealth: Payer: Self-pay | Admitting: Family Medicine

## 2022-08-08 DIAGNOSIS — N400 Enlarged prostate without lower urinary tract symptoms: Secondary | ICD-10-CM

## 2022-08-08 DIAGNOSIS — N3289 Other specified disorders of bladder: Secondary | ICD-10-CM

## 2022-08-08 NOTE — Telephone Encounter (Signed)
Urology ref

## 2022-08-12 ENCOUNTER — Telehealth: Payer: Self-pay | Admitting: *Deleted

## 2022-08-12 NOTE — Telephone Encounter (Signed)
Left message with family requesting pt to call the office back

## 2022-08-12 NOTE — Telephone Encounter (Signed)
-----   Message from Abner Greenspan, MD sent at 08/08/2022  9:02 AM EDT ----- Iron stores and saturation are mildly high despite anemia Has he had any signs of infection lately?  (Uti or uri or skin infection)  If not I would like him to see a hematologist to evaluate further

## 2022-08-19 ENCOUNTER — Ambulatory Visit: Payer: Medicare Other | Admitting: Dermatology

## 2022-08-20 ENCOUNTER — Ambulatory Visit (INDEPENDENT_AMBULATORY_CARE_PROVIDER_SITE_OTHER): Payer: Medicare Other | Admitting: Urology

## 2022-08-20 ENCOUNTER — Encounter: Payer: Self-pay | Admitting: Urology

## 2022-08-20 VITALS — BP 136/68 | HR 66 | Ht 71.0 in | Wt 196.0 lb

## 2022-08-20 DIAGNOSIS — R972 Elevated prostate specific antigen [PSA]: Secondary | ICD-10-CM | POA: Diagnosis not present

## 2022-08-20 DIAGNOSIS — N401 Enlarged prostate with lower urinary tract symptoms: Secondary | ICD-10-CM

## 2022-08-20 DIAGNOSIS — N3289 Other specified disorders of bladder: Secondary | ICD-10-CM | POA: Diagnosis not present

## 2022-08-20 DIAGNOSIS — R3129 Other microscopic hematuria: Secondary | ICD-10-CM

## 2022-08-20 LAB — URINALYSIS, COMPLETE
Bilirubin, UA: NEGATIVE
Glucose, UA: NEGATIVE
Ketones, UA: NEGATIVE
Nitrite, UA: NEGATIVE
Protein,UA: NEGATIVE
Specific Gravity, UA: 1.02 (ref 1.005–1.030)
Urobilinogen, Ur: 1 mg/dL (ref 0.2–1.0)
pH, UA: 6 (ref 5.0–7.5)

## 2022-08-20 LAB — MICROSCOPIC EXAMINATION

## 2022-08-20 NOTE — Progress Notes (Signed)
08/20/2022 8:51 PM   Jamie Byrd 02-Jul-1934 [id number]  Referring provider: Abner Greenspan, MD [address] [place],  [place] [address]  Chief Complaint  Patient presents with   Benign Prostatic Hypertrophy    HPI: Jamie Byrd is a 88 y.o. male referred for evaluation of BPH and bladder wall thickening seen on CT.  He presents today with his sister.  Notes urinary hesitancy on occasions and mild urgency.  States voiding symptoms are not bothersome A noncontrast CT of the abdomen pelvis was ordered for evaluation of microhematuria and right flank pain.  He was noted to have bladder wall thickening and mild prostatic enlargement Notes occasional pain in his right low back region which radiates to his costal margin Denies dysuria or gross hematuria   PMH: Past Medical History:  Diagnosis Date   BCC (basal cell carcinoma) 09/12/2021   right neck posterior EDC 11/21/2021   BCC (basal cell carcinoma) 09/12/2021   right temple, EDC 02/18/2022   BCC (basal cell carcinoma) 09/12/2021   left temple, Mohs 12/18/21   BCC (basal cell carcinoma) 09/12/2021   left zygoma, Clear with biopsy 02/18/2022   BCC (basal cell carcinoma) 10/24/2021   right nasolabial fold, MOHs 01/01/2022   BPH (benign prostatic hypertrophy)    Difficulty reading    Difficulty seeing    Difficulty in seeing certain things and reading/ not legally blind!   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   right neck anterior, well differentiated, base involved EDC 11/21/2021   Squamous cell carcinoma of skin 11/21/2021   right dorsal hand, EDC   Squamous cell carcinoma of skin 04/21/2022   R med cheek, Atypical Squamous Proliferation w/Cystic Change, exc 06/09/2022    Surgical History: Past Surgical History:  Procedure Laterality Date   COLONOSCOPY     CORONARY ATHERECTOMY N/A 09/26/2021   Procedure: CORONARY  ATHERECTOMY;  Surgeon: Nigel Mormon, MD;  Location: [facility];  Service: Cardiovascular;  Laterality: N/A;   CORONARY STENT INTERVENTION N/A 09/26/2021   Procedure: CORONARY STENT INTERVENTION;  Surgeon: Nigel Mormon, MD;  Location: [facility];  Service: Cardiovascular;  Laterality: N/A;   INTRAVASCULAR IMAGING/OCT N/A 09/26/2021   Procedure: INTRAVASCULAR IMAGING/OCT;  Surgeon: Nigel Mormon, MD;  Location: [facility];  Service: Cardiovascular;  Laterality: N/A;   LEFT HEART CATH AND CORONARY ANGIOGRAPHY N/A 09/25/2021   Procedure: LEFT HEART CATH AND CORONARY ANGIOGRAPHY;  Surgeon: Nigel Mormon, MD;  Location: [facility];  Service: Cardiovascular;  Laterality: N/A;   POLYPECTOMY      Home Medications:  Allergies as of 08/20/2022   No Known Allergies      Medication List        Accurate as of August 20, 2022  8:51 PM. If you have any questions, ask your nurse or doctor.          allopurinol 100 MG tablet Commonly known as: ZYLOPRIM TAKE 1 TABLET BY MOUTH ONCE A DAY   aspirin EC 81 MG tablet Take 1 tablet (81 mg total) by mouth daily. Swallow whole.   clopidogrel 75 MG tablet Commonly known as: PLAVIX TAKE 1 TABLET BY MOUTH ONCE A DAY   levothyroxine 88 MCG tablet Commonly known as: SYNTHROID Take 1 tablet (88 mcg total) by mouth daily before breakfast.  lisinopril 10 MG tablet Commonly known as: ZESTRIL Take 10 mg by mouth daily.   metoprolol succinate 25 MG 24 hr tablet Commonly known as: TOPROL-XL Take 0.5 tablets (12.5 mg total) by mouth daily. Take with or immediately following a meal.   nitroGLYCERIN 0.4 MG SL tablet Commonly known as: NITROSTAT Place 1 tablet (0.4 mg total) under the tongue every 5 (five) minutes as needed for chest pain.   rosuvastatin 20 MG tablet Commonly known as: CRESTOR Take 1 tablet (20 mg total) by mouth daily.        Allergies: No Known Allergies  Family  History: Family History  Problem Relation Age of Onset   Heart disease Mother        CABG   Heart disease Father        CAD   Cancer Father        lung CA   Cancer Brother        lung CA    Social History:  reports that he quit smoking about 53 years ago. His smoking use included cigarettes. He has a 22.50 pack-year smoking history. He has never used smokeless tobacco. He reports that he does not drink alcohol and does not use drugs.   Physical Exam: BP 136/68   Pulse 66   Ht '5\' 11"'$  (1.803 m)   Wt 196 lb (88.9 kg)   BMI 27.34 kg/m   Constitutional:  Alert and oriented, No acute distress. HEENT: [place] AT Respiratory: Normal respiratory effort, no increased work of breathing. Neurologic: Grossly intact, no focal deficits, moving all 4 extremities. Psychiatric: Normal mood and affect.  Laboratory Data:  Urinalysis Dipstick trace blood/trace leukocytes Microscopy 6-10 WBC 3-10 RBC  Pertinent Imaging: CT images were personally reviewed and interpreted   Assessment & Plan:    1.  BPH with LUTS Mild LUTS which are not bothersome.  Bladder wall thickening on CT secondary to BPH  2.  Right back pain No findings on CT to suggest a GU origin.  Most likely musculoskeletal  3.  Microhematuria Prior UA Dr. Glori Bickers showed 2-5 RBC and UA today with 3-10 RBC The recommended imaging for microhematuria in this age group is a CT urogram and a noncontrast CT was performed We discussed cystoscopy is recommended for lower tract evaluation for microhematuria.  The procedure was discussed and scheduled If cystoscopy unremarkable and he has persistent microhematuria will schedule CT urogram   Abbie Sons, MD  [facility] [address] [place], [place] [address] [phone]

## 2022-08-23 LAB — CULTURE, URINE COMPREHENSIVE

## 2022-08-25 ENCOUNTER — Telehealth: Payer: Self-pay | Admitting: Family Medicine

## 2022-08-25 DIAGNOSIS — D649 Anemia, unspecified: Secondary | ICD-10-CM | POA: Insufficient documentation

## 2022-08-25 DIAGNOSIS — R7989 Other specified abnormal findings of blood chemistry: Secondary | ICD-10-CM

## 2022-08-25 NOTE — Telephone Encounter (Signed)
Patient called in and stated he will like for the referral to be sent in to the blood specialist. Thank you!

## 2022-08-25 NOTE — Telephone Encounter (Signed)
You are mildly anemic Your ferritin level is elevated - this can happen if your body stores too much iron (if you take any supplements with iron stop them for now)  This can also happen from inflammation   The urologic condition being investigated may cause both anemia (from microscopic blood in your urine) and also elevated ferritin if your bladder wall is inflamed (like the urologist suspects)   I am fine re checking this again after the urology tests and then deciding from there whether you need to see a hematologist  Please schedule a follow up appt with me 2 weeks after your cystoscopy and we will talk and do labs that day

## 2022-08-26 NOTE — Telephone Encounter (Signed)
Pt has been informed and has verbalized understanding. 

## 2022-09-08 ENCOUNTER — Ambulatory Visit: Payer: Medicare Other | Admitting: Student

## 2022-09-08 ENCOUNTER — Ambulatory Visit: Payer: Medicare Other

## 2022-09-08 VITALS — BP 125/68 | HR 69 | Temp 97.8°F | Resp 16 | Ht 71.0 in | Wt 199.8 lb

## 2022-09-08 DIAGNOSIS — I25118 Atherosclerotic heart disease of native coronary artery with other forms of angina pectoris: Secondary | ICD-10-CM | POA: Diagnosis not present

## 2022-09-08 DIAGNOSIS — I10 Essential (primary) hypertension: Secondary | ICD-10-CM | POA: Diagnosis not present

## 2022-09-08 DIAGNOSIS — I77810 Thoracic aortic ectasia: Secondary | ICD-10-CM

## 2022-09-08 NOTE — Progress Notes (Signed)
Primary Physician/Referring:  Tower, Wynelle Fanny, MD  Patient ID: DELYNN Byrd, male    DOB: Feb 24, 1934, 88 y.o.   MRN: [id number]  Chief Complaint  Patient presents with   Coronary Artery Disease   Hypertension   Follow-up    6 months   HPI:    Jamie Byrd  is a 88 y.o. Caucasian male with history of hypertension, hypothyroidism, prediabetes, former smoker.  Patient has history of CAD status post complex PCI 09/26/2021 to LAD.   She presents today for 86-monthfollow-up.  At last office visit he was stable from a cardiovascular standpoint, therefore no changes were made.  Today he is doing well without any complaints.  Continues to tolerate dual antiplatelet therapy without bleeding issues.  He remains active working in his large garden. He denies chest pain, shortness of breath, palpitations, leg edema, orthopnea, PND, syncope.  Past Medical History:  Diagnosis Date   BCC (basal cell carcinoma) 09/12/2021   right neck posterior EDC 11/21/2021   BCC (basal cell carcinoma) 09/12/2021   right temple, EDC 02/18/2022   BCC (basal cell carcinoma) 09/12/2021   left temple, Mohs 12/18/21   BCC (basal cell carcinoma) 09/12/2021   left zygoma, Clear with biopsy 02/18/2022   BCC (basal cell carcinoma) 10/24/2021   right nasolabial fold, MOHs 01/01/2022   BPH (benign prostatic hypertrophy)    Difficulty reading    Difficulty seeing    Difficulty in seeing certain things and reading/ not legally blind!   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   right neck anterior, well differentiated, base involved EDC 11/21/2021   Squamous cell carcinoma of skin 11/21/2021   right dorsal hand, EDC   Squamous cell carcinoma of skin 04/21/2022   R med cheek, Atypical Squamous Proliferation w/Cystic Change, exc 06/09/2022   Past Surgical History:  Procedure Laterality Date   COLONOSCOPY      CORONARY ATHERECTOMY N/A 09/26/2021   Procedure: CORONARY ATHERECTOMY;  Surgeon: PNigel Mormon MD;  [facility];  Service: Cardiovascular;  Laterality: N/A;   CORONARY STENT INTERVENTION N/A 09/26/2021   Procedure: CORONARY STENT INTERVENTION;  Surgeon: PNigel Mormon MD;  [facility];  Service: Cardiovascular;  Laterality: N/A;   INTRAVASCULAR IMAGING/OCT N/A 09/26/2021   Procedure: INTRAVASCULAR IMAGING/OCT;  Surgeon: PNigel Mormon MD;  [facility];  Service: Cardiovascular;  Laterality: N/A;   LEFT HEART CATH AND CORONARY ANGIOGRAPHY N/A 09/25/2021   Procedure: LEFT HEART CATH AND CORONARY ANGIOGRAPHY;  Surgeon: PNigel Mormon MD;  [facility];  Service: Cardiovascular;  Laterality: N/A;   POLYPECTOMY     Family History  Problem Relation Age of Onset   Heart disease Mother        CABG   Heart disease Father        CAD   Cancer Father        lung CA   Cancer Brother        lung CA    Social History   Tobacco Use   Smoking status: Former    Packs/day: 1.50    Years: 15.00    Total pack years: 22.50    Types: Cigarettes    Quit date: 10/13/1968    Years since quitting: 53.9   Smokeless tobacco: Never  Substance Use Topics   Alcohol use: No    Comment: no alcohol in 20 years   Marital Status: Married   ROS  Review of Systems  Cardiovascular:  Negative for chest pain, claudication, dyspnea on exertion, leg swelling, near-syncope, orthopnea, palpitations, paroxysmal nocturnal dyspnea and syncope.  Neurological:  Negative for dizziness.    Objective  Blood pressure 125/68, pulse 69, temperature 97.8 F (36.6 C), temperature source Temporal, resp. rate 16, height '5\' 11"'$  (1.803 m), weight 199 lb 12.8 oz (90.6 kg).     09/08/2022    9:19 AM 08/20/2022    9:47 AM 07/07/2022    2:18 PM  Vitals with BMI  Height '5\' 11"'$  '5\' 11"'$  '5\' 11"'$   Weight 199 lbs 13 oz 196 lbs 197 lbs 4 oz  BMI  27.88 74.08 14.48  Systolic 185 631 497  Diastolic 68 68 70  Pulse 69 66 68      Physical Exam Vitals reviewed.  Neck:     Vascular: No carotid bruit.  Cardiovascular:     Rate and Rhythm: Normal rate and regular rhythm.     Pulses: Intact distal pulses.     Heart sounds: S1 normal and S2 normal. No murmur heard.    No gallop.  Pulmonary:     Effort: Pulmonary effort is normal. No respiratory distress.     Breath sounds: No wheezing, rhonchi or rales.  Musculoskeletal:     Right lower leg: No edema.     Left lower leg: No edema.  Neurological:     Mental Status: He is alert.   Physical exam unchanged compared to previous office visit.   Laboratory examination:   Recent Labs    09/25/21 0334 09/26/21 0115 09/27/21 0326 05/26/22 1003 08/05/22 0842  NA 133* 138 134* 134* 135  K 3.9 4.6 4.0 4.5 4.4  CL 101 105 104 100 101  CO2 '25 27 24 27 31  '$ GLUCOSE 94 101* 103* 93 98  BUN '11 13 16 14 10  '$ CREATININE 0.98 1.08 1.04 0.98 0.88  CALCIUM 9.3 9.0 8.9 9.6 9.4  GFRNONAA >60 >60 >60  --   --    CrCl cannot be calculated (Patient's most recent lab result is older than the maximum 21 days allowed.).     Latest Ref Rng & Units 08/05/2022    8:42 AM 05/26/2022   10:03 AM 09/27/2021    3:26 AM  CMP  Glucose 70 - 99 mg/dL 98  93  103   BUN 6 - 23 mg/dL '10  14  16   '$ Creatinine 0.40 - 1.50 mg/dL 0.88  0.98  1.04   Sodium 135 - 145 mEq/L 135  134  134   Potassium 3.5 - 5.1 mEq/L 4.4  4.5  4.0   Chloride 96 - 112 mEq/L 101  100  104   CO2 19 - 32 mEq/L '31  27  24   '$ Calcium 8.4 - 10.5 mg/dL 9.4  9.6  8.9   Total Protein 6.0 - 8.3 g/dL 6.3  6.8    Total Bilirubin 0.2 - 1.2 mg/dL 1.0  1.0    Alkaline Phos 39 - 117 U/L 96  94    AST 0 - 37 U/L 21  23    ALT 0 - 53 U/L 10  10        Latest Ref Rng & Units 08/05/2022    8:42 AM 05/26/2022   10:03 AM 09/27/2021    3:26 AM  CBC  WBC 4.0 - 10.5 K/uL 4.6  4.7  6.3   Hemoglobin 13.0 - 17.0 g/dL 12.0  13.0  11.9   Hematocrit  39.0 - 52.0 % 36.3  39.1  35.4   Platelets 150.0 - 400.0 K/uL 119.0  126.0  133     Lipid Panel Recent Labs    09/25/21 0334 05/26/22 1003 08/05/22 0842  CHOL 117 70 77  TRIG 58 70.0 92.0  LDLCALC 72 27 28  VLDL 12 14.0 18.4  HDL 33* 28.20* 30.10*  CHOLHDL 3.'5 2 3  '$ HEMOGLOBIN A1C Lab Results  Component Value Date   HGBA1C 5.4 08/05/2022   TSH Recent Labs    05/26/22 1003 08/05/22 0842  TSH 3.51 3.72   External labs:  None  Allergies  Not on File   Medications Prior to Visit:   Outpatient Medications Prior to Visit  Medication Sig Dispense Refill   allopurinol (ZYLOPRIM) 100 MG tablet TAKE 1 TABLET BY MOUTH ONCE A DAY 90 tablet 3   aspirin 81 MG EC tablet Take 1 tablet (81 mg total) by mouth daily. Swallow whole. 30 tablet 2   clopidogrel (PLAVIX) 75 MG tablet TAKE 1 TABLET BY MOUTH ONCE A DAY 90 tablet 3   levothyroxine (SYNTHROID) 88 MCG tablet Take 1 tablet (88 mcg total) by mouth daily before breakfast. 90 tablet 3   lisinopril (ZESTRIL) 10 MG tablet Take 10 mg by mouth daily.     metoprolol succinate (TOPROL-XL) 25 MG 24 hr tablet Take 0.5 tablets (12.5 mg total) by mouth daily. Take with or immediately following a meal. 45 tablet 3   nitroGLYCERIN (NITROSTAT) 0.4 MG SL tablet Place 1 tablet (0.4 mg total) under the tongue every 5 (five) minutes as needed for chest pain. 30 tablet 2   rosuvastatin (CRESTOR) 20 MG tablet Take 1 tablet (20 mg total) by mouth daily. 90 tablet 3   No facility-administered medications prior to visit.   Final Medications at End of Visit    No outpatient medications have been marked as taking for the 09/08/22 encounter (Office Visit) with Ernst Spell, NP.   Radiology:   No results found.  Cardiac Studies:  PCV ECHOCARDIOGRAM COMPLETE 24/23/5361 Normal LV systolic function with visual EF 60-65%. Left ventricle cavity is normal in size. Normal left ventricular wall thickness. Normal global wall motion. Normal diastolic  filling pattern, normal LAP. Trace aortic regurgitation. The aortic root is mildly dilated (Sinus of Valsalva 3.96cm and  Sinotubular junction 3.8cm). Proximal ascending aorta upper limit of normal 3.7cm. Compared to study 09/25/2021 Aortic root was 77m and now aortic root measures (Sinus of Valsalva 3.96cm and  Sinotubular junction 3.8cm).   Coronary angiogram 09/25/2021: LM: Normal LAD: Ostial 99% stenosis with severe calcification Ramus: Mid 80% eccentric stenosis with severe calcification Lcx: Small OM1 with 50% disease        Large OM1 with mid 30% disease RCA: Prox 50% stenosis with severe calcification   LVEDP normal LVEF normal on echocardiogram   Given location of the ostial LAD lesion with severe calcification with a large ramus arising at an acute angle with respect to LAD.  Will consult CVTS for possible CABG to LAD and ramus Limiting factors could be age and aorta calcification. Also, patient is the primary caretaker to his 824y/o wife with dementia. He does not have living children, but has brothers and sisters in their 688s I spoke with his sister BGeralyn Corwin([phone]   If not a surgical candidate, could consider  high risk PCI ostial LAD, along with PCI to ramus  Coronary intervention 09/26/2021: Coronary intervention: 80% mid high diag stenosis.  99% ostial LAD stenosis Successful percutaneous coronary intervention mid high diag and ostial LAD        PTCA and overlapping stents placement high diag        3.0 X 22 mm Onyx Frontier drug-eluting stent        3.0 X 15 mm Onyx Frontier drug-eluting stent         Post dilatation with 3.25 mm [place] balloon        80%--->0% residual stenosis. TIMI flow II-->III        PTCA and overlapping stents placement ostial LAD        3.0 X 15 mm Onyx Frontier drug-eluting stent        Post dilatation with 3.5 mm Parker balloon        99%--->0% residual stenosis. TIMI flow II-->III  EKG:   EKG 09/08/2022: Sinus bradycardia with  first-degree AV block at rate of 59 bpm.  Normal axis.  Diffuse low voltage complexes, old anterior septal infarct.  Compared to previous EKG on 10/09/2022, no significant change.  Assessment     ICD-10-CM   1. Coronary artery disease of native artery of native heart with stable angina pectoris ([place])  I25.118 EKG 12-Lead    PCV ECHOCARDIOGRAM COMPLETE    2. Primary hypertension  I10     3. Dilated aortic root (HCC)  I77.810 PCV ECHOCARDIOGRAM COMPLETE        There are no discontinued medications.   No orders of the defined types were placed in this encounter.   Recommendations:   Jamie Byrd is a 88 y.o. Caucasian male with history of hypertension, hypothyroidism, prediabetes, former smoker.  Presented with unstable angina 09/25/2021 with high grade 99% ostial LAD lesion and another 80% lesion in ramus/high diag. He underwent successful atherectomy and OCT guided complex PCI (09/26/2021) after patient refused to consider CABG.  CAD: No recurrence of chest pain. EKG unchanged from previous, no evidence of ischemia. He continues on dual antiplatelet therapy with aspirin and Plavix for now, may stop Plavix 09/12/2022.  No evidence of bleeding diathesis. Continue rosuvastatin, metoprolol, lisinopril.  Hypertension: Blood pressure is well controlled.  He does not currently monitor his blood pressure at home. Continue current medications without changes. Discussed the importance of low-sodium diet less than 2000 mg daily and exercise. Remains active around his home and working in his large garden.  Mild aortic root dilation: Mild dilation of aortic root remains stable. Again discussed with patient transitioning to losartan from lisinopril, however lisinopril is less expensive and patient is already having difficulty continuing to pay for medications. We will repeat echocardiogram prior to next visit.  Follow up in 6 months, sooner if needed.    Ernst Spell,  AGNP-C 09/08/2022, 9:47 AM Office: [phone]

## 2022-09-19 ENCOUNTER — Encounter: Payer: Self-pay | Admitting: Urology

## 2022-09-19 ENCOUNTER — Ambulatory Visit (INDEPENDENT_AMBULATORY_CARE_PROVIDER_SITE_OTHER): Payer: Medicare Other | Admitting: Urology

## 2022-09-19 VITALS — BP 130/68 | HR 74 | Ht 71.0 in | Wt 199.0 lb

## 2022-09-19 DIAGNOSIS — R3129 Other microscopic hematuria: Secondary | ICD-10-CM | POA: Diagnosis not present

## 2022-09-19 DIAGNOSIS — N401 Enlarged prostate with lower urinary tract symptoms: Secondary | ICD-10-CM | POA: Diagnosis not present

## 2022-09-19 LAB — MICROSCOPIC EXAMINATION

## 2022-09-19 LAB — URINALYSIS, COMPLETE
Bilirubin, UA: NEGATIVE
Glucose, UA: NEGATIVE
Ketones, UA: NEGATIVE
Nitrite, UA: NEGATIVE
Protein,UA: NEGATIVE
Specific Gravity, UA: 1.015 (ref 1.005–1.030)
Urobilinogen, Ur: 1 mg/dL (ref 0.2–1.0)
pH, UA: 6.5 (ref 5.0–7.5)

## 2022-09-19 NOTE — Progress Notes (Unsigned)
   09/19/22  CC:  Chief Complaint  Patient presents with   Cysto    HPI: Refer to my prior note 08/20/2022.  No complaints today  Blood pressure 130/68, pulse 74, height '5\' 11"'$  (1.803 m), weight 199 lb (90.3 kg). NED. A&Ox3.   No respiratory distress   Abd soft, NT, ND Normal phallus with bilateral descended testicles  Cystoscopy Procedure Note  Patient identification was confirmed, informed consent was obtained, and patient was prepped using Betadine solution.  Lidocaine jelly was administered per urethral meatus.     Pre-Procedure: - Inspection reveals a normal caliber urethral meatus.  Procedure: The flexible cystoscope was introduced without difficulty - No urethral strictures/lesions are present. - Prominent lateral lobe enlargement with hypervascularity/friability prostate  - Moderate elevation bladder neck - Bilateral ureteral orifices identified - Bladder mucosa  reveals no ulcers, tumors, or lesions - No bladder stones - Heavily trabeculated bladder with cellules/diverticula  Retroflexion shows no intravesical median lobe or tumor   Post-Procedure: - Patient tolerated the procedure well  Assessment/ Plan: No bladder mucosal abnormalities Microhematuria most likely secondary to BPH Lab visit for follow-up UA 3 months.  CTU for continued microhematuria    Abbie Sons, MD

## 2022-10-03 ENCOUNTER — Encounter: Payer: Self-pay | Admitting: Family Medicine

## 2022-10-03 ENCOUNTER — Ambulatory Visit (INDEPENDENT_AMBULATORY_CARE_PROVIDER_SITE_OTHER): Payer: Medicare Other | Admitting: Family Medicine

## 2022-10-03 VITALS — BP 114/68 | HR 61 | Temp 97.6°F | Ht 71.0 in | Wt 198.5 lb

## 2022-10-03 DIAGNOSIS — N400 Enlarged prostate without lower urinary tract symptoms: Secondary | ICD-10-CM | POA: Diagnosis not present

## 2022-10-03 DIAGNOSIS — N3289 Other specified disorders of bladder: Secondary | ICD-10-CM | POA: Diagnosis not present

## 2022-10-03 DIAGNOSIS — I2510 Atherosclerotic heart disease of native coronary artery without angina pectoris: Secondary | ICD-10-CM | POA: Diagnosis not present

## 2022-10-03 DIAGNOSIS — D649 Anemia, unspecified: Secondary | ICD-10-CM | POA: Diagnosis not present

## 2022-10-03 DIAGNOSIS — I10 Essential (primary) hypertension: Secondary | ICD-10-CM

## 2022-10-03 DIAGNOSIS — R7989 Other specified abnormal findings of blood chemistry: Secondary | ICD-10-CM | POA: Diagnosis not present

## 2022-10-03 DIAGNOSIS — Z23 Encounter for immunization: Secondary | ICD-10-CM | POA: Diagnosis not present

## 2022-10-03 LAB — CBC WITH DIFFERENTIAL/PLATELET
Basophils Absolute: 0.1 10*3/uL (ref 0.0–0.1)
Basophils Relative: 1.3 % (ref 0.0–3.0)
Eosinophils Absolute: 0.3 10*3/uL (ref 0.0–0.7)
Eosinophils Relative: 5.5 % — ABNORMAL HIGH (ref 0.0–5.0)
HCT: 39.8 % (ref 39.0–52.0)
Hemoglobin: 13.3 g/dL (ref 13.0–17.0)
Lymphocytes Relative: 39.2 % (ref 12.0–46.0)
Lymphs Abs: 1.9 10*3/uL (ref 0.7–4.0)
MCHC: 33.5 g/dL (ref 30.0–36.0)
MCV: 100.8 fl — ABNORMAL HIGH (ref 78.0–100.0)
Monocytes Absolute: 0.3 10*3/uL (ref 0.1–1.0)
Monocytes Relative: 6.2 % (ref 3.0–12.0)
Neutro Abs: 2.3 10*3/uL (ref 1.4–7.7)
Neutrophils Relative %: 47.8 % (ref 43.0–77.0)
Platelets: 135 10*3/uL — ABNORMAL LOW (ref 150.0–400.0)
RBC: 3.95 Mil/uL — ABNORMAL LOW (ref 4.22–5.81)
RDW: 14.3 % (ref 11.5–15.5)
WBC: 4.8 10*3/uL (ref 4.0–10.5)

## 2022-10-03 LAB — BASIC METABOLIC PANEL
BUN: 13 mg/dL (ref 6–23)
CO2: 30 mEq/L (ref 19–32)
Calcium: 9.8 mg/dL (ref 8.4–10.5)
Chloride: 102 mEq/L (ref 96–112)
Creatinine, Ser: 0.94 mg/dL (ref 0.40–1.50)
GFR: 73.64 mL/min (ref >60.00)
Glucose, Bld: 100 mg/dL — ABNORMAL HIGH (ref 70–99)
Potassium: 5 mEq/L (ref 3.5–5.1)
Sodium: 136 mEq/L (ref 135–145)

## 2022-10-03 LAB — IRON: Iron: 106 ug/dL (ref 42–165)

## 2022-10-03 NOTE — Patient Instructions (Addendum)
A stool softener may be helpful (over the counter like dulcolax or colace)  Keep up with fluids   Eat fruits and vegetables also   Labs today for anemia  I may have you see a hematologist if labs don't look better    If you want to try something for prostate later let us know

## 2022-10-03 NOTE — Progress Notes (Unsigned)
Subjective:    Patient ID: Jamie Byrd, male    DOB: 1934/04/20, 88 y.o.   MRN: [id number]  HPI Pt presents for f/u of anemia in setting of blood in urine   Wt Readings from Last 3 Encounters:  10/03/22 198 lb 8 oz (90 kg)  09/19/22 199 lb (90.3 kg)  09/08/22 199 lb 12.8 oz (90.6 kg)   27.69 kg/m  Feeling good   Lab Results  Component Value Date   WBC 4.6 08/05/2022   HGB 12.0 (L) 08/05/2022   HCT 36.3 (L) 08/05/2022   MCV 100.8 (H) 08/05/2022   PLT 119.0 (L) 08/05/2022   Lab Results  Component Value Date   IRON 154 08/06/2022   TIBC 266.0 08/06/2022   FERRITIN 398.3 (H) 08/06/2022   Has h/o thrombocytopenia   Sees urology  Has enlarged prostate  Noted bladder  wall thickening on CT 2nd to BPH Cystoscopy for blood in urine  Result: Assessment/ Plan: No bladder mucosal abnormalities Microhematuria most likely secondary to BPH Lab visit for follow-up UA 3 months.  CTU for continued microhematuria  Is now no longer taking plavix  He has to urinate 3-4 times at night Deals with it  Is urgent at times   No abd pain   Still has infrequent stools/constipation  Miralax does not help    HTN bp is stable today  No cp or palpitations or headaches or edema  No side effects to medicines  BP Readings from Last 3 Encounters:  10/03/22 114/68  09/19/22 130/68  09/08/22 125/68     Patient Active Problem List   Diagnosis Date Noted   Anemia 08/25/2022   Elevated ferritin 08/25/2022   Enlarged prostate 08/08/2022   Bladder wall thickening 08/08/2022   Microscopic hematuria 07/07/2022   Urgency of urination 07/07/2022   Flank pain 07/07/2022   CAD (coronary artery disease) 05/26/2022   Constipation 05/26/2022   Thrombocytopenia ([place]) 05/26/2022   Low back pain 05/21/2018   Macular degeneration 12/18/2017   Hearing loss of aging 12/09/2013   Encounter for Medicare annual wellness exam 12/09/2013   Personal history of colonic polyps 12/08/2012    Prostate cancer screening 09/10/2011   Hyperkalemia 09/25/2010   Prediabetes 09/25/2010   Low HDL (under 40) 11/05/2009   Hypothyroidism 07/28/2007   Gout 07/28/2007   ERECTILE DYSFUNCTION 07/28/2007   Primary hypertension 07/28/2007   Benign prostatic hyperplasia 07/28/2007   Past Medical History:  Diagnosis Date   BCC (basal cell carcinoma) 09/12/2021   right neck posterior EDC 11/21/2021   BCC (basal cell carcinoma) 09/12/2021   right temple, EDC 02/18/2022   BCC (basal cell carcinoma) 09/12/2021   left temple, Mohs 12/18/21   BCC (basal cell carcinoma) 09/12/2021   left zygoma, Clear with biopsy 02/18/2022   BCC (basal cell carcinoma) 10/24/2021   right nasolabial fold, MOHs 01/01/2022   BPH (benign prostatic hypertrophy)    Difficulty reading    Difficulty seeing    Difficulty in seeing certain things and reading/ not legally blind!   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   right neck anterior, well differentiated, base involved EDC 11/21/2021   Squamous cell carcinoma of skin 11/21/2021   right dorsal hand, EDC   Squamous cell carcinoma of skin 04/21/2022   R med cheek, Atypical Squamous Proliferation w/Cystic Change, exc 06/09/2022  Past Surgical History:  Procedure Laterality Date   COLONOSCOPY     CORONARY ATHERECTOMY N/A 09/26/2021   Procedure: CORONARY ATHERECTOMY;  Surgeon: Nigel Mormon, MD;  Location: [facility];  Service: Cardiovascular;  Laterality: N/A;   CORONARY STENT INTERVENTION N/A 09/26/2021   Procedure: CORONARY STENT INTERVENTION;  Surgeon: Nigel Mormon, MD;  Location: [facility];  Service: Cardiovascular;  Laterality: N/A;   INTRAVASCULAR IMAGING/OCT N/A 09/26/2021   Procedure: INTRAVASCULAR IMAGING/OCT;  Surgeon: Nigel Mormon, MD;  Location: [facility];  Service: Cardiovascular;  Laterality: N/A;    LEFT HEART CATH AND CORONARY ANGIOGRAPHY N/A 09/25/2021   Procedure: LEFT HEART CATH AND CORONARY ANGIOGRAPHY;  Surgeon: Nigel Mormon, MD;  Location: [facility];  Service: Cardiovascular;  Laterality: N/A;   POLYPECTOMY     Social History   Tobacco Use   Smoking status: Former    Packs/day: 1.50    Years: 15.00    Total pack years: 22.50    Types: Cigarettes    Quit date: 10/13/1968    Years since quitting: 54.0   Smokeless tobacco: Never  Vaping Use   Vaping Use: Never used  Substance Use Topics   Alcohol use: No    Comment: no alcohol in 20 years   Drug use: No   Family History  Problem Relation Age of Onset   Heart disease Mother        CABG   Heart disease Father        CAD   Cancer Father        lung CA   Cancer Brother        lung CA   No Known Allergies Current Outpatient Medications on File Prior to Visit  Medication Sig Dispense Refill   allopurinol (ZYLOPRIM) 100 MG tablet TAKE 1 TABLET BY MOUTH ONCE A DAY 90 tablet 3   aspirin 81 MG EC tablet Take 1 tablet (81 mg total) by mouth daily. Swallow whole. 30 tablet 2   levothyroxine (SYNTHROID) 88 MCG tablet Take 1 tablet (88 mcg total) by mouth daily before breakfast. 90 tablet 3   lisinopril (ZESTRIL) 10 MG tablet Take 10 mg by mouth daily.     metoprolol succinate (TOPROL-XL) 25 MG 24 hr tablet Take 0.5 tablets (12.5 mg total) by mouth daily. Take with or immediately following a meal. 45 tablet 3   nitroGLYCERIN (NITROSTAT) 0.4 MG SL tablet Place 1 tablet (0.4 mg total) under the tongue every 5 (five) minutes as needed for chest pain. 30 tablet 2   rosuvastatin (CRESTOR) 20 MG tablet Take 1 tablet (20 mg total) by mouth daily. 90 tablet 3   No current facility-administered medications on file prior to visit.     Review of Systems  Constitutional:  Negative for activity change, appetite change, fatigue, fever and unexpected weight change.  HENT:  Negative for congestion, rhinorrhea, sore  throat and trouble swallowing.   Eyes:  Negative for pain, redness, itching and visual disturbance.  Respiratory:  Negative for cough, chest tightness, shortness of breath and wheezing.   Cardiovascular:  Negative for chest pain and palpitations.  Gastrointestinal:  Positive for constipation. Negative for abdominal pain, blood in stool, diarrhea and nausea.  Endocrine: Negative for cold intolerance, heat intolerance, polydipsia and polyuria.  Genitourinary:  Positive for frequency and urgency. Negative for difficulty urinating and dysuria.  Musculoskeletal:  Negative for arthralgias, joint swelling and myalgias.  Skin:  Negative for pallor and  rash.  Neurological:  Negative for dizziness, tremors, weakness, numbness and headaches.  Hematological:  Negative for adenopathy. Does not bruise/bleed easily.  Psychiatric/Behavioral:  Negative for decreased concentration and dysphoric mood. The patient is not nervous/anxious.        Objective:   Physical Exam Constitutional:      General: He is not in acute distress.    Appearance: Normal appearance. He is well-developed and normal weight. He is not ill-appearing.  HENT:     Head: Normocephalic and atraumatic.  Eyes:     Conjunctiva/sclera: Conjunctivae normal.     Pupils: Pupils are equal, round, and reactive to light.  Neck:     Thyroid: No thyromegaly.     Vascular: No carotid bruit or JVD.  Cardiovascular:     Rate and Rhythm: Normal rate and regular rhythm.     Heart sounds: Normal heart sounds.     No gallop.  Pulmonary:     Effort: Pulmonary effort is normal. No respiratory distress.     Breath sounds: Normal breath sounds. No wheezing or rales.  Abdominal:     General: There is no distension or abdominal bruit.     Palpations: Abdomen is soft. There is no mass.     Tenderness: There is no abdominal tenderness. There is no guarding or rebound.     Comments: No suprapubic tenderness or fullness    Musculoskeletal:     Cervical  back: Normal range of motion and neck supple.     Right lower leg: No edema.     Left lower leg: No edema.  Lymphadenopathy:     Cervical: No cervical adenopathy.  Skin:    General: Skin is warm and dry.     Coloration: Skin is not jaundiced or pale.     Findings: No bruising or rash.  Neurological:     Mental Status: He is alert.     Coordination: Coordination normal.     Deep Tendon Reflexes: Reflexes are normal and symmetric. Reflexes normal.  Psychiatric:        Mood and Affect: Mood normal.           Assessment & Plan:   Problem List Items Addressed This Visit       Cardiovascular and Mediastinum   Primary hypertension    bp in fair control at this time  BP Readings from Last 1 Encounters:  10/03/22 114/68  No changes needed Most recent labs reviewed  Disc lifstyle change with low sodium diet and exercise  Plan to continue Lisinopril 10 mg daily  Metoprolol xl 12.5 mg daily      Relevant Orders   Basic metabolic panel (Completed)     Genitourinary   Benign prostatic hyperplasia    With nocturia  Reviewed recent urology notes  He tolerates symptoms and declines medication at this time  Will continue to monitor   Flomax may be option later with caution of hypotension       Bladder wall thickening    Reviewed urology notes with reassuring cystoscopy  Also micro hematuria  Will f/u 3 mo           Other   Anemia - Primary    ? If poss due to micro hematuria (worked up by urology) , but nl iron and high ferritin and baseline thrombocytopenia   No new symptoms  Repeat cbc and iron labs today      Relevant Orders   CBC with Differential/Platelet (Completed)   Ferritin (  Completed)   Iron (Completed)   Basic metabolic panel (Completed)   Elevated ferritin   Relevant Orders   CBC with Differential/Platelet (Completed)   Ferritin (Completed)   Iron (Completed)   Erythropoietin   Other Visit Diagnoses     Need for influenza vaccination        Relevant Orders   Flu Vaccine QUAD High Dose(Fluad) (Completed)

## 2022-10-04 LAB — FERRITIN: Ferritin: 294.8 ng/mL (ref 22.0–322.0)

## 2022-10-06 NOTE — Assessment & Plan Note (Signed)
bp in fair control at this time  BP Readings from Last 1 Encounters:  10/03/22 114/68   No changes needed Most recent labs reviewed  Disc lifstyle change with low sodium diet and exercise  Plan to continue Lisinopril 10 mg daily  Metoprolol xl 12.5 mg daily

## 2022-10-06 NOTE — Assessment & Plan Note (Signed)
?   If poss due to micro hematuria (worked up by urology) , but nl iron and high ferritin and baseline thrombocytopenia   No new symptoms  Repeat cbc and iron labs today

## 2022-10-06 NOTE — Assessment & Plan Note (Signed)
Reviewed urology notes with reassuring cystoscopy  Also micro hematuria  Will f/u 3 mo

## 2022-10-06 NOTE — Assessment & Plan Note (Signed)
With nocturia  Reviewed recent urology notes  He tolerates symptoms and declines medication at this time  Will continue to monitor   Flomax may be option later with caution of hypotension

## 2022-10-07 ENCOUNTER — Telehealth: Payer: Self-pay | Admitting: Family Medicine

## 2022-10-07 LAB — ERYTHROPOIETIN: Erythropoietin: 9.4 m[IU]/mL (ref 2.6–18.5)

## 2022-10-07 NOTE — Telephone Encounter (Signed)
Patient returned call regarding lab results,I relied message form Dr Kellie Simmering approved.

## 2022-10-28 ENCOUNTER — Ambulatory Visit (INDEPENDENT_AMBULATORY_CARE_PROVIDER_SITE_OTHER): Payer: Medicare Other | Admitting: Dermatology

## 2022-10-28 VITALS — BP 141/74 | HR 65

## 2022-10-28 DIAGNOSIS — Z1283 Encounter for screening for malignant neoplasm of skin: Secondary | ICD-10-CM | POA: Diagnosis not present

## 2022-10-28 DIAGNOSIS — L820 Inflamed seborrheic keratosis: Secondary | ICD-10-CM | POA: Diagnosis not present

## 2022-10-28 DIAGNOSIS — D489 Neoplasm of uncertain behavior, unspecified: Secondary | ICD-10-CM

## 2022-10-28 DIAGNOSIS — L578 Other skin changes due to chronic exposure to nonionizing radiation: Secondary | ICD-10-CM | POA: Diagnosis not present

## 2022-10-28 DIAGNOSIS — L720 Epidermal cyst: Secondary | ICD-10-CM | POA: Diagnosis not present

## 2022-10-28 DIAGNOSIS — L570 Actinic keratosis: Secondary | ICD-10-CM | POA: Diagnosis not present

## 2022-10-28 DIAGNOSIS — Z85828 Personal history of other malignant neoplasm of skin: Secondary | ICD-10-CM | POA: Diagnosis not present

## 2022-10-28 DIAGNOSIS — L821 Other seborrheic keratosis: Secondary | ICD-10-CM | POA: Diagnosis not present

## 2022-10-28 DIAGNOSIS — L814 Other melanin hyperpigmentation: Secondary | ICD-10-CM | POA: Diagnosis not present

## 2022-10-28 DIAGNOSIS — D229 Melanocytic nevi, unspecified: Secondary | ICD-10-CM | POA: Diagnosis not present

## 2022-10-28 HISTORY — DX: Actinic keratosis: L57.0

## 2022-10-28 NOTE — Patient Instructions (Addendum)
Biopsy Wound Care Instructions  Leave the original bandage on for 24 hours if possible.  If the bandage becomes soaked or soiled before that time, it is OK to remove it and examine the wound.  A small amount of post-operative bleeding is normal.  If excessive bleeding occurs, remove the bandage, place gauze over the site and apply continuous pressure (no peeking) over the area for 30 minutes. If this does not work, please call our clinic as soon as possible or page your doctor if it is after hours.   Once a day, cleanse the wound with soap and water. It is fine to shower. If a thick crust develops you may use a Q-tip dipped into dilute hydrogen peroxide (mix 1:1 with water) to dissolve it.  Hydrogen peroxide can slow the healing process, so use it only as needed.    After washing, apply petroleum jelly (Vaseline) or an antibiotic ointment if your doctor prescribed one for you, followed by a bandage.    For best healing, the wound should be covered with a layer of ointment at all times. If you are not able to keep the area covered with a bandage to hold the ointment in place, this may mean re-applying the ointment several times a day.  Continue this wound care until the wound has healed and is no longer open.   Itching and mild discomfort is normal during the healing process. However, if you develop pain or severe itching, please call our office.   If you have any discomfort, you can take Tylenol (acetaminophen) or ibuprofen as directed on the bottle. (Please do not take these if you have an allergy to them or cannot take them for another reason).  Some redness, tenderness and white or yellow material in the wound is normal healing.  If the area becomes very sore and red, or develops a thick yellow-green material (pus), it may be infected; please notify us.    If you have stitches, return to clinic as directed to have the stitches removed. You will continue wound care for 2-3 days after  the stitches are removed.   Wound healing continues for up to one year following surgery. It is not unusual to experience pain in the scar from time to time during the interval.  If the pain becomes severe or the scar thickens, you should notify the office.    A slight amount of redness in a scar is expected for the first six months.  After six months, the redness will fade and the scar will soften and fade.  The color difference becomes less noticeable with time.  If there are any problems, return for a post-op surgery check at your earliest convenience.  To improve the appearance of the scar, you can use silicone scar gel, cream, or sheets (such as Mederma or Serica) every night for up to one year. These are available over the counter (without a prescription).  Please call our office at [phone] for any questions or concerns.    Electrodesiccation and Curettage ("Scrape and Burn") Wound Care Instructions  Leave the original bandage on for 24 hours if possible.  If the bandage becomes soaked or soiled before that time, it is OK to remove it and examine the wound.  A small amount of post-operative bleeding is normal.  If excessive bleeding occurs, remove the bandage, place gauze over the site and apply continuous pressure (no peeking) over the area for 30 minutes. If this  does not work, please call our clinic as soon as possible or page your doctor if it is after hours.   Once a day, cleanse the wound with soap and water. It is fine to shower. If a thick crust develops you may use a Q-tip dipped into dilute hydrogen peroxide (mix 1:1 with water) to dissolve it.  Hydrogen peroxide can slow the healing process, so use it only as needed.    After washing, apply petroleum jelly (Vaseline) or an antibiotic ointment if your doctor prescribed one for you, followed by a bandage.    For best healing, the wound should be covered with a layer of ointment at all times. If you are not able to keep the  area covered with a bandage to hold the ointment in place, this may mean re-applying the ointment several times a day.  Continue this wound care until the wound has healed and is no longer open. It may take several weeks for the wound to heal and close.  Itching and mild discomfort is normal during the healing process.  If you have any discomfort, you can take Tylenol (acetaminophen) or ibuprofen as directed on the bottle. (Please do not take these if you have an allergy to them or cannot take them for another reason).  Some redness, tenderness and white or yellow material in the wound is normal healing.  If the area becomes very sore and red, or develops a thick yellow-green material (pus), it may be infected; please notify us.    Wound healing continues for up to one year following surgery. It is not unusual to experience pain in the scar from time to time during the interval.  If the pain becomes severe or the scar thickens, you should notify the office.    A slight amount of redness in a scar is expected for the first six months.  After six months, the redness will fade and the scar will soften and fade.  The color difference becomes less noticeable with time.  If there are any problems, return for a post-op surgery check at your earliest convenience.  To improve the appearance of the scar, you can use silicone scar gel, cream, or sheets (such as Mederma or Serica) every night for up to one year. These are available over the counter (without a prescription).  Please call our office at [phone] for any questions or concerns.Actinic keratoses are precancerous spots that appear secondary to cumulative UV radiation exposure/sun exposure over time. They are chronic with expected duration over 1 year. A portion of actinic keratoses will progress to squamous cell carcinoma of the skin. It is not possible to reliably predict which spots will progress to skin cancer and so treatment is recommended to  prevent development of skin cancer.  Recommend daily broad spectrum sunscreen SPF 30+ to sun-exposed areas, reapply every 2 hours as needed.  Recommend staying in the shade or wearing long sleeves, sun glasses (UVA+UVB protection) and wide brim hats (4-inch brim around the entire circumference of the hat). Call for new or changing lesions.   Cryotherapy Aftercare  Wash gently with soap and water everyday.   Apply Vaseline and Band-Aid daily until healed.   Seborrheic Keratosis  What causes seborrheic keratoses? Seborrheic keratoses are harmless, common skin growths that first appear during adult life.  As time goes by, more growths appear.  Some people may develop a large number of them.  Seborrheic keratoses appear on both covered and uncovered body parts.  They  are not caused by sunlight.  The tendency to develop seborrheic keratoses can be inherited.  They vary in color from skin-colored to gray, brown, or even black.  They can be either smooth or have a rough, warty surface.   Seborrheic keratoses are superficial and look as if they were stuck on the skin.  Under the microscope this type of keratosis looks like layers upon layers of skin.  That is why at times the top layer may seem to fall off, but the rest of the growth remains and re-grows.    Treatment Seborrheic keratoses do not need to be treated, but can easily be removed in the office.  Seborrheic keratoses often cause symptoms when they rub on clothing or jewelry.  Lesions can be in the way of shaving.  If they become inflamed, they can cause itching, soreness, or burning.  Removal of a seborrheic keratosis can be accomplished by freezing, burning, or surgery. If any spot bleeds, scabs, or grows rapidly, please return to have it checked, as these can be an indication of a skin cancer.       Melanoma ABCDEs  Melanoma is the most dangerous type of skin cancer, and is the leading cause of death from skin disease.  You are more  likely to develop melanoma if you: Have light-colored skin, light-colored eyes, or red or blond hair Spend a lot of time in the sun Tan regularly, either outdoors or in a tanning bed Have had blistering sunburns, especially during childhood Have a close family member who has had a melanoma Have atypical moles or large birthmarks  Early detection of melanoma is key since treatment is typically straightforward and cure rates are extremely high if we catch it early.   The first sign of melanoma is often a change in a mole or a new dark spot.  The ABCDE system is a way of remembering the signs of melanoma.  A for asymmetry:  The two halves do not match. B for border:  The edges of the growth are irregular. C for color:  A mixture of colors are present instead of an even brown color. D for diameter:  Melanomas are usually (but not always) greater than 90m - the size of a pencil eraser. E for evolution:  The spot keeps changing in size, shape, and color.  Please check your skin once per month between visits. You can use a small mirror in front and a large mirror behind you to keep an eye on the back side or your body.   If you see any new or changing lesions before your next follow-up, please call to schedule a visit.  Please continue daily skin protection including broad spectrum sunscreen SPF 30+ to sun-exposed areas, reapplying every 2 hours as needed when you're outdoors.   Staying in the shade or wearing long sleeves, sun glasses (UVA+UVB protection) and wide brim hats (4-inch brim around the entire circumference of the hat) are also recommended for sun protection.    Due to recent changes in healthcare laws, you may see results of your pathology and/or laboratory studies on MyChart before the doctors have had a chance to review them. We understand that in some cases there may be results that are confusing or concerning to you. Please understand that not all results are received at the same  time and often the doctors may need to interpret multiple results in order to provide you with the best plan of care or course of treatment.  Therefore, we ask that you please give [place] 2 business days to thoroughly review all your results before contacting the office for clarification. Should we see a critical lab result, you will be contacted sooner.   If You Need Anything After Your Visit  If you have any questions or concerns for your doctor, please call our main line at [phone] and press option 4 to reach your doctor's medical assistant. If no one answers, please leave a voicemail as directed and we will return your call as soon as possible. Messages left after 4 pm will be answered the following business day.   You may also send [place] a message via [place]. We typically respond to MyChart messages within 1-2 business days.  For prescription refills, please ask your pharmacy to contact our office. Our fax number is [phone].  If you have an urgent issue when the clinic is closed that cannot wait until the next business day, you can page your doctor at the number below.    Please note that while we do our best to be available for urgent issues outside of office hours, we are not available 24/7.   If you have an urgent issue and are unable to reach [place], you may choose to seek medical care at your doctor's office, retail clinic, urgent care center, or emergency room.  If you have a medical emergency, please immediately call 911 or go to the emergency department.  Pager Numbers  - Dr. Nehemiah Massed: [phone]  - Dr. Laurence Ferrari: [phone]  - Dr. Nicole Kindred: [phone]  In the event of inclement weather, please call our main line at [phone] for an update on the status of any delays or closures.  Dermatology Medication Tips: Please keep the boxes that topical medications come in in order to help keep track of the instructions about where and how to use these. Pharmacies typically print  the medication instructions only on the boxes and not directly on the medication tubes.   If your medication is too expensive, please contact our office at [phone] option 4 or send [place] a message through [place].   We are unable to tell what your co-pay for medications will be in advance as this is different depending on your insurance coverage. However, we may be able to find a substitute medication at lower cost or fill out paperwork to get insurance to cover a needed medication.   If a prior authorization is required to get your medication covered by your insurance company, please allow [place] 1-2 business days to complete this process.  Drug prices often vary depending on where the prescription is filled and some pharmacies may offer cheaper prices.  The website www.goodrx.com contains coupons for medications through different pharmacies. The prices here do not account for what the cost may be with help from insurance (it may be cheaper with your insurance), but the website can give you the price if you did not use any insurance.  - You can print the associated coupon and take it with your prescription to the pharmacy.  - You may also stop by our office during regular business hours and pick up a GoodRx coupon card.  - If you need your prescription sent electronically to a different pharmacy, notify our office through [facility] or by phone at [phone] option 4.     Si Usted Necesita Algo Despus de Su Visita  Tambin puede enviarnos un mensaje a travs de Pharmacist, community. Por lo general respondemos a los mensajes de [employer]  en el transcurso de 1 a 2 das hbiles.  Para renovar recetas, por favor pida a su farmacia que se ponga en contacto con nuestra oficina. Harland Dingwall de fax es [place] [phone].  Si tiene un asunto urgente cuando la clnica est cerrada y que no puede esperar hasta el siguiente da hbil, puede llamar/localizar a su doctor(a) al nmero que aparece a  continuacin.   Por favor, tenga en cuenta que aunque hacemos todo lo posible para estar disponibles para asuntos urgentes fuera del horario de [place], no estamos disponibles las 24 horas del da, los 7 das de la [place].   Si tiene un problema urgente y no puede comunicarse con nosotros, puede optar por buscar atencin mdica  en el consultorio de su doctor(a), en una clnica privada, en un centro de atencin urgente o en una sala de emergencias.  Si tiene Engineering geologist, por favor llame inmediatamente al 911 o vaya a la sala de emergencias.  Nmeros de bper  - Dr. Nehemiah Massed: [phone]  - Dra. Moye: [phone]  - Dra. Nicole Kindred: [phone]  En caso de inclemencias del [place], por favor llame a Johnsie Kindred principal al [phone] para una actualizacin sobre el [place] de cualquier retraso o cierre.  Consejos para la medicacin en dermatologa: Por favor, guarde las cajas en las que vienen los medicamentos de uso tpico para ayudarle a seguir las instrucciones sobre dnde y cmo usarlos. Las farmacias generalmente imprimen las instrucciones del medicamento slo en las cajas y no directamente en los tubos del [place].   Si su medicamento es muy caro, por favor, pngase en contacto con Zigmund Daniel llamando al [phone] y presione la opcin 4 o envenos un mensaje a travs de Pharmacist, community.   No podemos decirle cul ser su copago por los medicamentos por adelantado ya que esto es diferente dependiendo de la cobertura de su seguro. Sin embargo, es posible que podamos encontrar un medicamento sustituto a Electrical engineer un formulario para que el seguro cubra el medicamento que se considera necesario.   Si se requiere una autorizacin previa para que su compaa de seguros [place] su medicamento, por favor permtanos de 1 a 2 das hbiles para completar este proceso.  Los precios de los medicamentos varan con frecuencia dependiendo del Environmental consultant de dnde se surte la receta  y alguna farmacias pueden ofrecer precios ms baratos.  El sitio web www.goodrx.com tiene cupones para medicamentos de Airline pilot. Los precios aqu no tienen en cuenta lo que podra costar con la ayuda del seguro (puede ser ms barato con su seguro), pero el sitio web puede darle el precio si no utiliz Research scientist (physical sciences).  - Puede imprimir el cupn correspondiente y llevarlo con su receta a la farmacia.  - Tambin puede pasar por nuestra oficina durante el horario de atencin regular y Charity fundraiser una tarjeta de cupones de GoodRx.  - Si necesita que su receta se enve electrnicamente a una farmacia diferente, informe a nuestra oficina a travs de MyChart de [place] o por telfono llamando al [phone] y presione la opcin 4.

## 2022-10-28 NOTE — Progress Notes (Signed)
Follow-Up Visit   Subjective  Jamie Byrd is a 89 y.o. male who presents for the following: Annual Exam (3 month follow up. Ubse. Hx of bcc, hx of scc, hx of aks, reports some spots at hands he would like treated. Patient also reports some spots behind ears. ).  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  The patient presents for Upper Body Skin Exam (UBSE) for skin cancer screening and mole check.  The patient has spots, moles and lesions to be evaluated, some may be new or changing and the patient has concerns that these could be cancer.  The following portions of the chart were reviewed this encounter and updated as appropriate:      Review of Systems: No other skin or systemic complaints except as noted in HPI or Assessment and Plan.   Objective  Well appearing patient in no apparent distress; mood and affect are within normal limits.  All skin waist up examined.  right hand dorsum x 3 , left postauricular x 1, right ear helix x 1, left hand dorsum x 8 , crown x 1 (14) Erythematous thin papules/macules with gritty scale. Keratotic macules and papules Pink keratotic macule adjacent to scar at left postauricular   left wrist x 1, left mandible x 1, right postauricular x 1, left elbow x 1 (4) Erythematous stuck-on, waxy papule  upper nasal dorsum 6 mm pink ulcerated macule       Assessment & Plan  Actinic keratosis (14) right hand dorsum x 3 , left postauricular x 1, right ear helix x 1, left hand dorsum x 8 , crown x 1  Hypertrophic  Actinic keratoses are precancerous spots that appear secondary to cumulative UV radiation exposure/sun exposure over time. They are chronic with expected duration over 1 year. A portion of actinic keratoses will progress to squamous cell carcinoma of the skin. It is not possible to reliably predict which spots will progress to skin cancer and so treatment is recommended to prevent  development of skin cancer.  Recommend daily broad spectrum sunscreen SPF 30+ to sun-exposed areas, reapply every 2 hours as needed.  Recommend staying in the shade or wearing long sleeves, sun glasses (UVA+UVB protection) and wide brim hats (4-inch brim around the entire circumference of the hat). Call for new or changing lesions.  Destruction of lesion - right hand dorsum x 3 , left postauricular x 1, right ear helix x 1, left hand dorsum x 8 , crown x 1  Destruction method: cryotherapy   Informed consent: discussed and consent obtained   Lesion destroyed using liquid nitrogen: Yes   Region frozen until ice ball extended beyond lesion: Yes   Outcome: patient tolerated procedure well with no complications   Post-procedure details: wound care instructions given   Additional details:  Prior to procedure, discussed risks of blister formation, small wound, skin dyspigmentation, or rare scar following cryotherapy. Recommend Vaseline ointment to treated areas while healing.   Inflamed seborrheic keratosis (4) left wrist x 1, left mandible x 1, right postauricular x 1, left elbow x 1  Symptomatic, irritating, patient would like treated.   Recheck at next follow up left elbow  Destruction of lesion - left wrist x 1, left mandible x 1, right postauricular x 1, left elbow x 1  Destruction method: cryotherapy   Informed consent: discussed and consent obtained   Lesion destroyed using liquid nitrogen: Yes   Region frozen until ice  ball extended beyond lesion: Yes   Outcome: patient tolerated procedure well with no complications   Post-procedure details: wound care instructions given   Additional details:  Prior to procedure, discussed risks of blister formation, small wound, skin dyspigmentation, or rare scar following cryotherapy. Recommend Vaseline ointment to treated areas while healing.   Neoplasm of uncertain behavior upper nasal dorsum  Epidermal / dermal shaving  Lesion diameter  (cm):  0.6 Informed consent: discussed and consent obtained   Patient was prepped and draped in usual sterile fashion: Area prepped with alcohol. Anesthesia: the lesion was anesthetized in a standard fashion   Anesthetic:  1% lidocaine w/ epinephrine 1-100,000 buffered w/ 8.4% NaHCO3 Instrument used: flexible razor blade   Hemostasis achieved with: pressure, aluminum chloride and electrodesiccation   Outcome: patient tolerated procedure well    Destruction of lesion  Destruction method: electrodesiccation and curettage   Informed consent: discussed and consent obtained   Curettage performed in three different directions: Yes   Electrodesiccation performed over the curetted area: Yes   Final wound size (cm):  0.9 Hemostasis achieved with:  pressure, aluminum chloride and electrodesiccation Outcome: patient tolerated procedure well with no complications   Post-procedure details: wound care instructions given   Additional details:  Mupirocin ointment and Bandaid applied   Specimen 1 - Surgical pathology Differential Diagnosis: r/o bcc   Check Margins: No  R/o bcc     Lentigines - Scattered tan macules - Due to sun exposure - Benign-appearing, observe - Recommend daily broad spectrum sunscreen SPF 30+ to sun-exposed areas, reapply every 2 hours as needed. - Call for any changes  Seborrheic Keratoses - Stuck-on, waxy, tan-brown papules and/or plaques  - Benign-appearing - Discussed benign etiology and prognosis. - Observe - Call for any changes  Melanocytic Nevi - Tan-brown and/or pink-flesh-colored symmetric macules and papules - Benign appearing on exam today - Observation - Call clinic for new or changing moles - Recommend daily use of broad spectrum spf 30+ sunscreen to sun-exposed areas.   Hemangiomas - Red papules - Discussed benign nature - Observe - Call for any changes  Actinic Damage - Chronic condition, secondary to cumulative UV/sun exposure - diffuse  scaly erythematous macules with underlying dyspigmentation - Recommend daily broad spectrum sunscreen SPF 30+ to sun-exposed areas, reapply every 2 hours as needed.  - Staying in the shade or wearing long sleeves, sun glasses (UVA+UVB protection) and wide brim hats (4-inch brim around the entire circumference of the hat) are also recommended for sun protection.  - Call for new or changing lesions.  History of Squamous Cell Carcinoma of the Skin - No evidence of recurrence today - Recommend regular full body skin exams - Recommend daily broad spectrum sunscreen SPF 30+ to sun-exposed areas, reapply every 2 hours as needed.  - Call if any new or changing lesions are noted between office visits  History of Basal Cell Carcinoma of the Skin - No evidence of recurrence today - Recommend regular full body skin exams - Recommend daily broad spectrum sunscreen SPF 30+ to sun-exposed areas, reapply every 2 hours as needed.  - Call if any new or changing lesions are noted between office visits  Skin cancer screening performed today. Return in about 3 months (around 01/27/2023) for ak and isk follow up.  I, Ruthell Rummage, CMA, am acting as scribe for Brendolyn Patty, MD.  Documentation: I have reviewed the above documentation for accuracy and completeness, and I agree with the above.  Brendolyn Patty MD

## 2022-10-29 ENCOUNTER — Encounter: Payer: Self-pay | Admitting: Dermatology

## 2022-11-03 ENCOUNTER — Telehealth: Payer: Self-pay

## 2022-11-03 NOTE — Telephone Encounter (Signed)
Advised pt of bx results/sh ?

## 2022-11-03 NOTE — Telephone Encounter (Signed)
-----  Message from Brendolyn Patty, MD sent at 11/03/2022  9:47 AM EST ----- Skin , upper nasal dorsum HYPERTROPHIC ACTINIC KERATOSIS AND SURFACE OF AN EPIDERMOID CYST, ULCERATED  Thick precancer and cyst, already treated with EDC at time of biopsy   - please call patient

## 2022-11-14 ENCOUNTER — Other Ambulatory Visit: Payer: Self-pay | Admitting: Family Medicine

## 2022-11-14 ENCOUNTER — Telehealth: Payer: Self-pay | Admitting: Family Medicine

## 2022-11-14 NOTE — Telephone Encounter (Signed)
Prescription Request  11/14/2022  Is this a "Controlled Substance" medicine? No  LOV: 10/03/2022  What is the name of the medication or equipment? rosuvastatin (CRESTOR) 20 MG tablet   Have you contacted your pharmacy to request a refill? Yes   Which pharmacy would you like this sent to?   [facility], [address] [place] [address] Phone: [phone] Fax: [phone]      Patient notified that their request is being sent to the clinical staff for review and that they should receive a response within 2 business days.   Please advise at Mobile [phone] (mobile)

## 2022-11-14 NOTE — Telephone Encounter (Signed)
Looks like cardiology filled med last, and also changed lisinopril 20 to 10 mg on med list, ? If PCP wants to fill med or have cardiology fill Rxs

## 2022-11-14 NOTE — Telephone Encounter (Signed)
Looks like cardiology fills med last and there is a pending refill request for this med with cardiology

## 2022-12-09 DIAGNOSIS — H353134 Nonexudative age-related macular degeneration, bilateral, advanced atrophic with subfoveal involvement: Secondary | ICD-10-CM | POA: Diagnosis not present

## 2022-12-09 DIAGNOSIS — H43813 Vitreous degeneration, bilateral: Secondary | ICD-10-CM | POA: Diagnosis not present

## 2022-12-09 DIAGNOSIS — H35033 Hypertensive retinopathy, bilateral: Secondary | ICD-10-CM | POA: Diagnosis not present

## 2022-12-09 DIAGNOSIS — H31002 Unspecified chorioretinal scars, left eye: Secondary | ICD-10-CM | POA: Diagnosis not present

## 2022-12-18 ENCOUNTER — Other Ambulatory Visit: Payer: Self-pay

## 2022-12-18 DIAGNOSIS — N401 Enlarged prostate with lower urinary tract symptoms: Secondary | ICD-10-CM

## 2022-12-19 ENCOUNTER — Other Ambulatory Visit: Payer: Medicare Other

## 2022-12-19 ENCOUNTER — Other Ambulatory Visit: Payer: Self-pay | Admitting: Family Medicine

## 2022-12-19 DIAGNOSIS — N401 Enlarged prostate with lower urinary tract symptoms: Secondary | ICD-10-CM

## 2022-12-19 LAB — URINALYSIS, COMPLETE
Bilirubin, UA: NEGATIVE
Glucose, UA: NEGATIVE
Ketones, UA: NEGATIVE
Leukocytes,UA: NEGATIVE
Nitrite, UA: NEGATIVE
Protein,UA: NEGATIVE
RBC, UA: NEGATIVE
Specific Gravity, UA: 1.015 (ref 1.005–1.030)
Urobilinogen, Ur: 1 mg/dL (ref 0.2–1.0)
pH, UA: 6.5 (ref 5.0–7.5)

## 2022-12-19 LAB — MICROSCOPIC EXAMINATION

## 2022-12-23 ENCOUNTER — Telehealth: Payer: Self-pay | Admitting: *Deleted

## 2022-12-23 NOTE — Telephone Encounter (Signed)
-----   Message from Abbie Sons, MD sent at 12/22/2022  9:22 PM EDT ----- Repeat urinalysis showed no microscopic blood.  Schedule follow-up appointment November 2024 with [facility]

## 2022-12-23 NOTE — Telephone Encounter (Signed)
Notified patient as instructed, patient pleased. Discussed follow-up appointments, patient agrees  

## 2023-01-26 ENCOUNTER — Ambulatory Visit (INDEPENDENT_AMBULATORY_CARE_PROVIDER_SITE_OTHER): Payer: Medicare Other | Admitting: Dermatology

## 2023-01-26 ENCOUNTER — Encounter: Payer: Self-pay | Admitting: Dermatology

## 2023-01-26 VITALS — BP 162/80 | HR 59

## 2023-01-26 DIAGNOSIS — Z85828 Personal history of other malignant neoplasm of skin: Secondary | ICD-10-CM | POA: Diagnosis not present

## 2023-01-26 DIAGNOSIS — I781 Nevus, non-neoplastic: Secondary | ICD-10-CM | POA: Diagnosis not present

## 2023-01-26 DIAGNOSIS — L570 Actinic keratosis: Secondary | ICD-10-CM | POA: Diagnosis not present

## 2023-01-26 DIAGNOSIS — Z872 Personal history of diseases of the skin and subcutaneous tissue: Secondary | ICD-10-CM | POA: Diagnosis not present

## 2023-01-26 DIAGNOSIS — L578 Other skin changes due to chronic exposure to nonionizing radiation: Secondary | ICD-10-CM | POA: Diagnosis not present

## 2023-01-26 NOTE — Patient Instructions (Addendum)
Cryotherapy Aftercare  Wash gently with soap and water everyday.   Apply Vaseline Jelly daily until healed.    Recommend daily broad spectrum sunscreen SPF 30+ to sun-exposed areas, reapply every 2 hours as needed. Call for new or changing lesions.  Staying in the shade or wearing long sleeves, sun glasses (UVA+UVB protection) and wide brim hats (4-inch brim around the entire circumference of the hat) are also recommended for sun protection.     Due to recent changes in healthcare laws, you may see results of your pathology and/or laboratory studies on MyChart before the doctors have had a chance to review them. We understand that in some cases there may be results that are confusing or concerning to you. Please understand that not all results are received at the same time and often the doctors may need to interpret multiple results in order to provide you with the best plan of care or course of treatment. Therefore, we ask that you please give us 2 business days to thoroughly review all your results before contacting the office for clarification. Should we see a critical lab result, you will be contacted sooner.   If You Need Anything After Your Visit  If you have any questions or concerns for your doctor, please call our main line at [phone] and press option 4 to reach your doctor's medical assistant. If no one answers, please leave a voicemail as directed and we will return your call as soon as possible. Messages left after 4 pm will be answered the following business day.   You may also send us a message via MyChart. We typically respond to MyChart messages within 1-2 business days.  For prescription refills, please ask your pharmacy to contact our office. Our fax number is [phone].  If you have an urgent issue when the clinic is closed that cannot wait until the next business day, you can page your doctor at the number below.    Please note that while we do our best to be  available for urgent issues outside of office hours, we are not available 24/7.   If you have an urgent issue and are unable to reach us, you may choose to seek medical care at your doctor's office, retail clinic, urgent care center, or emergency room.  If you have a medical emergency, please immediately call 911 or go to the emergency department.  Pager Numbers  - Dr. Kowalski: [phone]  - Dr. Moye: [phone]  - Dr. Stewart: [phone]  In the event of inclement weather, please call our main line at [phone] for an update on the status of any delays or closures.  Dermatology Medication Tips: Please keep the boxes that topical medications come in in order to help keep track of the instructions about where and how to use these. Pharmacies typically print the medication instructions only on the boxes and not directly on the medication tubes.   If your medication is too expensive, please contact our office at [phone] option 4 or send us a message through MyChart.   We are unable to tell what your co-pay for medications will be in advance as this is different depending on your insurance coverage. However, we may be able to find a substitute medication at lower cost or fill out paperwork to get insurance to cover a needed medication.   If a prior authorization is required to get your medication covered by your insurance company, please allow us 1-2 business days to complete this process.    Drug prices often vary depending on where the prescription is filled and some pharmacies may offer cheaper prices.  The website www.goodrx.com contains coupons for medications through different pharmacies. The prices here do not account for what the cost may be with help from insurance (it may be cheaper with your insurance), but the website can give you the price if you did not use any insurance.  - You can print the associated coupon and take it with your prescription to the pharmacy.  -  You may also stop by our office during regular business hours and pick up a GoodRx coupon card.  - If you need your prescription sent electronically to a different pharmacy, notify our office through [facility] MyChart or by phone at [phone] option 4.     Si Usted Necesita Algo Despus de Su Visita  Tambin puede enviarnos un mensaje a travs de MyChart. Por lo general respondemos a los mensajes de MyChart en el transcurso de 1 a 2 das hbiles.  Para renovar recetas, por favor pida a su farmacia que se ponga en contacto con nuestra oficina. Nuestro nmero de fax es el [phone].  Si tiene un asunto urgente cuando la clnica est cerrada y que no puede esperar hasta el siguiente da hbil, puede llamar/localizar a su doctor(a) al nmero que aparece a continuacin.   Por favor, tenga en cuenta que aunque hacemos todo lo posible para estar disponibles para asuntos urgentes fuera del horario de oficina, no estamos disponibles las 24 horas del da, los 7 das de la semana.   Si tiene un problema urgente y no puede comunicarse con nosotros, puede optar por buscar atencin mdica  en el consultorio de su doctor(a), en una clnica privada, en un centro de atencin urgente o en una sala de emergencias.  Si tiene una emergencia mdica, por favor llame inmediatamente al 911 o vaya a la sala de emergencias.  Nmeros de bper  - Dr. Kowalski: [phone]  - Dra. Moye: [phone]  - Dra. Stewart: [phone]  En caso de inclemencias del tiempo, por favor llame a nuestra lnea principal al [phone] para una actualizacin sobre el estado de cualquier retraso o cierre.  Consejos para la medicacin en dermatologa: Por favor, guarde las cajas en las que vienen los medicamentos de uso tpico para ayudarle a seguir las instrucciones sobre dnde y cmo usarlos. Las farmacias generalmente imprimen las instrucciones del medicamento slo en las cajas y no directamente en los tubos del  medicamento.   Si su medicamento es muy caro, por favor, pngase en contacto con nuestra oficina llamando al [phone] y presione la opcin 4 o envenos un mensaje a travs de MyChart.   No podemos decirle cul ser su copago por los medicamentos por adelantado ya que esto es diferente dependiendo de la cobertura de su seguro. Sin embargo, es posible que podamos encontrar un medicamento sustituto a menor costo o llenar un formulario para que el seguro cubra el medicamento que se considera necesario.   Si se requiere una autorizacin previa para que su compaa de seguros cubra su medicamento, por favor permtanos de 1 a 2 das hbiles para completar este proceso.  Los precios de los medicamentos varan con frecuencia dependiendo del lugar de dnde se surte la receta y alguna farmacias pueden ofrecer precios ms baratos.  El sitio web www.goodrx.com tiene cupones para medicamentos de diferentes farmacias. Los precios aqu no tienen en cuenta lo que podra costar con la ayuda del seguro (puede ser ms   barato con su seguro), pero el sitio web puede darle el precio si no utiliz ningn seguro.  - Puede imprimir el cupn correspondiente y llevarlo con su receta a la farmacia.  - Tambin puede pasar por nuestra oficina durante el horario de atencin regular y recoger una tarjeta de cupones de GoodRx.  - Si necesita que su receta se enve electrnicamente a una farmacia diferente, informe a nuestra oficina a travs de MyChart de  o por telfono llamando al [phone] y presione la opcin 4.  

## 2023-01-26 NOTE — Progress Notes (Signed)
Follow-Up Visit   Subjective  Jamie Byrd is a 89 y.o. male who presents for the following: 3 month AK and ISK follow up. Hands, ears, scalp, face, left wrist, left elbow. Tx with LN2 at last visit. H/o multiple skin cancers, h/o Mohs   The following portions of the chart were reviewed this encounter and updated as appropriate: medications, allergies, medical history  Review of Systems:  No other skin or systemic complaints except as noted in HPI or Assessment and Plan.  Objective  Well appearing patient in no apparent distress; mood and affect are within normal limits.   A focused examination was performed of the following areas: Scalp, face, ears, arms, hands  Relevant exam findings are noted in the Assessment and Plan.    Assessment & Plan   ACTINIC KERATOSIS Exam: Erythematous thin papules/macules with gritty scale face, arms, neck  Actinic keratoses are precancerous spots that appear secondary to cumulative UV radiation exposure/sun exposure over time. They are chronic with expected duration over 1 year. A portion of actinic keratoses will progress to squamous cell carcinoma of the skin. It is not possible to reliably predict which spots will progress to skin cancer and so treatment is recommended to prevent development of skin cancer.  Recommend daily broad spectrum sunscreen SPF 30+ to sun-exposed areas, reapply every 2 hours as needed.  Recommend staying in the shade or wearing long sleeves, sun glasses (UVA+UVB protection) and wide brim hats (4-inch brim around the entire circumference of the hat). Call for new or changing lesions.  Treatment Plan:  Prior to procedure, discussed risks of blister formation, small wound, skin dyspigmentation, or rare scar following cryotherapy. Recommend Vaseline ointment to treated areas while healing.  Destruction Procedure Note Destruction method: cryotherapy   Informed consent: discussed and consent obtained   Lesion  destroyed using liquid nitrogen: Yes   Outcome: patient tolerated procedure well with no complications   Post-procedure details: wound care instructions given   Locations: left nasal dorsum x1, left tragus x1, left zygoma x2, left post auricular neck x1, right nasal tip x1, right cheek x2, right forehead x1, right arm x3, left arm x2 # of Lesions Treated: 14  Recheck left postauricular neck at follow up.    HISTORY OF BASAL CELL CARCINOMA OF THE SKIN - No evidence of recurrence today - Recommend regular full body skin exams - Recommend daily broad spectrum sunscreen SPF 30+ to sun-exposed areas, reapply every 2 hours as needed.  - Call if any new or changing lesions are noted between office visits   HISTORY OF SQUAMOUS CELL CARCINOMA OF THE SKIN - No evidence of recurrence today - Recommend regular full body skin exams - Recommend daily broad spectrum sunscreen SPF 30+ to sun-exposed areas, reapply every 2 hours as needed.  - Call if any new or changing lesions are noted between office visits   History of AK with cyst. Biopsy proven. Upper nasal dorsum. Clear today, no sign of recurrence.   SEBORRHEIC KERATOSIS - Stuck-on, waxy, tan-brown papules and/or plaques at face, arms. - Benign-appearing - Discussed benign etiology and prognosis. - Observe - Call for any changes  ACTINIC DAMAGE - chronic, secondary to cumulative UV radiation exposure/sun exposure over time - diffuse scaly erythematous macules with underlying dyspigmentation - Recommend daily broad spectrum sunscreen SPF 30+ to sun-exposed areas, reapply every 2 hours as needed.  - Recommend staying in the shade or wearing long sleeves, sun glasses (UVA+UVB protection) and wide brim hats (4-inch brim  around the entire circumference of the hat). - Call for new or changing lesions.   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  I, Lawson Radar, CMA, am acting as scribe for Willeen Niece, MD.   Documentation: I have reviewed the above documentation for accuracy and completeness, and I agree with the above.  Willeen Niece, MD

## 2023-02-16 ENCOUNTER — Ambulatory Visit (INDEPENDENT_AMBULATORY_CARE_PROVIDER_SITE_OTHER): Payer: Medicare Other

## 2023-02-16 VITALS — Ht 71.0 in | Wt 200.0 lb

## 2023-02-16 DIAGNOSIS — Z0000 Encounter for general adult medical examination without abnormal findings: Secondary | ICD-10-CM

## 2023-02-16 NOTE — Progress Notes (Addendum)
I connected with  Jamie Byrd on 02/16/23 by a audio enabled telemedicine application and verified that I am speaking with the correct person using two identifiers.  Patient Location: Home  Provider Location: Home Office  I discussed the limitations of evaluation and management by telemedicine. The patient expressed understanding and agreed to proceed.  Subjective:   Jamie Byrd is a 89 y.o. male who presents for Medicare Annual/Subsequent preventive examination.  Review of Systems      Cardiac Risk Factors include: advanced age (>44men, >45 women);hypertension;sedentary lifestyle;male gender     Objective:    Today's Vitals   02/16/23 0759  Weight: 200 lb (90.7 kg)  Height: 5\' 11"  (1.803 m)   Body mass index is 27.89 kg/m.     02/16/2023    8:11 AM 02/12/2022    8:37 AM 05/09/2019    2:03 PM 03/15/2019   10:07 AM 04/29/2018    9:41 AM 12/18/2017   10:04 AM 03/23/2017    2:45 PM  Advanced Directives  Does Patient Have a Medical Advance Directive? Yes Yes No No No No No  Type of Estate agent of [place];Living will Healthcare Power of [place];Living will       Copy of Healthcare Power of Attorney in Chart? No - copy requested No - copy requested       Would patient like information on creating a medical advance directive? No - Patient declined   No - Patient declined  Yes (MAU/Ambulatory/Procedural Areas - Information given) No - Patient declined    Current Medications (verified) Outpatient Encounter Medications as of 02/16/2023  Medication Sig   allopurinol (ZYLOPRIM) 100 MG tablet TAKE 1 TABLET BY MOUTH ONCE A DAY   aspirin 81 MG EC tablet Take 1 tablet (81 mg total) by mouth daily. Swallow whole.   levothyroxine (SYNTHROID) 88 MCG tablet Take 1 tablet (88 mcg total) by mouth daily before breakfast.   lisinopril (ZESTRIL) 10 MG tablet Take 1 tablet (10 mg total) by mouth daily.   nitroGLYCERIN (NITROSTAT) 0.4 MG SL tablet Place 1 tablet (0.4 mg  total) under the tongue every 5 (five) minutes as needed for chest pain.   rosuvastatin (CRESTOR) 20 MG tablet TAKE 1 TABLET BY MOUTH ONCE A DAY   metoprolol succinate (TOPROL-XL) 25 MG 24 hr tablet Take 0.5 tablets (12.5 mg total) by mouth daily. Take with or immediately following a meal.   No facility-administered encounter medications on file as of 02/16/2023.    Allergies (verified) Patient has no known allergies.   History: Past Medical History:  Diagnosis Date   Actinic keratosis 10/28/2022   upper nasal dorsum, EDC   BCC (basal cell carcinoma) 09/12/2021   right neck posterior EDC 11/21/2021   BCC (basal cell carcinoma) 09/12/2021   right temple, EDC 02/18/2022   BCC (basal cell carcinoma) 09/12/2021   left temple, Mohs 12/18/21   BCC (basal cell carcinoma) 09/12/2021   left zygoma, Clear with biopsy 02/18/2022   BCC (basal cell carcinoma) 10/24/2021   right nasolabial fold, MOHs 01/01/2022   BPH (benign prostatic hypertrophy)    Difficulty reading    Difficulty seeing    Difficulty in seeing certain things and reading/ not legally blind!   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   right neck anterior, well differentiated, base involved EDC 11/21/2021  Squamous cell carcinoma of skin 11/21/2021   right dorsal hand, EDC   Squamous cell carcinoma of skin 04/21/2022   R med cheek, Atypical Squamous Proliferation w/Cystic Change, exc 06/09/2022  Mohs surgery for a recurrent moderately differentiated SCC R med cheek 07/23/22   Past Surgical History:  Procedure Laterality Date   COLONOSCOPY     CORONARY ATHERECTOMY N/A 09/26/2021   Procedure: CORONARY ATHERECTOMY;  Surgeon: Elder Negus, MD;  Location: [facility];  Service: Cardiovascular;  Laterality: N/A;   CORONARY IMAGING/OCT N/A 09/26/2021   Procedure: INTRAVASCULAR IMAGING/OCT;  Surgeon: Elder Negus, MD;  Location: [facility];  Service: Cardiovascular;  Laterality: N/A;   CORONARY STENT INTERVENTION N/A 09/26/2021   Procedure: CORONARY STENT INTERVENTION;  Surgeon: Elder Negus, MD;  Location: [facility];  Service: Cardiovascular;  Laterality: N/A;   LEFT HEART CATH AND CORONARY ANGIOGRAPHY N/A 09/25/2021   Procedure: LEFT HEART CATH AND CORONARY ANGIOGRAPHY;  Surgeon: Elder Negus, MD;  Location: [facility];  Service: Cardiovascular;  Laterality: N/A;   POLYPECTOMY     Family History  Problem Relation Age of Onset   Heart disease Mother        CABG   Heart disease Father        CAD   Cancer Father        lung CA   Cancer Brother        lung CA   Social History   Socioeconomic History   Marital status: Widowed    Spouse name: Myriam Jacobson   Number of children: 1   Years of education: Not on file   Highest education level: Not on file  Occupational History   Occupation: retired  Tobacco Use   Smoking status: Former    Packs/day: 1.50    Years: 15.00    Additional pack years: 0.00    Total pack years: 22.50    Types: Cigarettes    Quit date: 10/13/1968    Years since quitting: 54.3   Smokeless tobacco: Never  Vaping Use   Vaping Use: Never used  Substance and Sexual Activity   Alcohol use: No    Comment: no alcohol in 20 years   Drug use: No   Sexual activity: Not Currently  Other Topics Concern   Not on file  Social History Narrative   Had one son who passed away in MVA at age 21   He and his wife are retired and disabled, he is blind and she has dementia - his sister drives them to appointments and shopping, etc    Social Determinants of Health   Financial Resource Strain: Low Risk  (02/16/2023)   Overall Financial Resource Strain (CARDIA)    Difficulty of Paying Living Expenses: Not hard at all  Food Insecurity: No Food Insecurity (02/16/2023)   Hunger Vital Sign    Worried About Running Out of Food in the Last Year:  Never true    Ran Out of Food in the Last Year: Never true  Transportation Needs: No Transportation Needs (02/16/2023)   PRAPARE - Administrator, Civil Service (Medical): No    Lack of Transportation (Non-Medical): No  Physical Activity: Inactive (02/16/2023)   Exercise Vital Sign    Days of Exercise per Week: 0 days    Minutes of Exercise per Session: 0 min  Stress: No Stress Concern Present (02/16/2023)   [employer] of Occupational Health - Occupational Stress Questionnaire  Feeling of Stress : Not at all  Social Connections: Socially Isolated (02/16/2023)   Social Connection and Isolation Panel [NHANES]    Frequency of Communication with Friends and Family: More than three times a week    Frequency of Social Gatherings with Friends and Family: Twice a week    Attends Religious Services: Never    Database administrator or Organizations: No    Attends Banker Meetings: Never    Marital Status: Widowed    Tobacco Counseling Counseling given: Not Answered   Clinical Intake:  Pre-visit preparation completed: Yes  Pain : No/denies pain     Nutritional Risks: None Diabetes: No  How often do you need to have someone help you when you read instructions, pamphlets, or other written materials from your doctor or pharmacy?: 3 - Sometimes What is the last grade level you completed in school?: Sister assists due to difficulty seeing.  Diabetic? no  Interpreter Needed?: No  Information entered by :: C.Elizah Mierzwa LPN   Activities of Daily Living    02/16/2023    8:12 AM  In your present state of health, do you have any difficulty performing the following activities:  Hearing? 0  Vision? 1  Comment macular degeneration  Difficulty concentrating or making decisions? 0  Walking or climbing stairs? 0  Dressing or bathing? 0  Doing errands, shopping? 1  Comment Sister assists  Quarry manager and eating ? N  Using the Toilet? N  In the past six  months, have you accidently leaked urine? Y  Comment occasionally  Do you have problems with loss of bowel control? N  Managing your Medications? N  Managing your Finances? Y  Comment Sister assists  Housekeeping or managing your Housekeeping? N    Patient Care Team: Tower, Audrie Gallus, MD as PCP - Pervis Hocking, MD as Consulting Physician (Dermatology) Maeola Sarah, MD as Referring Physician (Ophthalmology)  Indicate any recent Medical Services you may have received from other than [facility] providers in the past year (date may be approximate).     Assessment:   This is a routine wellness examination for Jaris.  Hearing/Vision screen Hearing Screening - Comments:: No aids Vision Screening - Comments:: None - [facility]  Dietary issues and exercise activities discussed: Current Exercise Habits: The patient does not participate in regular exercise at present, Exercise limited by: None identified   Goals Addressed             This Visit's Progress    Patient Stated       No new goals       Depression Screen    02/16/2023    8:10 AM 05/26/2022    9:34 AM 02/12/2022    8:32 AM 05/23/2021    9:20 AM 05/17/2020   12:14 PM 05/09/2019    2:04 PM 12/18/2017   10:04 AM  PHQ 2/9 Scores  PHQ - 2 Score 0 0 0 0 0 0 0  PHQ- 9 Score      0 0    Fall Risk    02/16/2023    8:06 AM 05/26/2022    9:34 AM 02/12/2022    8:22 AM 05/23/2021    9:21 AM 05/17/2020   12:14 PM  Fall Risk   Falls in the past year? 0 0 0 0 0  Number falls in past yr: 0  0    Injury with Fall? 0  0    Risk for fall due to : Impaired  vision  Impaired vision;Mental status change    Risk for fall due to: Comment Macular degeneration      Follow up Falls prevention discussed;Falls evaluation completed;Education provided Falls evaluation completed Falls prevention discussed;Education provided Falls evaluation completed Falls evaluation completed    FALL RISK PREVENTION PERTAINING TO THE HOME:  Any stairs in  or around the home? No  If so, are there any without handrails? No  Home free of loose throw rugs in walkways, pet beds, electrical cords, etc? Yes  Adequate lighting in your home to reduce risk of falls? Yes   ASSISTIVE DEVICES UTILIZED TO PREVENT FALLS:  Life alert? No  Use of a cane, walker or w/c? No  Grab bars in the bathroom? Yes  Shower chair or bench in shower? Yes  Elevated toilet seat or a handicapped toilet? Yes    Cognitive Function:    05/09/2019    2:08 PM 12/18/2017   10:10 AM 12/17/2016    9:30 AM 12/13/2015   10:19 AM  MMSE - Mini Mental State Exam  Orientation to time 5 5 5 5   Orientation to Place 5 5 5 5   Registration 3 3 3 3   Attention/ Calculation 0 0 0 0  Attention/Calculation-comments Patient can not spell     Recall 3 3 3  0  Language- name 2 objects 0 0 0 0  Language- repeat 1 1 1 1   Language- follow 3 step command 0 3 3 3   Language- read & follow direction 0 0 0 1  Write a sentence 0 0 0 0  Copy design 0 0 0 0  Total score 17 20 20 18         02/16/2023    8:14 AM 02/12/2022    8:28 AM  6CIT Screen  What Year? 0 points 0 points  What month? 0 points 0 points  What time? 0 points 0 points  Count back from 20 0 points 0 points  Months in reverse 0 points 4 points  Repeat phrase 0 points 2 points  Total Score 0 points 6 points    Immunizations Immunization History  Administered Date(s) Administered   Fluad Quad(high Dose 65+) 07/15/2019, 07/19/2020, 10/03/2022   Influenza Split 09/17/2011, 07/23/2012   Influenza Whole 08/25/2006, 09/10/2009, 09/25/2010   Influenza, High Dose Seasonal PF 07/29/2018   Influenza, Seasonal, Injecte, Preservative Fre 06/28/2015   Influenza,inj,Quad PF,6+ Mos 07/07/2013, 07/19/2014, 08/08/2016, 07/16/2017   Influenza-Unspecified 06/28/2015   PFIZER(Purple Top)SARS-COV-2 Vaccination 12/06/2019, 12/27/2019   Pneumococcal Conjugate-13 12/11/2014   Pneumococcal Polysaccharide-23 09/17/2011   Td 07/14/1999, 09/10/2009     TDAP status: Due, Education has been provided regarding the importance of this vaccine. Advised may receive this vaccine at local pharmacy or Health Dept. Aware to provide a copy of the vaccination record if obtained from local pharmacy or Health Dept. Verbalized acceptance and understanding.  Flu Vaccine status: Up to date  Pneumococcal vaccine status: Up to date  Covid-19 vaccine status: Declined, Education has been provided regarding the importance of this vaccine but patient still declined. Advised may receive this vaccine at local pharmacy or Health Dept.or vaccine clinic. Aware to provide a copy of the vaccination record if obtained from local pharmacy or Health Dept. Verbalized acceptance and understanding.  Qualifies for Shingles Vaccine? Yes   Zostavax completed No   Shingrix Completed?: No.    Education has been provided regarding the importance of this vaccine. Patient has been advised to call insurance company to determine out of pocket expense if  they have not yet received this vaccine. Advised may also receive vaccine at local pharmacy or Health Dept. Verbalized acceptance and understanding.  Screening Tests Health Maintenance  Topic Date Due   Zoster Vaccines- Shingrix (1 of 2) Never done   DTaP/Tdap/Td (3 - Tdap) 09/11/2019   COVID-19 Vaccine (3 - Pfizer risk series) 01/24/2020   INFLUENZA VACCINE  05/14/2023   Medicare Annual Wellness (AWV)  02/16/2024   Pneumonia Vaccine 79+ Years old  Completed   HPV VACCINES  Aged Out    Health Maintenance  Health Maintenance Due  Topic Date Due   Zoster Vaccines- Shingrix (1 of 2) Never done   DTaP/Tdap/Td (3 - Tdap) 09/11/2019   COVID-19 Vaccine (3 - Pfizer risk series) 01/24/2020    Colorectal cancer screening: No longer required.   Lung Cancer Screening: (Low Dose CT Chest recommended if Age 89-80 years, 30 pack-year currently smoking OR have quit w/in 15years.) does not qualify.   Lung Cancer Screening Referral:   no  Additional Screening:  Hepatitis C Screening: does not qualify; Completed no  Vision Screening: Recommended annual ophthalmology exams for early detection of glaucoma and other disorders of the eye. Is the patient up to date with their annual eye exam?  Yes  Who is the provider or what is the name of the office in which the patient attends annual eye exams?  [facility] If pt is not established with a provider, would they like to be referred to a provider to establish care? Yes .   Dental Screening: Recommended annual dental exams for proper oral hygiene  Community Resource Referral / Chronic Care Management: CRR required this visit?  No   CCM required this visit?  No      Plan:     I have personally reviewed and noted the following in the patient's chart:   Medical and social history Use of alcohol, tobacco or illicit drugs  Current medications and supplements including opioid prescriptions. Patient is not currently taking opioid prescriptions. Functional ability and status Nutritional status Physical activity Advanced directives List of other physicians Hospitalizations, surgeries, and ER visits in previous 12 months Vitals Screenings to include cognitive, depression, and falls Referrals and appointments  In addition, I have reviewed and discussed with patient certain preventive protocols, quality metrics, and best practice recommendations. A written personalized care plan for preventive services as well as general preventive health recommendations were provided to patient.     Maryan Puls, LPN   [id number]/06/8118   Nurse Notes: none

## 2023-02-16 NOTE — Patient Instructions (Signed)
[name] , Thank you for taking time to come for your Medicare Wellness Visit. I appreciate your ongoing commitment to your health goals. Please review the following plan we discussed and let me know if I can assist you in the future.   These are the goals we discussed:  Goals      Increase physical activity     02/12/2022 - maintain as much independence as possible and get transportation assistance     Patient Stated     No new goals        This is a list of the screening recommended for you and due dates:  Health Maintenance  Topic Date Due   Zoster (Shingles) Vaccine (1 of 2) Never done   DTaP/Tdap/Td vaccine (3 - Tdap) 09/11/2019   COVID-19 Vaccine (3 - Pfizer risk series) 01/24/2020   Flu Shot  05/14/2023   Medicare Annual Wellness Visit  02/16/2024   Pneumonia Vaccine  Completed   HPV Vaccine  Aged Out    Advanced directives: Please bring a copy of your health care power of attorney and living will to the office to be added to your chart at your convenience.   Conditions/risks identified: Aim for 30 minutes of exercise or brisk walking, 6-8 glasses of water, and 5 servings of fruits and vegetables each day.   Next appointment: Follow up in one year for your annual wellness visit. 02/17/24 @ 08:15 televisit  Preventive Care 65 Years and Older, Male  Preventive care refers to lifestyle choices and visits with your health care provider that can promote health and wellness. What does preventive care include? A yearly physical exam. This is also called an annual well check. Dental exams once or twice a year. Routine eye exams. Ask your health care provider how often you should have your eyes checked. Personal lifestyle choices, including: Daily care of your teeth and gums. Regular physical activity. Eating a healthy diet. Avoiding tobacco and drug use. Limiting alcohol use. Practicing safe sex. Taking low doses of aspirin every day. Taking vitamin and mineral  supplements as recommended by your health care provider. What happens during an annual well check? The services and screenings done by your health care provider during your annual well check will depend on your age, overall health, lifestyle risk factors, and family history of disease. Counseling  Your health care provider may ask you questions about your: Alcohol use. Tobacco use. Drug use. Emotional well-being. Home and relationship well-being. Sexual activity. Eating habits. History of falls. Memory and ability to understand (cognition). Work and work Astronomer. Screening  You may have the following tests or measurements: Height, weight, and BMI. Blood pressure. Lipid and cholesterol levels. These may be checked every 5 years, or more frequently if you are over 60 years old. Skin check. Lung cancer screening. You may have this screening every year starting at age 87 if you have a 30-pack-year history of smoking and currently smoke or have quit within the past 15 years. Fecal occult blood test (FOBT) of the stool. You may have this test every year starting at age 87. Flexible sigmoidoscopy or colonoscopy. You may have a sigmoidoscopy every 5 years or a colonoscopy every 10 years starting at age 87. Prostate cancer screening. Recommendations will vary depending on your family history and other risks. Hepatitis C blood test. Hepatitis B blood test. Sexually transmitted disease (STD) testing. Diabetes screening. This is done by checking your blood sugar (glucose) after you have not eaten for a  while (fasting). You may have this done every 1-3 years. Abdominal aortic aneurysm (AAA) screening. You may need this if you are a current or former smoker. Osteoporosis. You may be screened starting at age 87 if you are at high risk. Talk with your health care provider about your test results, treatment options, and if necessary, the need for more tests. Vaccines  Your health care provider  may recommend certain vaccines, such as: Influenza vaccine. This is recommended every year. Tetanus, diphtheria, and acellular pertussis (Tdap, Td) vaccine. You may need a Td booster every 10 years. Zoster vaccine. You may need this after age 87. Pneumococcal 13-valent conjugate (PCV13) vaccine. One dose is recommended after age 87. Pneumococcal polysaccharide (PPSV23) vaccine. One dose is recommended after age 87. Talk to your health care provider about which screenings and vaccines you need and how often you need them. This information is not intended to replace advice given to you by your health care provider. Make sure you discuss any questions you have with your health care provider. Document Released: 10/26/2015 Document Revised: 06/18/2016 Document Reviewed: 07/31/2015 Elsevier Interactive Patient Education  2017 [place] Prevention in the Home Falls can cause injuries. They can happen to people of all ages. There are many things you can do to make your home safe and to help prevent falls. What can I do on the outside of my home? Regularly fix the edges of walkways and driveways and fix any cracks. Remove anything that might make you trip as you walk through a door, such as a raised step or threshold. Trim any bushes or trees on the path to your home. Use bright outdoor lighting. Clear any walking paths of anything that might make someone trip, such as rocks or tools. Regularly check to see if handrails are loose or broken. Make sure that both sides of any steps have handrails. Any raised decks and porches should have guardrails on the edges. Have any leaves, snow, or ice cleared regularly. Use sand or salt on walking paths during winter. Clean up any spills in your garage right away. This includes oil or grease spills. What can I do in the bathroom? Use night lights. Install grab bars by the toilet and in the tub and shower. Do not use towel bars as grab bars. Use  non-skid mats or decals in the tub or shower. If you need to sit down in the shower, use a plastic, non-slip stool. Keep the floor dry. Clean up any water that spills on the floor as soon as it happens. Remove soap buildup in the tub or shower regularly. Attach bath mats securely with double-sided non-slip rug tape. Do not have throw rugs and other things on the floor that can make you trip. What can I do in the bedroom? Use night lights. Make sure that you have a light by your bed that is easy to reach. Do not use any sheets or blankets that are too big for your bed. They should not hang down onto the floor. Have a firm chair that has side arms. You can use this for support while you get dressed. Do not have throw rugs and other things on the floor that can make you trip. What can I do in the kitchen? Clean up any spills right away. Avoid walking on wet floors. Keep items that you use a lot in easy-to-reach places. If you need to reach something above you, use a strong step stool that has a grab  bar. Keep electrical cords out of the way. Do not use floor polish or wax that makes floors slippery. If you must use wax, use non-skid floor wax. Do not have throw rugs and other things on the floor that can make you trip. What can I do with my stairs? Do not leave any items on the stairs. Make sure that there are handrails on both sides of the stairs and use them. Fix handrails that are broken or loose. Make sure that handrails are as long as the stairways. Check any carpeting to make sure that it is firmly attached to the stairs. Fix any carpet that is loose or worn. Avoid having throw rugs at the top or bottom of the stairs. If you do have throw rugs, attach them to the floor with carpet tape. Make sure that you have a light switch at the top of the stairs and the bottom of the stairs. If you do not have them, ask someone to add them for you. What else can I do to help prevent falls? Wear  shoes that: Do not have high heels. Have rubber bottoms. Are comfortable and fit you well. Are closed at the toe. Do not wear sandals. If you use a stepladder: Make sure that it is fully opened. Do not climb a closed stepladder. Make sure that both sides of the stepladder are locked into place. Ask someone to hold it for you, if possible. Clearly mark and make sure that you can see: Any grab bars or handrails. First and last steps. Where the edge of each step is. Use tools that help you move around (mobility aids) if they are needed. These include: Canes. Walkers. Scooters. Crutches. Turn on the lights when you go into a dark area. Replace any light bulbs as soon as they burn out. Set up your furniture so you have a clear path. Avoid moving your furniture around. If any of your floors are uneven, fix them. If there are any pets around you, be aware of where they are. Review your medicines with your doctor. Some medicines can make you feel dizzy. This can increase your chance of falling. Ask your doctor what other things that you can do to help prevent falls. This information is not intended to replace advice given to you by your health care provider. Make sure you discuss any questions you have with your health care provider. Document Released: 07/26/2009 Document Revised: 03/06/2016 Document Reviewed: 11/03/2014 Elsevier Interactive Patient Education  2017 [employer].

## 2023-02-17 ENCOUNTER — Other Ambulatory Visit: Payer: Self-pay | Admitting: Family Medicine

## 2023-03-02 ENCOUNTER — Ambulatory Visit: Payer: Medicare Other

## 2023-03-02 DIAGNOSIS — I77810 Thoracic aortic ectasia: Secondary | ICD-10-CM

## 2023-03-02 DIAGNOSIS — I25118 Atherosclerotic heart disease of native coronary artery with other forms of angina pectoris: Secondary | ICD-10-CM

## 2023-03-11 ENCOUNTER — Ambulatory Visit: Payer: Medicare Other

## 2023-03-11 ENCOUNTER — Ambulatory Visit: Payer: Medicare Other | Admitting: Internal Medicine

## 2023-03-11 ENCOUNTER — Encounter: Payer: Self-pay | Admitting: Internal Medicine

## 2023-03-11 VITALS — BP 129/74 | HR 64 | Ht 71.0 in | Wt 203.8 lb

## 2023-03-11 DIAGNOSIS — I77810 Thoracic aortic ectasia: Secondary | ICD-10-CM

## 2023-03-11 DIAGNOSIS — I10 Essential (primary) hypertension: Secondary | ICD-10-CM

## 2023-03-11 DIAGNOSIS — I25118 Atherosclerotic heart disease of native coronary artery with other forms of angina pectoris: Secondary | ICD-10-CM

## 2023-03-11 NOTE — Progress Notes (Signed)
Primary Physician/Referring:  Tower, Audrie Gallus, MD  Patient ID: Jamie Byrd, male    DOB: 22-Nov-1933, 89 y.o.   MRN: [id number]  Chief Complaint  Patient presents with   Coronary Artery Disease   Follow-up   HPI:    Jamie Byrd  is a 89 y.o. Caucasian male with history of hypertension, hypothyroidism, prediabetes, former smoker.  Patient has history of CAD status post complex PCI 09/26/2021 to LAD.   She presents today for 70-month follow-up.  Patient has been feeling well since he was last here. He has not had any bleeding issues or side effects to his medications. He remains active and  tries to stick to a healthy diet. He has no complaints or concerns today. He denies chest pain, shortness of breath, palpitations, leg edema, orthopnea, PND, syncope.  Past Medical History:  Diagnosis Date   Actinic keratosis 10/28/2022   upper nasal dorsum, EDC   BCC (basal cell carcinoma) 09/12/2021   right neck posterior EDC 11/21/2021   BCC (basal cell carcinoma) 09/12/2021   right temple, EDC 02/18/2022   BCC (basal cell carcinoma) 09/12/2021   left temple, Mohs 12/18/21   BCC (basal cell carcinoma) 09/12/2021   left zygoma, Clear with biopsy 02/18/2022   BCC (basal cell carcinoma) 10/24/2021   right nasolabial fold, MOHs 01/01/2022   BPH (benign prostatic hypertrophy)    Difficulty reading    Difficulty seeing    Difficulty in seeing certain things and reading/ not legally blind!   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   right neck anterior, well differentiated, base involved EDC 11/21/2021   Squamous cell carcinoma of skin 11/21/2021   right dorsal hand, EDC   Squamous cell carcinoma of skin 04/21/2022   R med cheek, Atypical Squamous Proliferation w/Cystic Change, exc 06/09/2022  Mohs surgery for a recurrent moderately differentiated SCC R med cheek 07/23/22   Past  Surgical History:  Procedure Laterality Date   COLONOSCOPY     CORONARY ATHERECTOMY N/A 09/26/2021   Procedure: CORONARY ATHERECTOMY;  Surgeon: Elder Negus, MD;  Location: [facility];  Service: Cardiovascular;  Laterality: N/A;   CORONARY IMAGING/OCT N/A 09/26/2021   Procedure: INTRAVASCULAR IMAGING/OCT;  Surgeon: Elder Negus, MD;  Location: [facility];  Service: Cardiovascular;  Laterality: N/A;   CORONARY STENT INTERVENTION N/A 09/26/2021   Procedure: CORONARY STENT INTERVENTION;  Surgeon: Elder Negus, MD;  Location: [facility];  Service: Cardiovascular;  Laterality: N/A;   LEFT HEART CATH AND CORONARY ANGIOGRAPHY N/A 09/25/2021   Procedure: LEFT HEART CATH AND CORONARY ANGIOGRAPHY;  Surgeon: Elder Negus, MD;  Location: [facility];  Service: Cardiovascular;  Laterality: N/A;   POLYPECTOMY     Family History  Problem Relation Age of Onset   Heart disease Mother        CABG   Heart disease Father        CAD   Cancer Father        lung CA   Cancer Brother        lung CA    Social History   Tobacco Use   Smoking status: Former    Packs/day: 1.50    Years: 15.00    Additional pack years: 0.00    Total pack years: 22.50  Types: Cigarettes    Quit date: 10/13/1968    Years since quitting: 54.4   Smokeless tobacco: Never  Substance Use Topics   Alcohol use: No    Comment: no alcohol in 20 years   Marital Status: Married   ROS  Review of Systems  Cardiovascular:  Negative for chest pain, claudication, dyspnea on exertion, leg swelling, near-syncope, orthopnea, palpitations, paroxysmal nocturnal dyspnea and syncope.  Neurological:  Negative for dizziness.    Objective  Blood pressure 129/74, pulse 64, height 5\' 11"  (1.803 m), weight 203 lb 12.8 oz (92.4 kg), SpO2 97 %.     03/11/2023    9:01 AM 03/11/2023    8:57 AM 02/16/2023    7:59 AM  Vitals with BMI  Height  5\' 11"  5\' 11"   Weight  203 lbs 13 oz 200  lbs  BMI  28.44 27.91  Systolic 129 147   Diastolic 74 76   Pulse 64 60       Physical Exam Vitals reviewed.  Neck:     Vascular: No carotid bruit.  Cardiovascular:     Rate and Rhythm: Normal rate and regular rhythm.     Pulses: Intact distal pulses.     Heart sounds: S1 normal and S2 normal. No murmur heard.    No gallop.  Pulmonary:     Effort: Pulmonary effort is normal. No respiratory distress.     Breath sounds: No wheezing, rhonchi or rales.  Musculoskeletal:     Right lower leg: No edema.     Left lower leg: No edema.  Neurological:     Mental Status: He is alert.   Physical exam unchanged compared to previous office visit.   Laboratory examination:   Recent Labs    05/26/22 1003 08/05/22 0842 10/03/22 0951  NA 134* 135 136  K 4.5 4.4 5.0  CL 100 101 102  CO2 27 31 30   GLUCOSE 93 98 100*  BUN 14 10 13   CREATININE 0.98 0.88 0.94  CALCIUM 9.6 9.4 9.8   CrCl cannot be calculated (Patient's most recent lab result is older than the maximum 21 days allowed.).     Latest Ref Rng & Units 10/03/2022    9:51 AM 08/05/2022    8:42 AM 05/26/2022   10:03 AM  CMP  Glucose 70 - 99 mg/dL 562  98  93   BUN 6 - 23 mg/dL 13  10  14    Creatinine 0.40 - 1.50 mg/dL 1.30  8.65  7.84   Sodium 135 - 145 mEq/L 136  135  134   Potassium 3.5 - 5.1 mEq/L 5.0  4.4  4.5   Chloride 96 - 112 mEq/L 102  101  100   CO2 19 - 32 mEq/L 30  31  27    Calcium 8.4 - 10.5 mg/dL 9.8  9.4  9.6   Total Protein 6.0 - 8.3 g/dL  6.3  6.8   Total Bilirubin 0.2 - 1.2 mg/dL  1.0  1.0   Alkaline Phos 39 - 117 U/L  96  94   AST 0 - 37 U/L  21  23   ALT 0 - 53 U/L  10  10       Latest Ref Rng & Units 10/03/2022    9:51 AM 08/05/2022    8:42 AM 05/26/2022   10:03 AM  CBC  WBC 4.0 - 10.5 K/uL 4.8  4.6  4.7   Hemoglobin 13.0 - 17.0 g/dL 69.6  12.0  13.0   Hematocrit 39.0 - 52.0 % 39.8  36.3  39.1   Platelets 150.0 - 400.0 K/uL 135.0  119.0  126.0     Lipid Panel Recent Labs     05/26/22 1003 08/05/22 0842  CHOL 70 77  TRIG 70.0 92.0  LDLCALC 27 28  VLDL 14.0 18.4  HDL 28.20* 16.10*  CHOLHDL 2 3  HEMOGLOBIN A1C Lab Results  Component Value Date   HGBA1C 5.4 08/05/2022   TSH Recent Labs    05/26/22 1003 08/05/22 0842  TSH 3.51 3.72   External labs:  None  Allergies  No Known Allergies   Medications Prior to Visit:   Outpatient Medications Prior to Visit  Medication Sig Dispense Refill   allopurinol (ZYLOPRIM) 100 MG tablet TAKE 1 TABLET BY MOUTH ONCE A DAY 90 tablet 3   aspirin 81 MG EC tablet Take 1 tablet (81 mg total) by mouth daily. Swallow whole. 30 tablet 2   levothyroxine (SYNTHROID) 88 MCG tablet Take 1 tablet (88 mcg total) by mouth daily before breakfast. 90 tablet 3   lisinopril (ZESTRIL) 10 MG tablet Take 1 tablet (10 mg total) by mouth daily. 90 tablet 1   metoprolol succinate (TOPROL-XL) 25 MG 24 hr tablet Take 0.5 tablets (12.5 mg total) by mouth daily. Take with or immediately following a meal. 45 tablet 3   nitroGLYCERIN (NITROSTAT) 0.4 MG SL tablet Place 1 tablet (0.4 mg total) under the tongue every 5 (five) minutes as needed for chest pain. 30 tablet 2   rosuvastatin (CRESTOR) 20 MG tablet TAKE 1 TABLET BY MOUTH ONCE A DAY 90 tablet 1   No facility-administered medications prior to visit.   Final Medications at End of Visit    Current Meds  Medication Sig   allopurinol (ZYLOPRIM) 100 MG tablet TAKE 1 TABLET BY MOUTH ONCE A DAY   aspirin 81 MG EC tablet Take 1 tablet (81 mg total) by mouth daily. Swallow whole.   levothyroxine (SYNTHROID) 88 MCG tablet Take 1 tablet (88 mcg total) by mouth daily before breakfast.   lisinopril (ZESTRIL) 10 MG tablet Take 1 tablet (10 mg total) by mouth daily.   metoprolol succinate (TOPROL-XL) 25 MG 24 hr tablet Take 0.5 tablets (12.5 mg total) by mouth daily. Take with or immediately following a meal.   nitroGLYCERIN (NITROSTAT) 0.4 MG SL tablet Place 1 tablet (0.4 mg total) under the  tongue every 5 (five) minutes as needed for chest pain.   rosuvastatin (CRESTOR) 20 MG tablet TAKE 1 TABLET BY MOUTH ONCE A DAY   Radiology:   No results found.  Cardiac Studies:  PCV ECHOCARDIOGRAM COMPLETE 12/30/2021 Normal LV systolic function with visual EF 60-65%. Left ventricle cavity is normal in size. Normal left ventricular wall thickness. Normal global wall motion. Normal diastolic filling pattern, normal LAP. Trace aortic regurgitation. The aortic root is mildly dilated (Sinus of Valsalva 3.96cm and  Sinotubular junction 3.8cm). Proximal ascending aorta upper limit of normal 3.7cm. Compared to study 09/25/2021 Aortic root was 43mm and now aortic root measures (Sinus of Valsalva 3.96cm and  Sinotubular junction 3.8cm).   Coronary angiogram 09/25/2021: LM: Normal LAD: Ostial 99% stenosis with severe calcification Ramus: Mid 80% eccentric stenosis with severe calcification Lcx: Small OM1 with 50% disease        Large OM1 with mid 30% disease RCA: Prox 50% stenosis with severe calcification   LVEDP normal LVEF normal on echocardiogram   Given location of the ostial LAD lesion with severe  calcification with a large ramus arising at an acute angle with respect to LAD.  Will consult CVTS for possible CABG to LAD and ramus Limiting factors could be age and aorta calcification. 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 He does not have living children, but has brothers and sisters in their 77s. I spoke with his sister Anabel Bene [phone])   If not a surgical candidate, could consider high risk PCI ostial LAD, along with PCI to ramus  Coronary intervention 09/26/2021: Coronary intervention: 80% mid high diag stenosis.  99% ostial LAD stenosis Successful percutaneous coronary intervention mid high diag and ostial LAD        PTCA and overlapping stents placement high diag        3.0 X 22 mm Onyx Frontier drug-eluting stent        3.0 X 15  mm Onyx Frontier drug-eluting stent         Post dilatation with 3.25 mm [place] balloon        80%--->0% residual stenosis. TIMI flow II-->III        PTCA and overlapping stents placement ostial LAD        3.0 X 15 mm Onyx Frontier drug-eluting stent        Post dilatation with 3.5 mm [place] balloon        99%--->0% residual stenosis. TIMI flow II-->III   Echocardiogram 03/02/2023:  Normal LV systolic function with visual EF 55-60%. Left ventricle cavity  is normal in size. Normal left ventricular wall thickness. Normal global  wall motion. Normal diastolic filling pattern, normal LAP. Calculated EF  58%.  Left atrial cavity is mildly dilated at 4.5 cm.  Trileaflet aortic valve. Trace aortic regurgitation. Mild aortic valve  leaflet calcification.  Structurally normal tricuspid valve with trace regurgitation. No evidence  of pulmonary hypertension.  Structurally normal pulmonic valve. Mild pulmonic regurgitation.  No significant change compared to 12/2021. Aorta is no longer dilated.    EKG:   EKG 09/08/2022: Sinus bradycardia with first-degree AV block at rate of 59 bpm.  Normal axis.  Diffuse low voltage complexes, old anterior septal infarct.  Compared to previous EKG on 10/09/2022, no significant change.  Assessment     ICD-10-CM   1. Coronary artery disease of native artery of native heart with stable angina pectoris (HCC)  I25.118     2. Primary hypertension  I10     3. Dilated aortic root (HCC)  I77.810          There are no discontinued medications.   No orders of the defined types were placed in this encounter.   Recommendations:   Jamie Byrd is a 89 y.o. Caucasian male with history of hypertension, hypothyroidism, prediabetes, former smoker.  Presented with unstable angina 09/25/2021 with high grade 99% ostial LAD lesion and another 80% lesion in ramus/high diag. He underwent successful atherectomy and OCT guided complex PCI (09/26/2021) after patient refused to  consider CABG.  CAD: No recurrence of chest pain. He continues on baby aspirin daily Continue rosuvastatin, metoprolol, lisinopril.  Hypertension: Blood pressure is well controlled.   Continue current medications without changes. Discussed the importance of low-sodium diet less than 2000 mg daily and exercise.   Mild aortic root dilation: Mild dilation of aortic root remains stable. Repeat echo shows stable aortic root  Follow up in 6 months, sooner if needed.    Clotilde Dieter, DO 03/13/2023, 10:08 AM Office: [phone]

## 2023-03-29 IMAGING — CR DG CHEST 2V
2 series · 2 of 2 positions shown · non-contrast
Comparison: 03/15/2019

CLINICAL DATA: Intermittent chest pain for several days

EXAM:
CHEST - 2 VIEW

[chest pa]
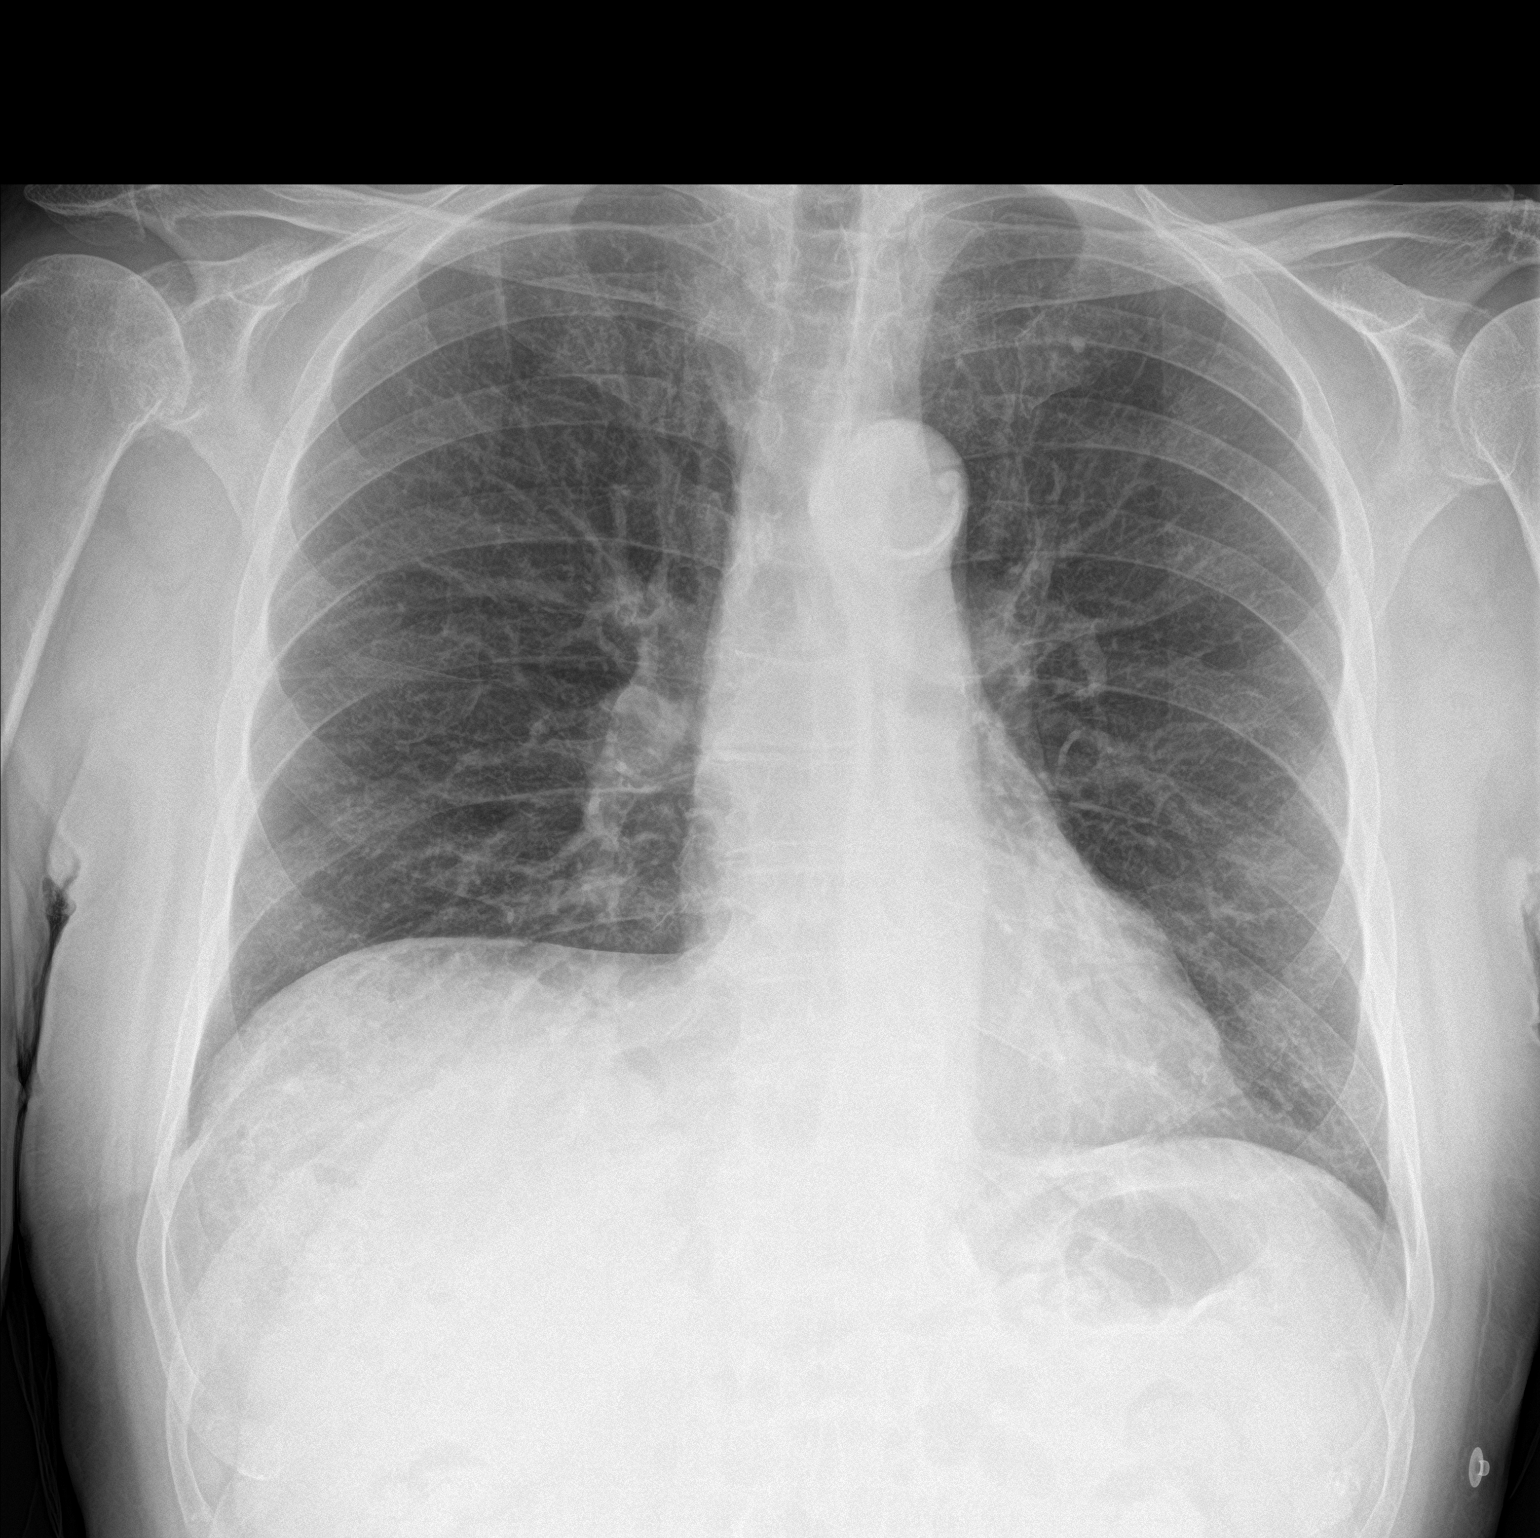

[chest lat]
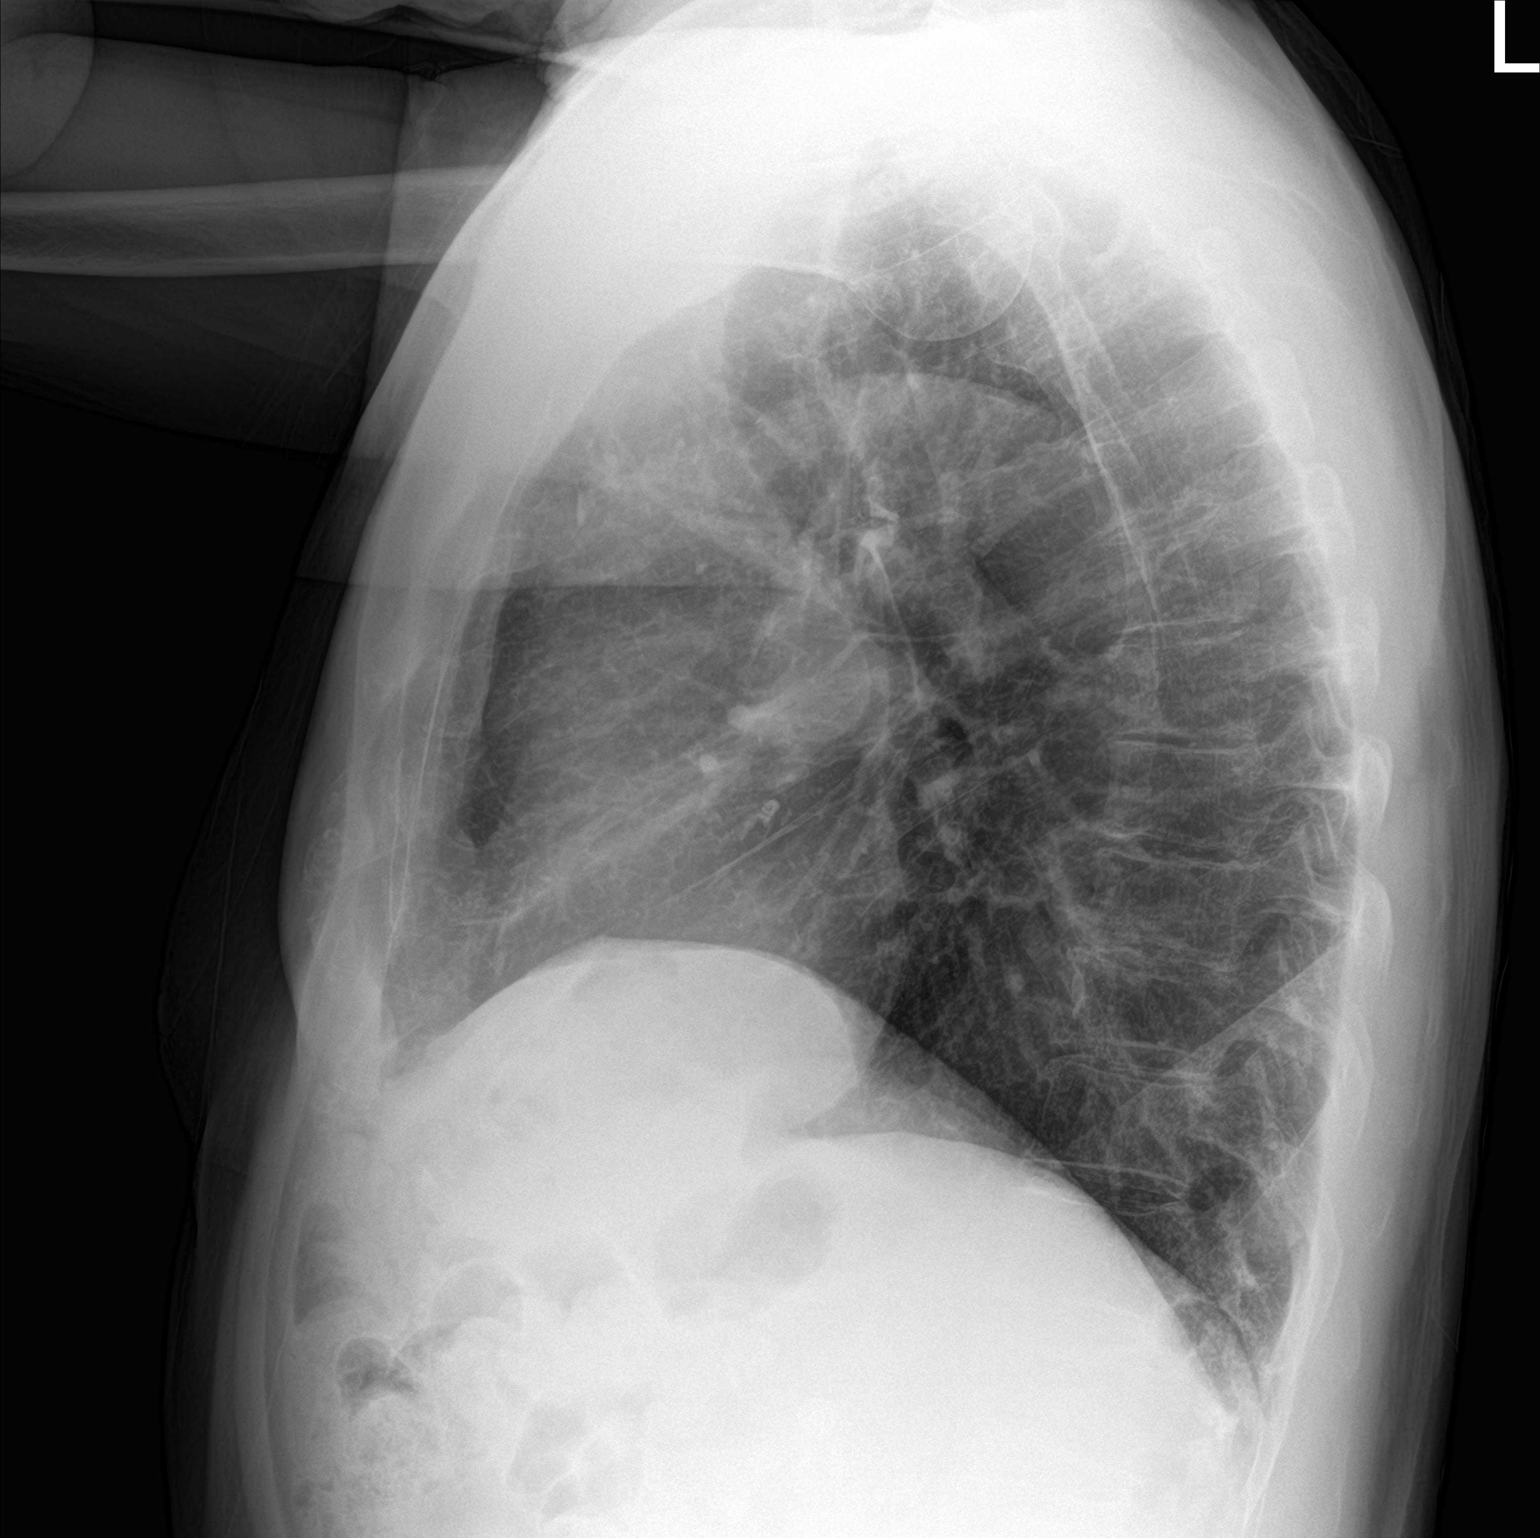

[2 of 2 positions shown; findings below may reference images not displayed]

FINDINGS: Frontal and lateral views of the chest demonstrate an unremarkable
cardiac silhouette. No airspace disease, effusion, or pneumothorax.
No acute bony abnormalities.
IMPRESSION: 1. No acute intrathoracic process.

## 2023-04-27 ENCOUNTER — Ambulatory Visit (INDEPENDENT_AMBULATORY_CARE_PROVIDER_SITE_OTHER): Payer: Medicare Other | Admitting: Dermatology

## 2023-04-27 VITALS — BP 133/72 | HR 63

## 2023-04-27 DIAGNOSIS — L821 Other seborrheic keratosis: Secondary | ICD-10-CM

## 2023-04-27 DIAGNOSIS — Z872 Personal history of diseases of the skin and subcutaneous tissue: Secondary | ICD-10-CM

## 2023-04-27 DIAGNOSIS — Z85828 Personal history of other malignant neoplasm of skin: Secondary | ICD-10-CM

## 2023-04-27 DIAGNOSIS — I781 Nevus, non-neoplastic: Secondary | ICD-10-CM

## 2023-04-27 DIAGNOSIS — L578 Other skin changes due to chronic exposure to nonionizing radiation: Secondary | ICD-10-CM

## 2023-04-27 DIAGNOSIS — L820 Inflamed seborrheic keratosis: Secondary | ICD-10-CM | POA: Diagnosis not present

## 2023-04-27 DIAGNOSIS — L814 Other melanin hyperpigmentation: Secondary | ICD-10-CM

## 2023-04-27 DIAGNOSIS — W908XXA Exposure to other nonionizing radiation, initial encounter: Secondary | ICD-10-CM | POA: Diagnosis not present

## 2023-04-27 DIAGNOSIS — L570 Actinic keratosis: Secondary | ICD-10-CM

## 2023-04-27 NOTE — Patient Instructions (Addendum)
 Cryotherapy Aftercare  Wash gently with soap and water everyday.   Apply Vaseline and Band-Aid daily until healed.     Due to recent changes in healthcare laws, you may see results of your pathology and/or laboratory studies on MyChart before the doctors have had a chance to review them. We understand that in some cases there may be results that are confusing or concerning to you. Please understand that not all results are received at the same time and often the doctors may need to interpret multiple results in order to provide you with the best plan of care or course of treatment. Therefore, we ask that you please give us 2 business days to thoroughly review all your results before contacting the office for clarification. Should we see a critical lab result, you will be contacted sooner.   If You Need Anything After Your Visit  If you have any questions or concerns for your doctor, please call our main line at [phone] and press option 4 to reach your doctor's medical assistant. If no one answers, please leave a voicemail as directed and we will return your call as soon as possible. Messages left after 4 pm will be answered the following business day.   You may also send us a message via MyChart. We typically respond to MyChart messages within 1-2 business days.  For prescription refills, please ask your pharmacy to contact our office. Our fax number is [phone].  If you have an urgent issue when the clinic is closed that cannot wait until the next business day, you can page your doctor at the number below.    Please note that while we do our best to be available for urgent issues outside of office hours, we are not available 24/7.   If you have an urgent issue and are unable to reach us, you may choose to seek medical care at your doctor's office, retail clinic, urgent care center, or emergency room.  If you have a medical emergency, please immediately call 911 or go to the  emergency department.  Pager Numbers  - Dr. Kowalski: [phone]  - Dr. Moye: [phone]  - Dr. Stewart: [phone]  In the event of inclement weather, please call our main line at [phone] for an update on the status of any delays or closures.  Dermatology Medication Tips: Please keep the boxes that topical medications come in in order to help keep track of the instructions about where and how to use these. Pharmacies typically print the medication instructions only on the boxes and not directly on the medication tubes.   If your medication is too expensive, please contact our office at [phone] option 4 or send us a message through MyChart.   We are unable to tell what your co-pay for medications will be in advance as this is different depending on your insurance coverage. However, we may be able to find a substitute medication at lower cost or fill out paperwork to get insurance to cover a needed medication.   If a prior authorization is required to get your medication covered by your insurance company, please allow us 1-2 business days to complete this process.  Drug prices often vary depending on where the prescription is filled and some pharmacies may offer cheaper prices.  The website www.goodrx.com contains coupons for medications through different pharmacies. The prices here do not account for what the cost may be with help from insurance (it may be cheaper with your insurance), but the website can   give you the price if you did not use any insurance.  - You can print the associated coupon and take it with your prescription to the pharmacy.  - You may also stop by our office during regular business hours and pick up a GoodRx coupon card.  - If you need your prescription sent electronically to a different pharmacy, notify our office through [place] MyChart or by phone at [phone] option 4.     Si Usted Necesita Algo Despus de Su Visita  Tambin puede  enviarnos un mensaje a travs de MyChart. Por lo general respondemos a los mensajes de MyChart en el transcurso de 1 a 2 das hbiles.  Para renovar recetas, por favor pida a su farmacia que se ponga en contacto con nuestra oficina. Nuestro nmero de fax es el [phone].  Si tiene un asunto urgente cuando la clnica est cerrada y que no puede esperar hasta el siguiente da hbil, puede llamar/localizar a su doctor(a) al nmero que aparece a continuacin.   Por favor, tenga en cuenta que aunque hacemos todo lo posible para estar disponibles para asuntos urgentes fuera del horario de oficina, no estamos disponibles las 24 horas del da, los 7 das de la semana.   Si tiene un problema urgente y no puede comunicarse con nosotros, puede optar por buscar atencin mdica  en el consultorio de su doctor(a), en una clnica privada, en un centro de atencin urgente o en una sala de emergencias.  Si tiene una emergencia mdica, por favor llame inmediatamente al 911 o vaya a la sala de emergencias.  Nmeros de bper  - Dr. Kowalski: [phone]  - Dra. Moye: [phone]  - Dra. Stewart: [phone]  En caso de inclemencias del tiempo, por favor llame a nuestra lnea principal al [phone] para una actualizacin sobre el estado de cualquier retraso o cierre.  Consejos para la medicacin en dermatologa: Por favor, guarde las cajas en las que vienen los medicamentos de uso tpico para ayudarle a seguir las instrucciones sobre dnde y cmo usarlos. Las farmacias generalmente imprimen las instrucciones del medicamento slo en las cajas y no directamente en los tubos del medicamento.   Si su medicamento es muy caro, por favor, pngase en contacto con nuestra oficina llamando al [phone] y presione la opcin 4 o envenos un mensaje a travs de MyChart.   No podemos decirle cul ser su copago por los medicamentos por adelantado ya que esto es diferente dependiendo de la cobertura de su seguro.  Sin embargo, es posible que podamos encontrar un medicamento sustituto a menor costo o llenar un formulario para que el seguro cubra el medicamento que se considera necesario.   Si se requiere una autorizacin previa para que su compaa de seguros cubra su medicamento, por favor permtanos de 1 a 2 das hbiles para completar este proceso.  Los precios de los medicamentos varan con frecuencia dependiendo del lugar de dnde se surte la receta y alguna farmacias pueden ofrecer precios ms baratos.  El sitio web www.goodrx.com tiene cupones para medicamentos de diferentes farmacias. Los precios aqu no tienen en cuenta lo que podra costar con la ayuda del seguro (puede ser ms barato con su seguro), pero el sitio web puede darle el precio si no utiliz ningn seguro.  - Puede imprimir el cupn correspondiente y llevarlo con su receta a la farmacia.  - Tambin puede pasar por nuestra oficina durante el horario de atencin regular y recoger una tarjeta de cupones de GoodRx.  -   Si necesita que su receta se enve electrnicamente a una farmacia diferente, informe a nuestra oficina a travs de MyChart de [place] o por telfono llamando al [phone] y presione la opcin 4.  

## 2023-04-27 NOTE — Progress Notes (Signed)
Follow-Up Visit   Subjective  Jamie Byrd is a 89 y.o. male who presents for the following: 3 month follow-up Actinic Keratoses, recheck left postauricular neck. Patient has scaly spots on his hands and arms to check. Some are irritating.  The patient has spots, moles and lesions to be evaluated, some may be new or changing.    The following portions of the chart were reviewed this encounter and updated as appropriate: medications, allergies, medical history  Review of Systems:  No other skin or systemic complaints except as noted in HPI or Assessment and Plan.  Objective  Well appearing patient in no apparent distress; mood and affect are within normal limits.  A focused examination was performed of the following areas: Face, scalp, arms, hands Relevant physical exam findings are noted in the Assessment and Plan.  L preauricular x 1, R postauricular neck x 1, R antihelix x 1, R mid cheek x 1 (4) Pink scaly macules.   Left Upper Arm x 1 Erythematous stuck-on, waxy papule or plaque  R wrist x 3, R post crown x 1 (4) Pink scaly papules.    Assessment & Plan   AK (actinic keratosis) (4) L preauricular x 1, R postauricular neck x 1, R antihelix x 1, R mid cheek x 1  Actinic keratoses are precancerous spots that appear secondary to cumulative UV radiation exposure/sun exposure over time. They are chronic with expected duration over 1 year. A portion of actinic keratoses will progress to squamous cell carcinoma of the skin. It is not possible to reliably predict which spots will progress to skin cancer and so treatment is recommended to prevent development of skin cancer.  Recommend daily broad spectrum sunscreen SPF 30+ to sun-exposed areas, reapply every 2 hours as needed.  Recommend staying in the shade or wearing long sleeves, sun glasses (UVA+UVB protection) and wide brim hats (4-inch brim around the entire circumference of the hat). Call for new or changing  lesions.  Destruction of lesion - L preauricular x 1, R postauricular neck x 1, R antihelix x 1, R mid cheek x 1 (4)  Destruction method: cryotherapy   Informed consent: discussed and consent obtained   Lesion destroyed using liquid nitrogen: Yes   Region frozen until ice ball extended beyond lesion: Yes   Outcome: patient tolerated procedure well with no complications   Post-procedure details: wound care instructions given   Additional details:  Prior to procedure, discussed risks of blister formation, small wound, skin dyspigmentation, or rare scar following cryotherapy. Recommend Vaseline ointment to treated areas while healing.   Inflamed seborrheic keratosis Left Upper Arm x 1  Symptomatic, irritating, patient would like treated.  Destruction of lesion - Left Upper Arm x 1  Destruction method: cryotherapy   Informed consent: discussed and consent obtained   Lesion destroyed using liquid nitrogen: Yes   Region frozen until ice ball extended beyond lesion: Yes   Outcome: patient tolerated procedure well with no complications   Post-procedure details: wound care instructions given   Additional details:  Prior to procedure, discussed risks of blister formation, small wound, skin dyspigmentation, or rare scar following cryotherapy. Recommend Vaseline ointment to treated areas while healing.   Hypertrophic actinic keratosis (4) R wrist x 3, R post crown x 1  Actinic keratoses are precancerous spots that appear secondary to cumulative UV radiation exposure/sun exposure over time. They are chronic with expected duration over 1 year. A portion of actinic keratoses will progress to squamous cell carcinoma  of the skin. It is not possible to reliably predict which spots will progress to skin cancer and so treatment is recommended to prevent development of skin cancer.  Recommend daily broad spectrum sunscreen SPF 30+ to sun-exposed areas, reapply every 2 hours as needed.  Recommend  staying in the shade or wearing long sleeves, sun glasses (UVA+UVB protection) and wide brim hats (4-inch brim around the entire circumference of the hat). Call for new or changing lesions.  Destruction of lesion - R wrist x 3, R post crown x 1 (4)  Destruction method: cryotherapy   Informed consent: discussed and consent obtained   Lesion destroyed using liquid nitrogen: Yes   Region frozen until ice ball extended beyond lesion: Yes   Outcome: patient tolerated procedure well with no complications   Post-procedure details: wound care instructions given   Additional details:  Prior to procedure, discussed risks of blister formation, small wound, skin dyspigmentation, or rare scar following cryotherapy. Recommend Vaseline ointment to treated areas while healing.   HISTORY OF PRECANCEROUS ACTINIC KERATOSIS - site(s) of PreCancerous Actinic Keratosis clear today at Left postauricular neck and nose - these may recur and new lesions may form requiring treatment to prevent transformation into skin cancer - observe for new or changing spots and contact [facility] for appointment if occur - photoprotection with sun protective clothing; sunglasses and broad spectrum sunscreen with SPF of at least 30 + and frequent self skin exams recommended - yearly exams by a dermatologist recommended for persons with history of PreCancerous Actinic Keratoses   ACTINIC DAMAGE - chronic, secondary to cumulative UV radiation exposure/sun exposure over time - diffuse scaly erythematous macules with underlying dyspigmentation - Recommend daily broad spectrum sunscreen SPF 30+ to sun-exposed areas, reapply every 2 hours as needed.  - Recommend staying in the shade or wearing long sleeves, sun glasses (UVA+UVB protection) and wide brim hats (4-inch brim around the entire circumference of the hat). - Call for new or changing lesions.  HISTORY OF SQUAMOUS CELL CARCINOMA OF THE SKIN - No evidence of recurrence  today  - Recommend regular full body skin exams - Recommend daily broad spectrum sunscreen SPF 30+ to sun-exposed areas, reapply every 2 hours as needed.  - Call if any new or changing lesions are noted between office visits  HISTORY OF BASAL CELL CARCINOMA OF THE SKIN - No evidence of recurrence today - Recommend regular full body skin exams - Recommend daily broad spectrum sunscreen SPF 30+ to sun-exposed areas, reapply every 2 hours as needed.  - Call if any new or changing lesions are noted between office visits  SEBORRHEIC KERATOSIS - Stuck-on, waxy, tan-brown papules and/or plaques  - Benign-appearing - Discussed benign etiology and prognosis. - Observe - Call for any changes  LENTIGINES Exam: scattered tan macules Due to sun exposure Treatment Plan: Benign-appearing, observe. Recommend daily broad spectrum sunscreen SPF 30+ to sun-exposed areas, reapply every 2 hours as needed.  Call for any changes  Telangiectasia Left zygoma - Dilated blood vessel - Benign appearing on exam - Call for changes  Return in about 6 months (around 10/28/2023) for AKs, Hx BCC, Hx SCC, UBSE.  ICherlyn Labella, CMA, am acting as scribe for Willeen Niece, MD .   Documentation: I have reviewed the above documentation for accuracy and completeness, and I agree with the above.  Willeen Niece, MD

## 2023-05-18 ENCOUNTER — Other Ambulatory Visit: Payer: Self-pay | Admitting: Family Medicine

## 2023-05-22 ENCOUNTER — Other Ambulatory Visit: Payer: Medicare Other

## 2023-05-29 ENCOUNTER — Encounter: Payer: Self-pay | Admitting: Family Medicine

## 2023-05-29 ENCOUNTER — Ambulatory Visit: Payer: Medicare Other | Admitting: Family Medicine

## 2023-05-29 VITALS — BP 122/70 | HR 60 | Temp 97.8°F | Ht 71.0 in | Wt 196.2 lb

## 2023-05-29 DIAGNOSIS — D649 Anemia, unspecified: Secondary | ICD-10-CM | POA: Diagnosis not present

## 2023-05-29 DIAGNOSIS — H353130 Nonexudative age-related macular degeneration, bilateral, stage unspecified: Secondary | ICD-10-CM

## 2023-05-29 DIAGNOSIS — I2510 Atherosclerotic heart disease of native coronary artery without angina pectoris: Secondary | ICD-10-CM

## 2023-05-29 DIAGNOSIS — E039 Hypothyroidism, unspecified: Secondary | ICD-10-CM | POA: Diagnosis not present

## 2023-05-29 DIAGNOSIS — D696 Thrombocytopenia, unspecified: Secondary | ICD-10-CM

## 2023-05-29 DIAGNOSIS — D7589 Other specified diseases of blood and blood-forming organs: Secondary | ICD-10-CM

## 2023-05-29 DIAGNOSIS — N400 Enlarged prostate without lower urinary tract symptoms: Secondary | ICD-10-CM

## 2023-05-29 DIAGNOSIS — E786 Lipoprotein deficiency: Secondary | ICD-10-CM | POA: Diagnosis not present

## 2023-05-29 DIAGNOSIS — M1A9XX0 Chronic gout, unspecified, without tophus (tophi): Secondary | ICD-10-CM | POA: Diagnosis not present

## 2023-05-29 DIAGNOSIS — R7303 Prediabetes: Secondary | ICD-10-CM | POA: Diagnosis not present

## 2023-05-29 DIAGNOSIS — I10 Essential (primary) hypertension: Secondary | ICD-10-CM | POA: Diagnosis not present

## 2023-05-29 DIAGNOSIS — R7989 Other specified abnormal findings of blood chemistry: Secondary | ICD-10-CM | POA: Diagnosis not present

## 2023-05-29 DIAGNOSIS — E871 Hypo-osmolality and hyponatremia: Secondary | ICD-10-CM | POA: Diagnosis not present

## 2023-05-29 DIAGNOSIS — R3129 Other microscopic hematuria: Secondary | ICD-10-CM

## 2023-05-29 LAB — CBC WITH DIFFERENTIAL/PLATELET
Basophils Absolute: 0 10*3/uL (ref 0.0–0.1)
Basophils Relative: 0.7 % (ref 0.0–3.0)
Eosinophils Absolute: 0.4 10*3/uL (ref 0.0–0.7)
Eosinophils Relative: 7.4 % — ABNORMAL HIGH (ref 0.0–5.0)
HCT: 39.2 % (ref 39.0–52.0)
Hemoglobin: 12.8 g/dL — ABNORMAL LOW (ref 13.0–17.0)
Lymphocytes Relative: 35.6 % (ref 12.0–46.0)
Lymphs Abs: 2 10*3/uL (ref 0.7–4.0)
MCHC: 32.6 g/dL (ref 30.0–36.0)
MCV: 101.2 fl — ABNORMAL HIGH (ref 78.0–100.0)
Monocytes Absolute: 0.4 10*3/uL (ref 0.1–1.0)
Monocytes Relative: 8 % (ref 3.0–12.0)
Neutro Abs: 2.7 10*3/uL (ref 1.4–7.7)
Neutrophils Relative %: 48.3 % (ref 43.0–77.0)
Platelets: 132 10*3/uL — ABNORMAL LOW (ref 150.0–400.0)
RBC: 3.87 Mil/uL — ABNORMAL LOW (ref 4.22–5.81)
RDW: 14.4 % (ref 11.5–15.5)
WBC: 5.6 10*3/uL (ref 4.0–10.5)

## 2023-05-29 LAB — COMPREHENSIVE METABOLIC PANEL
ALT: 9 U/L (ref 0–53)
AST: 24 U/L (ref 0–37)
Albumin: 4.5 g/dL (ref 3.5–5.2)
Alkaline Phosphatase: 102 U/L (ref 39–117)
BUN: 13 mg/dL (ref 6–23)
CO2: 28 mEq/L (ref 19–32)
Calcium: 9.9 mg/dL (ref 8.4–10.5)
Chloride: 99 mEq/L (ref 96–112)
Creatinine, Ser: 0.93 mg/dL (ref 0.40–1.50)
GFR: 74.25 mL/min (ref >60.00)
Glucose, Bld: 86 mg/dL (ref 70–99)
Potassium: 4.2 mEq/L (ref 3.5–5.1)
Sodium: 132 mEq/L — ABNORMAL LOW (ref 135–145)
Total Bilirubin: 1.3 mg/dL — ABNORMAL HIGH (ref 0.2–1.2)
Total Protein: 6.7 g/dL (ref 6.0–8.3)

## 2023-05-29 LAB — URIC ACID: Uric Acid, Serum: 3.9 mg/dL — ABNORMAL LOW (ref 4.0–7.8)

## 2023-05-29 LAB — LIPID PANEL
Cholesterol: 73 mg/dL (ref 0–200)
HDL: 27.4 mg/dL — ABNORMAL LOW (ref >39.00)
LDL Cholesterol: 32 mg/dL (ref 0–99)
NonHDL: 45.18
Total CHOL/HDL Ratio: 3
Triglycerides: 68 mg/dL (ref 0.0–149.0)
VLDL: 13.6 mg/dL (ref 0.0–40.0)

## 2023-05-29 LAB — FERRITIN: Ferritin: 345.8 ng/mL — ABNORMAL HIGH (ref 22.0–322.0)

## 2023-05-29 LAB — TSH: TSH: 3.14 u[IU]/mL (ref 0.35–5.50)

## 2023-05-29 LAB — IRON: Iron: 110 ug/dL (ref 42–165)

## 2023-05-29 NOTE — Patient Instructions (Addendum)
Get a flu shot in the fall   You are due for a tetanus booster vaccine  You can ask your pharmacist for that   I recommend vitamin D3 for bone health  Over the counter get 2000 international units and take one daily   Labs today   Stay active Add some strength training to your routine, this is important for bone and brain health and can reduce your risk of falls and help your body use insulin properly and regulate weight  Light weights, exercise bands , and internet videos are a good way to start  Yoga (chair or regular), machines , floor exercises or a gym with machines are also good options    Take care of yourself

## 2023-05-29 NOTE — Progress Notes (Unsigned)
Subjective:    Patient ID: Jamie Byrd, male    DOB: 04/13/1934, 89 y.o.   MRN: [id number]  HPI Pt presents for annual follow up of chronic medical problems   Wt Readings from Last 3 Encounters:  05/29/23 196 lb 4 oz (89 kg)  03/11/23 203 lb 12.8 oz (92.4 kg)  02/16/23 200 lb (90.7 kg)   27.37 kg/m  Vitals:   05/29/23 0840  BP: 122/70  Pulse: 60  Temp: 97.8 F (36.6 C)  SpO2: 95%    Immunization History  Administered Date(s) Administered   Fluad Quad(high Dose 65+) 07/15/2019, 07/19/2020, 10/03/2022   Influenza Split 09/17/2011, 07/23/2012   Influenza Whole 08/25/2006, 09/10/2009, 09/25/2010   Influenza, High Dose Seasonal PF 07/29/2018   Influenza, Seasonal, Injecte, Preservative Fre 06/28/2015   Influenza,inj,Quad PF,6+ Mos 07/07/2013, 07/19/2014, 08/08/2016, 07/16/2017   Influenza-Unspecified 06/28/2015   PFIZER(Purple Top)SARS-COV-2 Vaccination 12/06/2019, 12/27/2019   Pneumococcal Conjugate-13 12/11/2014   Pneumococcal Polysaccharide-23 09/17/2011   Td 07/14/1999, 09/10/2009    Health Maintenance Due  Topic Date Due   Zoster Vaccines- Shingrix (1 of 2) Never done   DTaP/Tdap/Td (3 - Tdap) 09/11/2019   Feeling fairly good   Tetanus shot - may bet at pharmacy   Shingrix - declines   Flu shot - will get in the fall    Prostate health BPH Lab Results  Component Value Date   PSA 0.59 12/18/2017   PSA 0.56 12/17/2016   PSA 0.48 12/13/2015  No longer checking psa  No change in urinary freq or stream  Nocturia - twice/ not too bothersome   Colon cancer screening-out aged  No bowel changes   Sees urology  Had a good check 3 mo ago and goes yearly  Bone health   Falls-none  Fractures- none  Supplements -none except eye vitamin  Exercise -lots of outdoor work / house work / gardening  Does not sit much at all   Nutrition - fairly balanced / lot of salads and sandwiches  Has less appetite -has lost some weight   Sees eye doctor   Sees  dermatology regularly A lot of AKS  Has a new spot on left arm   Mood    05/29/2023    8:43 AM 02/16/2023    8:10 AM 05/26/2022    9:34 AM 02/12/2022    8:32 AM 05/23/2021    9:20 AM  Depression screen PHQ 2/9  Decreased Interest 0 0 0 0 0  Down, Depressed, Hopeless 0 0 0 0 0  PHQ - 2 Score 0 0 0 0 0  Altered sleeping 0      Tired, decreased energy 0      Change in appetite 0      Feeling bad or failure about yourself  0      Trouble concentrating 0      Moving slowly or fidgety/restless 0      Suicidal thoughts 0      PHQ-9 Score 0      Difficult doing work/chores Not difficult at all        bp is stable today  No cp or palpitations or headaches or edema  No side effects to medicines  BP Readings from Last 3 Encounters:  05/29/23 122/70  04/27/23 133/72  03/11/23 129/74    Lisinopril 10 mg daily  Metoprolol xl 12.5 mg daily   CAD- cardiology  On asa  No longer on plavix   Pulse Readings from Last 3 Encounters:  05/29/23 60  04/27/23 63  03/11/23 64    Eyes /vision are worse  No longer drives     Hypothyroidism  Pt has no clinical changes No change in energy level/ hair or skin/ edema and no tremor Lab Results  Component Value Date   TSH 3.72 08/05/2022    Levothyroxine 88 mcg     Lab Results  Component Value Date   CHOL 77 08/05/2022   HDL 30.10 (L) 08/05/2022   LDLCALC 28 08/05/2022   TRIG 92.0 08/05/2022   CHOLHDL 3 08/05/2022   De for labs  Crestor 20 mg daily   No gout flares     Patient Active Problem List   Diagnosis Date Noted   Anemia 08/25/2022   Elevated ferritin 08/25/2022   Enlarged prostate 08/08/2022   Bladder wall thickening 08/08/2022   Microscopic hematuria 07/07/2022   Urgency of urination 07/07/2022   Flank pain 07/07/2022   CAD (coronary artery disease) 05/26/2022   Constipation 05/26/2022   Thrombocytopenia (HCC) 05/26/2022   Low back pain 05/21/2018   Macular degeneration 12/18/2017   Hearing loss of aging  12/09/2013   Encounter for Medicare annual wellness exam 12/09/2013   Personal history of colonic polyps 12/08/2012   Prostate cancer screening 09/10/2011   Hyperkalemia 09/25/2010   Prediabetes 09/25/2010   Low HDL (under 40) 11/05/2009   Hypothyroidism 07/28/2007   Gout 07/28/2007   ERECTILE DYSFUNCTION 07/28/2007   Primary hypertension 07/28/2007   Benign prostatic hyperplasia 07/28/2007   Past Medical History:  Diagnosis Date   Actinic keratosis 10/28/2022   upper nasal dorsum, EDC   BCC (basal cell carcinoma) 09/12/2021   right neck posterior EDC 11/21/2021   BCC (basal cell carcinoma) 09/12/2021   right temple, EDC 02/18/2022   BCC (basal cell carcinoma) 09/12/2021   left temple, Mohs 12/18/21   BCC (basal cell carcinoma) 09/12/2021   left zygoma, Clear with biopsy 02/18/2022   BCC (basal cell carcinoma) 10/24/2021   right nasolabial fold, MOHs 01/01/2022   BPH (benign prostatic hypertrophy)    Difficulty reading    Difficulty seeing    Difficulty in seeing certain things and reading/ not legally blind!   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   right neck anterior, well differentiated, base involved EDC 11/21/2021   Squamous cell carcinoma of skin 11/21/2021   right dorsal hand, EDC   Squamous cell carcinoma of skin 04/21/2022   R med cheek, Atypical Squamous Proliferation w/Cystic Change, exc 06/09/2022  Mohs surgery for a recurrent moderately differentiated SCC R med cheek 07/23/22   Past Surgical History:  Procedure Laterality Date   COLONOSCOPY     CORONARY ATHERECTOMY N/A 09/26/2021   Procedure: CORONARY ATHERECTOMY;  Surgeon: Elder Negus, MD;  Location: [facility];  Service: Cardiovascular;  Laterality: N/A;   CORONARY IMAGING/OCT N/A 09/26/2021   Procedure: INTRAVASCULAR IMAGING/OCT;  Surgeon: Elder Negus, MD;  Location: [facility]  INVAS[facility] CV [facility];  Service: Cardiovascular;  Laterality: N/A;   CORONARY STENT INTERVENTION N/A 09/26/2021   Procedure: CORONARY STENT INTERVENTION;  Surgeon: Elder Negus, MD;  Location: [facility];  Service: Cardiovascular;  Laterality: N/A;   LEFT HEART CATH AND CORONARY ANGIOGRAPHY N/A 09/25/2021   Procedure: LEFT HEART CATH AND CORONARY ANGIOGRAPHY;  Surgeon: Elder Negus, MD;  Location: [facility];  Service: Cardiovascular;  Laterality: N/A;   POLYPECTOMY     Social History   Tobacco Use   Smoking status: Former    Current packs/day: 0.00    Average packs/day: 1.5 packs/day for 15.0 years (22.5 ttl pk-yrs)    Types: Cigarettes    Start date: 10/13/1953    Quit date: 10/13/1968    Years since quitting: 54.6   Smokeless tobacco: Never  Vaping Use   Vaping status: Never Used  Substance Use Topics   Alcohol use: No    Comment: no alcohol in 20 years   Drug use: No   Family History  Problem Relation Age of Onset   Heart disease Mother        CABG   Heart disease Father        CAD   Cancer Father        lung CA   Cancer Brother        lung CA   No Known Allergies Current Outpatient Medications on File Prior to Visit  Medication Sig Dispense Refill   allopurinol (ZYLOPRIM) 100 MG tablet TAKE 1 TABLET BY MOUTH ONCE A DAY 90 tablet 3   aspirin 81 MG EC tablet Take 1 tablet (81 mg total) by mouth daily. Swallow whole. 30 tablet 2   levothyroxine (SYNTHROID) 88 MCG tablet Take 1 tablet (88 mcg total) by mouth daily before breakfast. 90 tablet 3   lisinopril (ZESTRIL) 10 MG tablet Take 1 tablet (10 mg total) by mouth daily. 90 tablet 1   metoprolol succinate (TOPROL-XL) 25 MG 24 hr tablet Take 0.5 tablets (12.5 mg total) by mouth daily. Take with or immediately following a meal. 45 tablet 3   nitroGLYCERIN (NITROSTAT) 0.4 MG SL tablet Place 1 tablet (0.4 mg total) under the tongue every 5 (five) minutes as needed for chest pain. 30 tablet 2    rosuvastatin (CRESTOR) 20 MG tablet TAKE ONE TABLET BY MOUTH ONCE A DAY 90 tablet 0   No current facility-administered medications on file prior to visit.    Review of Systems     Objective:   Physical Exam        Assessment & Plan:   Problem List Items Addressed This Visit   None

## 2023-05-31 ENCOUNTER — Encounter: Payer: Self-pay | Admitting: Family Medicine

## 2023-05-31 NOTE — Assessment & Plan Note (Signed)
Cbc today  No clinical changes (bleeding or bruising) noted

## 2023-05-31 NOTE — Assessment & Plan Note (Signed)
Continues urology f/u 

## 2023-05-31 NOTE — Assessment & Plan Note (Signed)
No clinical changes Pt does not feel he needs treatment  Sees urologist yearly as well

## 2023-05-31 NOTE — Assessment & Plan Note (Signed)
Hypothyroidism  Pt has no clinical changes No change in energy level/ hair or skin/ edema and no tremor Lab Results  Component Value Date   TSH 3.14 05/29/2023  Plan to continue levothyroxine 88 mcg daily

## 2023-05-31 NOTE — Assessment & Plan Note (Signed)
No longer on plavix  Taking asa alone  Under cardiology care  Crestor 20 mg daily

## 2023-05-31 NOTE — Assessment & Plan Note (Signed)
A1c ordered disc imp of low glycemic diet and wt loss to prevent DM2  

## 2023-05-31 NOTE — Assessment & Plan Note (Signed)
Taking allopurinol 100 mg daily  No recent flares   Uric acid level today

## 2023-05-31 NOTE — Assessment & Plan Note (Signed)
Lab today  Mildly elevated  No clinical symptoms  Does not supplement iron

## 2023-05-31 NOTE — Assessment & Plan Note (Signed)
In setting of chronic microscopic hematuria /sees urology  No new symptoms  Repeat cbc and iron labs today Colonoscopy 2014 with neg bx   Has out aged screening

## 2023-05-31 NOTE — Assessment & Plan Note (Signed)
Vision is impaired  Ongoing oph care  No longer driving

## 2023-05-31 NOTE — Assessment & Plan Note (Signed)
bp in fair control at this time  BP Readings from Last 1 Encounters:  05/29/23 122/70   No changes needed Most recent labs reviewed  Disc lifstyle change with low sodium diet and exercise  Plan to continue Lisinopril 10 mg daily  Metoprolol xl 12.5 mg daily

## 2023-05-31 NOTE — Assessment & Plan Note (Addendum)
Ongoing  Disc goals for lipids and reasons to control them Rev last labs with pt Rev low sat fat diet in detail Continues crestor 20 mg daily in setting of CAD  Recommend exercise as tolerated Lab today

## 2023-06-01 DIAGNOSIS — E871 Hypo-osmolality and hyponatremia: Secondary | ICD-10-CM | POA: Insufficient documentation

## 2023-06-01 DIAGNOSIS — D7589 Other specified diseases of blood and blood-forming organs: Secondary | ICD-10-CM | POA: Insufficient documentation

## 2023-06-01 NOTE — Addendum Note (Signed)
Addended by: Roxy Manns A on: 06/01/2023 08:19 PM   Modules accepted: Orders

## 2023-06-05 ENCOUNTER — Other Ambulatory Visit (INDEPENDENT_AMBULATORY_CARE_PROVIDER_SITE_OTHER): Payer: Medicare Other

## 2023-06-05 DIAGNOSIS — D7589 Other specified diseases of blood and blood-forming organs: Secondary | ICD-10-CM

## 2023-06-05 DIAGNOSIS — D696 Thrombocytopenia, unspecified: Secondary | ICD-10-CM

## 2023-06-05 DIAGNOSIS — I10 Essential (primary) hypertension: Secondary | ICD-10-CM | POA: Diagnosis not present

## 2023-06-05 DIAGNOSIS — E871 Hypo-osmolality and hyponatremia: Secondary | ICD-10-CM | POA: Diagnosis not present

## 2023-06-05 LAB — BASIC METABOLIC PANEL
BUN: 14 mg/dL (ref 6–23)
CO2: 29 mEq/L (ref 19–32)
Calcium: 9.8 mg/dL (ref 8.4–10.5)
Chloride: 102 mEq/L (ref 96–112)
Creatinine, Ser: 0.97 mg/dL (ref 0.40–1.50)
GFR: 70.58 mL/min (ref >60.00)
Glucose, Bld: 102 mg/dL — ABNORMAL HIGH (ref 70–99)
Potassium: 5.1 mEq/L (ref 3.5–5.1)
Sodium: 137 mEq/L (ref 135–145)

## 2023-06-05 LAB — VITAMIN B12: Vitamin B-12: 125 pg/mL — ABNORMAL LOW (ref 211–911)

## 2023-06-05 NOTE — Addendum Note (Signed)
Addended by: Alvina Chou on: 06/05/2023 08:24 AM   Modules accepted: Orders

## 2023-06-08 LAB — CBC WITH DIFFERENTIAL/PLATELET
Absolute Monocytes: 389 {cells}/uL (ref 200–950)
Basophils Absolute: 52 {cells}/uL (ref 0–200)
Basophils Relative: 0.9 %
Eosinophils Absolute: 371 {cells}/uL (ref 15–500)
Eosinophils Relative: 6.4 %
HCT: 39 % (ref 38.5–50.0)
Hemoglobin: 12.8 g/dL — ABNORMAL LOW (ref 13.2–17.1)
Lymphs Abs: 2337 {cells}/uL (ref 850–3900)
MCH: 32.4 pg (ref 27.0–33.0)
MCHC: 32.8 g/dL (ref 32.0–36.0)
MCV: 98.7 fL (ref 80.0–100.0)
MPV: 11.4 fL (ref 7.5–12.5)
Monocytes Relative: 6.7 %
Neutro Abs: 2651 {cells}/uL (ref 1500–7800)
Neutrophils Relative %: 45.7 %
Platelets: 131 10*3/uL — ABNORMAL LOW (ref 140–400)
RBC: 3.95 10*6/uL — ABNORMAL LOW (ref 4.20–5.80)
RDW: 12.7 % (ref 11.0–15.0)
Total Lymphocyte: 40.3 %
WBC: 5.8 10*3/uL (ref 3.8–10.8)

## 2023-06-08 LAB — PATHOLOGIST SMEAR REVIEW

## 2023-06-09 DIAGNOSIS — H43813 Vitreous degeneration, bilateral: Secondary | ICD-10-CM | POA: Diagnosis not present

## 2023-06-09 DIAGNOSIS — H353134 Nonexudative age-related macular degeneration, bilateral, advanced atrophic with subfoveal involvement: Secondary | ICD-10-CM | POA: Diagnosis not present

## 2023-06-09 DIAGNOSIS — H35423 Microcystoid degeneration of retina, bilateral: Secondary | ICD-10-CM | POA: Diagnosis not present

## 2023-06-09 DIAGNOSIS — H31092 Other chorioretinal scars, left eye: Secondary | ICD-10-CM | POA: Diagnosis not present

## 2023-06-09 DIAGNOSIS — H35433 Paving stone degeneration of retina, bilateral: Secondary | ICD-10-CM | POA: Diagnosis not present

## 2023-06-09 DIAGNOSIS — Z961 Presence of intraocular lens: Secondary | ICD-10-CM | POA: Diagnosis not present

## 2023-06-09 DIAGNOSIS — H35033 Hypertensive retinopathy, bilateral: Secondary | ICD-10-CM | POA: Diagnosis not present

## 2023-06-12 ENCOUNTER — Ambulatory Visit: Payer: Medicare Other | Admitting: Family Medicine

## 2023-06-12 ENCOUNTER — Encounter: Payer: Self-pay | Admitting: Family Medicine

## 2023-06-12 VITALS — BP 130/70 | HR 58 | Temp 97.9°F | Ht 71.0 in | Wt 199.1 lb

## 2023-06-12 DIAGNOSIS — E871 Hypo-osmolality and hyponatremia: Secondary | ICD-10-CM | POA: Diagnosis not present

## 2023-06-12 DIAGNOSIS — R7989 Other specified abnormal findings of blood chemistry: Secondary | ICD-10-CM | POA: Diagnosis not present

## 2023-06-12 DIAGNOSIS — D649 Anemia, unspecified: Secondary | ICD-10-CM

## 2023-06-12 DIAGNOSIS — E538 Deficiency of other specified B group vitamins: Secondary | ICD-10-CM | POA: Diagnosis not present

## 2023-06-12 DIAGNOSIS — D696 Thrombocytopenia, unspecified: Secondary | ICD-10-CM

## 2023-06-12 DIAGNOSIS — D7589 Other specified diseases of blood and blood-forming organs: Secondary | ICD-10-CM

## 2023-06-12 MED ORDER — CYANOCOBALAMIN 1000 MCG/ML IJ SOLN
1000.0000 ug | Freq: Once | INTRAMUSCULAR | Status: AC
Start: 2023-06-12 — End: 2023-06-12
  Administered 2023-06-12: 1000 ug via INTRAMUSCULAR

## 2023-06-12 NOTE — Assessment & Plan Note (Signed)
This was brief and normalized on re check  Pt was eating low na diet and drinking more water  Now normal at 137  No symptoms

## 2023-06-12 NOTE — Assessment & Plan Note (Signed)
This has been up and down  Lab Results  Component Value Date   IRON 110 05/29/2023   TIBC 266.0 08/06/2022   FERRITIN 345.8 (H) 05/29/2023   No symptoms B12 is low  ? If reactive Reviewed path review of cbc for this and low platelet Re check after B12 supplementation in 2 mo

## 2023-06-12 NOTE — Progress Notes (Signed)
Subjective:    Patient ID: Jamie Byrd, male    DOB: [birth year], 89 y.o.   MRN: [id number]  HPI  Wt Readings from Last 3 Encounters:  06/12/23 199 lb 2 oz (90.3 kg)  05/29/23 196 lb 4 oz (89 kg)  03/11/23 203 lb 12.8 oz (92.4 kg)   27.77 kg/m  Vitals:   06/12/23 1025  BP: 130/70  Pulse: (!) 58  Temp: 97.9 F (36.6 C)  SpO2: 98%    Pt presents for follow up of B12 deficiency   Lab Results  Component Value Date   VITAMINB12 125 (L) 06/05/2023   This is new  Lab Results  Component Value Date   WBC 5.8 06/05/2023   HGB 12.8 (L) 06/05/2023   HCT 39.0 06/05/2023   MCV 98.7 06/05/2023   PLT 131 (L) 06/05/2023   Hb down slightly  MCV was higher - at 101.2 prior   Lab Results  Component Value Date   IRON 110 05/29/2023   TIBC 266.0 08/06/2022   FERRITIN 345.8 (H) 05/29/2023   Ferritin was up from 294 in December-watching   Low platelet count is stable   Path review Comment: Myeloid population consists predominantly of mature segmented neutrophils. There is a suggestion of hypersegmentation in rare neutrophils. Evaluation of folate and vitamin B12 may be of use. Normocytic anemia with slight polychromasia. Thrombocytopenia. No platelet clumps identified.   Also had low sodium level on 8/16 of 132- was trying to elim sodium from diet  Repeat was normal  Lab Results  Component Value Date   NA 137 06/05/2023   K 5.1 06/05/2023   CO2 29 06/05/2023   GLUCOSE 102 (H) 06/05/2023   BUN 14 06/05/2023   CREATININE 0.97 06/05/2023   CALCIUM 9.8 06/05/2023   GFR 70.58 06/05/2023   GFRNONAA >60 09/27/2021   No GI complaints No GERD or heartburn   Not fatigued  No tingling  Memory is good/no cognitive concerns     Patient Active Problem List   Diagnosis Date Noted   Vitamin B12 deficiency 06/12/2023   Macrocytosis 06/01/2023   Hyponatremia 06/01/2023   Anemia 08/25/2022   Elevated ferritin 08/25/2022   Enlarged prostate 08/08/2022   Bladder  wall thickening 08/08/2022   Microscopic hematuria 07/07/2022   Urgency of urination 07/07/2022   Flank pain 07/07/2022   CAD (coronary artery disease) 05/26/2022   Constipation 05/26/2022   Thrombocytopenia (HCC) 05/26/2022   Low back pain 05/21/2018   Macular degeneration 12/18/2017   Hearing loss of aging 12/09/2013   Encounter for Medicare annual wellness exam 12/09/2013   Personal history of colonic polyps 12/08/2012   Prostate cancer screening 09/10/2011   Prediabetes 09/25/2010   Low HDL (under 40) 11/05/2009   Hypothyroidism 07/28/2007   Gout 07/28/2007   ERECTILE DYSFUNCTION 07/28/2007   Primary hypertension 07/28/2007   Benign prostatic hyperplasia 07/28/2007   Past Medical History:  Diagnosis Date   Actinic keratosis 10/28/2022   upper nasal dorsum, EDC   BCC (basal cell carcinoma) 09/12/2021   right neck posterior EDC 11/21/2021   BCC (basal cell carcinoma) 09/12/2021   right temple, EDC 02/18/2022   BCC (basal cell carcinoma) 09/12/2021   left temple, Mohs 12/18/21   BCC (basal cell carcinoma) 09/12/2021   left zygoma, Clear with biopsy 02/18/2022   BCC (basal cell carcinoma) 10/24/2021   right nasolabial fold, MOHs 01/01/2022   BPH (benign prostatic hypertrophy)    Difficulty reading    Difficulty seeing  Difficulty in seeing certain things and reading/ not legally blind!   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   right neck anterior, well differentiated, base involved EDC 11/21/2021   Squamous cell carcinoma of skin 11/21/2021   right dorsal hand, EDC   Squamous cell carcinoma of skin 04/21/2022   R med cheek, Atypical Squamous Proliferation w/Cystic Change, exc 06/09/2022  Mohs surgery for a recurrent moderately differentiated SCC R med cheek 07/23/22   Past Surgical History:  Procedure Laterality Date   COLONOSCOPY     CORONARY ATHERECTOMY N/A  09/26/2021   Procedure: CORONARY ATHERECTOMY;  Surgeon: Elder Negus, MD;  [facility]: [facility];  Service: Cardiovascular;  Laterality: N/A;   CORONARY IMAGING/OCT N/A 09/26/2021   Procedure: INTRAVASCULAR IMAGING/OCT;  Surgeon: Elder Negus, MD;  Location: [facility];  Service: Cardiovascular;  Laterality: N/A;   CORONARY STENT INTERVENTION N/A 09/26/2021   Procedure: CORONARY STENT INTERVENTION;  Surgeon: Elder Negus, MD;  [facility]: [facility];  Service: Cardiovascular;  Laterality: N/A;   LEFT HEART CATH AND CORONARY ANGIOGRAPHY N/A 09/25/2021   Procedure: LEFT HEART CATH AND CORONARY ANGIOGRAPHY;  Surgeon: Elder Negus, MD;  Location: [facility];  Service: Cardiovascular;  Laterality: N/A;   POLYPECTOMY     Social History   Tobacco Use   Smoking status: Former    Current packs/day: 0.00    Average packs/day: 1.5 packs/day for 15.0 years (22.5 ttl pk-yrs)    Types: Cigarettes    Start date: 10/13/1953    Quit date: 10/13/1968    Years since quitting: 54.6   Smokeless tobacco: Never  Vaping Use   Vaping status: Never Used  Substance Use Topics   Alcohol use: No    Comment: no alcohol in 20 years   Drug use: No   Family History  Problem Relation Age of Onset   Heart disease Mother        CABG   Heart disease Father        CAD   Cancer Father        lung CA   Cancer Brother        lung CA   No Known Allergies Current Outpatient Medications on File Prior to Visit  Medication Sig Dispense Refill   allopurinol (ZYLOPRIM) 100 MG tablet TAKE 1 TABLET BY MOUTH ONCE A DAY 90 tablet 3   aspirin 81 MG EC tablet Take 1 tablet (81 mg total) by mouth daily. Swallow whole. 30 tablet 2   levothyroxine (SYNTHROID) 88 MCG tablet Take 1 tablet (88 mcg total) by mouth daily before breakfast. 90 tablet 3   lisinopril (ZESTRIL) 10 MG tablet Take 1 tablet (10 mg total) by mouth daily. 90 tablet 1   metoprolol succinate (TOPROL-XL)  25 MG 24 hr tablet Take 0.5 tablets (12.5 mg total) by mouth daily. Take with or immediately following a meal. 45 tablet 3   nitroGLYCERIN (NITROSTAT) 0.4 MG SL tablet Place 1 tablet (0.4 mg total) under the tongue every 5 (five) minutes as needed for chest pain. 30 tablet 2   rosuvastatin (CRESTOR) 20 MG tablet TAKE ONE TABLET BY MOUTH ONCE A DAY 90 tablet 0   No current facility-administered medications on file prior to visit.    Review of Systems  Constitutional:  Negative for  activity change, appetite change, fatigue, fever and unexpected weight change.  HENT:  Negative for congestion, rhinorrhea, sore throat and trouble swallowing.   Eyes:  Positive for visual disturbance. Negative for pain, redness and itching.  Respiratory:  Negative for cough, chest tightness, shortness of breath and wheezing.   Cardiovascular:  Negative for chest pain and palpitations.  Gastrointestinal:  Negative for abdominal pain, blood in stool, constipation, diarrhea and nausea.  Endocrine: Negative for cold intolerance, heat intolerance, polydipsia and polyuria.  Genitourinary:  Negative for difficulty urinating, dysuria, frequency and urgency.  Musculoskeletal:  Negative for arthralgias, joint swelling and myalgias.  Skin:  Negative for pallor and rash.  Neurological:  Negative for dizziness, tremors, weakness, numbness and headaches.  Hematological:  Negative for adenopathy. Does not bruise/bleed easily.  Psychiatric/Behavioral:  Negative for decreased concentration and dysphoric mood. The patient is not nervous/anxious.        Objective:   Physical Exam Constitutional:      General: He is not in acute distress.    Appearance: Normal appearance. He is normal weight. He is not ill-appearing or diaphoretic.  Eyes:     Comments: Poor vision baseline  Cardiovascular:     Rate and Rhythm: Bradycardia present.  Pulmonary:     Effort: Pulmonary effort is normal. No respiratory distress.  Musculoskeletal:      Right lower leg: No edema.     Left lower leg: No edema.  Skin:    Coloration: Skin is not jaundiced or pale.     Findings: No bruising, erythema or rash.  Neurological:     Mental Status: He is alert.     Cranial Nerves: No cranial nerve deficit.     Sensory: No sensory deficit.  Psychiatric:        Mood and Affect: Mood normal.           Assessment & Plan:   Problem List Items Addressed This Visit       Hematopoietic and Hemostatic   Thrombocytopenia (HCC)   Relevant Orders   CBC with Differential/Platelet   Vitamin B12   Iron   Ferritin     Other   Anemia    Mild  Hb 12.8  Is B12 def and MCV was elevated  Reviewed path review  Will replace B12 (shots and oral) and re check in 2 mo      Relevant Orders   CBC with Differential/Platelet   Iron   Ferritin   Elevated ferritin    This has been up and down  Lab Results  Component Value Date   IRON 110 05/29/2023   TIBC 266.0 08/06/2022   FERRITIN 345.8 (H) 05/29/2023   No symptoms B12 is low  ? If reactive Reviewed path review of cbc for this and low platelet Re check after B12 supplementation in 2 mo      Relevant Orders   Ferritin   Hyponatremia    This was brief and normalized on re check  Pt was eating low na diet and drinking more water  Now normal at 137  No symptoms         Macrocytosis    Due to B12 Reviewed path review with patient   Will supplement B12- IM and oral  Re check 2 mo      Relevant Orders   CBC with Differential/Platelet   Vitamin B12   Vitamin B12 deficiency - Primary    Lab Results  Component Value Date   VITAMINB12 125 (L)  06/05/2023   Not on ppi  No GI symptoms  Not tired and no neuro changes Discussed possible symptoms /problems that B12 def can cause and handout given  Will plan 4 B12 shots (weekly for 4 weeks) Also plans to get 1000 mcg B12 to take daily over the counter  Re check in 2 months and make a plan from there        Relevant Orders    CBC with Differential/Platelet   Vitamin B12

## 2023-06-12 NOTE — Assessment & Plan Note (Signed)
Due to B12 Reviewed path review with patient   Will supplement B12- IM and oral  Re check 2 mo

## 2023-06-12 NOTE — Assessment & Plan Note (Signed)
Mild  Hb 12.8  Is B12 def and MCV was elevated  Reviewed path review  Will replace B12 (shots and oral) and re check in 2 mo

## 2023-06-12 NOTE — Assessment & Plan Note (Signed)
Lab Results  Component Value Date   VITAMINB12 125 (L) 06/05/2023   Not on ppi  No GI symptoms  Not tired and no neuro changes Discussed possible symptoms /problems that B12 def can cause and handout given  Will plan 4 B12 shots (weekly for 4 weeks) Also plans to get 1000 mcg B12 to take daily over the counter  Re check in 2 months and make a plan from there

## 2023-06-12 NOTE — Addendum Note (Signed)
Addended by: Shon Millet on: 06/12/2023 11:00 AM   Modules accepted: Orders

## 2023-06-12 NOTE — Patient Instructions (Signed)
Glad you are feeling well   Take 1000 mcg of vitamin B12 over the counter every day  First of 4 B12 shots today   Schedule 3 more a week apart   Schedule labs for about 2 months   Try to eat a balanced healthy diet

## 2023-06-18 ENCOUNTER — Ambulatory Visit (INDEPENDENT_AMBULATORY_CARE_PROVIDER_SITE_OTHER): Payer: Medicare Other

## 2023-06-18 DIAGNOSIS — E538 Deficiency of other specified B group vitamins: Secondary | ICD-10-CM | POA: Diagnosis not present

## 2023-06-18 MED ORDER — CYANOCOBALAMIN 1000 MCG/ML IJ SOLN
1000.0000 ug | Freq: Once | INTRAMUSCULAR | Status: AC
  Administered 2023-06-18: 1000 ug via INTRAMUSCULAR

## 2023-06-18 NOTE — Progress Notes (Signed)
Per orders of Dr. Roxy Manns, injection of vitamon b 12 given by Lewanda Rife in right deltoid. Patient tolerated injection well. Patient will make appointment for 1 week.

## 2023-06-23 ENCOUNTER — Other Ambulatory Visit: Payer: Self-pay | Admitting: Family Medicine

## 2023-06-23 DIAGNOSIS — H353124 Nonexudative age-related macular degeneration, left eye, advanced atrophic with subfoveal involvement: Secondary | ICD-10-CM | POA: Diagnosis not present

## 2023-06-25 ENCOUNTER — Ambulatory Visit (INDEPENDENT_AMBULATORY_CARE_PROVIDER_SITE_OTHER): Payer: Medicare Other

## 2023-06-25 DIAGNOSIS — E538 Deficiency of other specified B group vitamins: Secondary | ICD-10-CM

## 2023-06-25 MED ORDER — CYANOCOBALAMIN 1000 MCG/ML IJ SOLN
1000.0000 ug | Freq: Once | INTRAMUSCULAR | Status: AC
Start: 2023-06-25 — End: 2023-06-25
  Administered 2023-06-25: 1000 ug via INTRAMUSCULAR

## 2023-06-25 NOTE — Progress Notes (Signed)
Per orders of Dr. Marne Tower, injection of vitamin b 12  given by Rena Isley in left deltoid. Patient tolerated injection well. Patient will make appointment for 1 week.   

## 2023-07-01 ENCOUNTER — Other Ambulatory Visit: Payer: Self-pay | Admitting: Family Medicine

## 2023-07-02 ENCOUNTER — Ambulatory Visit (INDEPENDENT_AMBULATORY_CARE_PROVIDER_SITE_OTHER): Payer: Medicare Other

## 2023-07-02 DIAGNOSIS — E538 Deficiency of other specified B group vitamins: Secondary | ICD-10-CM

## 2023-07-02 MED ORDER — CYANOCOBALAMIN 1000 MCG/ML IJ SOLN
1000.0000 ug | Freq: Once | INTRAMUSCULAR | Status: AC
  Administered 2023-07-02: 1000 ug via INTRAMUSCULAR

## 2023-07-02 NOTE — Progress Notes (Signed)
Per orders of Dr. Roxy Manns, injection of vitamn b 12 given by Lewanda Rife in right deltoid. Patient tolerated injection well. Jamie Byrd

## 2023-07-06 ENCOUNTER — Other Ambulatory Visit: Payer: Self-pay | Admitting: Family Medicine

## 2023-07-21 DIAGNOSIS — Z961 Presence of intraocular lens: Secondary | ICD-10-CM | POA: Diagnosis not present

## 2023-07-21 DIAGNOSIS — H35033 Hypertensive retinopathy, bilateral: Secondary | ICD-10-CM | POA: Diagnosis not present

## 2023-07-21 DIAGNOSIS — H43813 Vitreous degeneration, bilateral: Secondary | ICD-10-CM | POA: Diagnosis not present

## 2023-07-21 DIAGNOSIS — H353114 Nonexudative age-related macular degeneration, right eye, advanced atrophic with subfoveal involvement: Secondary | ICD-10-CM | POA: Diagnosis not present

## 2023-07-21 DIAGNOSIS — H35423 Microcystoid degeneration of retina, bilateral: Secondary | ICD-10-CM | POA: Diagnosis not present

## 2023-07-21 DIAGNOSIS — H31092 Other chorioretinal scars, left eye: Secondary | ICD-10-CM | POA: Diagnosis not present

## 2023-07-21 DIAGNOSIS — H35433 Paving stone degeneration of retina, bilateral: Secondary | ICD-10-CM | POA: Diagnosis not present

## 2023-08-14 ENCOUNTER — Other Ambulatory Visit: Payer: Medicare Other

## 2023-08-14 DIAGNOSIS — R7989 Other specified abnormal findings of blood chemistry: Secondary | ICD-10-CM

## 2023-08-14 DIAGNOSIS — E538 Deficiency of other specified B group vitamins: Secondary | ICD-10-CM | POA: Diagnosis not present

## 2023-08-14 DIAGNOSIS — D649 Anemia, unspecified: Secondary | ICD-10-CM

## 2023-08-14 DIAGNOSIS — D7589 Other specified diseases of blood and blood-forming organs: Secondary | ICD-10-CM | POA: Diagnosis not present

## 2023-08-14 DIAGNOSIS — D696 Thrombocytopenia, unspecified: Secondary | ICD-10-CM | POA: Diagnosis not present

## 2023-08-14 LAB — CBC WITH DIFFERENTIAL/PLATELET
Basophils Absolute: 0 10*3/uL (ref 0.0–0.1)
Basophils Relative: 0.9 % (ref 0.0–3.0)
Eosinophils Absolute: 0.3 10*3/uL (ref 0.0–0.7)
Eosinophils Relative: 5.8 % — ABNORMAL HIGH (ref 0.0–5.0)
HCT: 39.5 % (ref 39.0–52.0)
Hemoglobin: 12.7 g/dL — ABNORMAL LOW (ref 13.0–17.0)
Lymphocytes Relative: 37 % (ref 12.0–46.0)
Lymphs Abs: 2 10*3/uL (ref 0.7–4.0)
MCHC: 32.2 g/dL (ref 30.0–36.0)
MCV: 102.3 fL — ABNORMAL HIGH (ref 78.0–100.0)
Monocytes Absolute: 0.4 10*3/uL (ref 0.1–1.0)
Monocytes Relative: 7.4 % (ref 3.0–12.0)
Neutro Abs: 2.6 10*3/uL (ref 1.4–7.7)
Neutrophils Relative %: 48.9 % (ref 43.0–77.0)
Platelets: 127 10*3/uL — ABNORMAL LOW (ref 150.0–400.0)
RBC: 3.86 Mil/uL — ABNORMAL LOW (ref 4.22–5.81)
RDW: 14.7 % (ref 11.5–15.5)
WBC: 5.4 10*3/uL (ref 4.0–10.5)

## 2023-08-14 LAB — FERRITIN: Ferritin: 269.7 ng/mL (ref 22.0–322.0)

## 2023-08-14 LAB — VITAMIN B12: Vitamin B-12: 303 pg/mL (ref 211–911)

## 2023-08-14 LAB — IRON: Iron: 131 ug/dL (ref 42–165)

## 2023-08-18 DIAGNOSIS — Z961 Presence of intraocular lens: Secondary | ICD-10-CM | POA: Diagnosis not present

## 2023-08-18 DIAGNOSIS — H353134 Nonexudative age-related macular degeneration, bilateral, advanced atrophic with subfoveal involvement: Secondary | ICD-10-CM | POA: Diagnosis not present

## 2023-08-18 DIAGNOSIS — H35033 Hypertensive retinopathy, bilateral: Secondary | ICD-10-CM | POA: Diagnosis not present

## 2023-08-18 DIAGNOSIS — H35433 Paving stone degeneration of retina, bilateral: Secondary | ICD-10-CM | POA: Diagnosis not present

## 2023-08-18 DIAGNOSIS — H35423 Microcystoid degeneration of retina, bilateral: Secondary | ICD-10-CM | POA: Diagnosis not present

## 2023-08-18 DIAGNOSIS — H43813 Vitreous degeneration, bilateral: Secondary | ICD-10-CM | POA: Diagnosis not present

## 2023-08-18 DIAGNOSIS — H31092 Other chorioretinal scars, left eye: Secondary | ICD-10-CM | POA: Diagnosis not present

## 2023-08-19 ENCOUNTER — Other Ambulatory Visit: Payer: Self-pay | Admitting: Family Medicine

## 2023-08-26 ENCOUNTER — Ambulatory Visit: Payer: Medicare Other | Admitting: Urology

## 2023-08-26 ENCOUNTER — Encounter: Payer: Self-pay | Admitting: Urology

## 2023-08-26 VITALS — BP 182/85 | HR 65 | Ht 71.0 in | Wt 201.4 lb

## 2023-08-26 DIAGNOSIS — M545 Low back pain, unspecified: Secondary | ICD-10-CM | POA: Diagnosis not present

## 2023-08-26 DIAGNOSIS — N401 Enlarged prostate with lower urinary tract symptoms: Secondary | ICD-10-CM | POA: Diagnosis not present

## 2023-08-26 DIAGNOSIS — R3129 Other microscopic hematuria: Secondary | ICD-10-CM | POA: Diagnosis not present

## 2023-08-26 LAB — URINALYSIS, COMPLETE
Bilirubin, UA: NEGATIVE
Glucose, UA: NEGATIVE
Ketones, UA: NEGATIVE
Nitrite, UA: NEGATIVE
Protein,UA: NEGATIVE
Specific Gravity, UA: 1.02 (ref 1.005–1.030)
Urobilinogen, Ur: 1 mg/dL (ref 0.2–1.0)
pH, UA: 7 (ref 5.0–7.5)

## 2023-08-26 LAB — MICROSCOPIC EXAMINATION

## 2023-08-26 NOTE — Progress Notes (Signed)
I, [name], acting as a scribe for Jamie Altes, MD., have documented all relevant documentation on the behalf of Jamie Altes, MD, as directed by Jamie Altes, MD while in the presence of Jamie Altes, MD.  08/26/2023 2:38 PM   Jamie Byrd 02/10/1934 [id number]  Referring provider: [name], MD [address] [place],  [place] [address]  Chief Complaint  Patient presents with   Benign Prostatic Hypertrophy   Hematuria    HPI: Jamie Byrd is a 89 y.o. male presents for annual follow-up of   Initially seen November 2023 for BPH with mild LUTS. Bladder wall thickening was seen on CT. He did have microhematuria at 3-10 RBCs. A CT of the abdomen and pelvis was unremarkable. Cystoscopy performed 09/19/2022 showed prominent lateral lobe enlargement with hypervascularity/viability and heavily trabeculated bladder with diverticula.  Since last year's visit, he has no bothersome lower urinary tract symptoms. Denies gross hematuria. He complains of a 3 month history of right flank/low back pain. He was also complaining of this at last year's visit.  Denies gross hematuria.    PMH: Past Medical History:  Diagnosis Date   Actinic keratosis 10/28/2022   upper nasal dorsum, EDC   BCC (basal cell carcinoma) 09/12/2021   right neck posterior EDC 11/21/2021   BCC (basal cell carcinoma) 09/12/2021   right temple, EDC 02/18/2022   BCC (basal cell carcinoma) 09/12/2021   left temple, Mohs 12/18/21   BCC (basal cell carcinoma) 09/12/2021   left zygoma, Clear with biopsy 02/18/2022   BCC (basal cell carcinoma) 10/24/2021   right nasolabial fold, MOHs 01/01/2022   BPH (benign prostatic hypertrophy)    Difficulty reading    Difficulty seeing    Difficulty in seeing certain things and reading/ not legally blind!   Gout    History of shingles    Hyperlipidemia    Hypertension    Hypothyroidism    Macular degeneration    Squamous cell carcinoma of neck  09/12/2021   right neck anterior, well differentiated, base involved EDC 11/21/2021   Squamous cell carcinoma of skin 11/21/2021   right dorsal hand, EDC   Squamous cell carcinoma of skin 04/21/2022   R med cheek, Atypical Squamous Proliferation w/Cystic Change, exc 06/09/2022  Mohs surgery for a recurrent moderately differentiated SCC R med cheek 07/23/22    Surgical History: Past Surgical History:  Procedure Laterality Date   COLONOSCOPY     CORONARY ATHERECTOMY N/A 09/26/2021   Procedure: CORONARY ATHERECTOMY;  Surgeon: Elder Negus, MD;  [facility];  Service: Cardiovascular;  Laterality: N/A;   CORONARY IMAGING/OCT N/A 09/26/2021   Procedure: INTRAVASCULAR IMAGING/OCT;  Surgeon: Elder Negus, MD;  [facility];  Service: Cardiovascular;  Laterality: N/A;   CORONARY STENT INTERVENTION N/A 09/26/2021   Procedure: CORONARY STENT INTERVENTION;  Surgeon: Elder Negus, MD;  [facility];  Service: Cardiovascular;  Laterality: N/A;   LEFT HEART CATH AND CORONARY ANGIOGRAPHY N/A 09/25/2021   Procedure: LEFT HEART CATH AND CORONARY ANGIOGRAPHY;  Surgeon: Elder Negus, MD;  [facility];  Service: Cardiovascular;  Laterality: N/A;   POLYPECTOMY      Home Medications:  Allergies as of 08/26/2023   No Known Allergies      Medication List        Accurate as of August 26, 2023  2:38 PM. If you have any questions, ask  your nurse or doctor.          allopurinol 100 MG tablet Commonly known as: ZYLOPRIM TAKE ONE TABLET BY MOUTH ONCE A DAY   aspirin EC 81 MG tablet Take 1 tablet (81 mg total) by mouth daily. Swallow whole.   cyanocobalamin 1000 MCG tablet Commonly known as: VITAMIN B12 Take 1,000 mcg by mouth daily.   levothyroxine 88 MCG tablet Commonly known as: SYNTHROID TAKE ONE TABLET BY MOUTH ONCE A DAY BEFORE BREAKFAST.   lisinopril 10 MG tablet Commonly known as:  ZESTRIL TAKE ONE TABLET BY MOUTH DAILY   metoprolol succinate 25 MG 24 hr tablet Commonly known as: TOPROL-XL Take 0.5 tablets (12.5 mg total) by mouth daily. Take with or immediately following a meal.   nitroGLYCERIN 0.4 MG SL tablet Commonly known as: NITROSTAT Place 1 tablet (0.4 mg total) under the tongue every 5 (five) minutes as needed for chest pain.   rosuvastatin 20 MG tablet Commonly known as: CRESTOR TAKE ONE TABLET BY MOUTH ONCE A DAY        Allergies: No Known Allergies  Family History: Family History  Problem Relation Age of Onset   Heart disease Mother        CABG   Heart disease Father        CAD   Cancer Father        lung CA   Cancer Brother        lung CA    Social History:  reports that he quit smoking about 54 years ago. His smoking use included cigarettes. He started smoking about 69 years ago. He has a 22.5 pack-year smoking history. He has never used smokeless tobacco. He reports that he does not drink alcohol and does not use drugs.   Physical Exam: BP (!) 182/85   Pulse 65   Ht 5\' 11"  (1.803 m)   Wt 201 lb 6 oz (91.3 kg)   BMI 28.09 kg/m   Constitutional:  Alert, No acute distress. HEENT: [place] AT Respiratory: Normal respiratory effort, no increased work of breathing. GI: Abdomen is soft, nontender, nondistended, no abdominal masses Psychiatric: Normal mood and affect.   Urinalysis 6-10 WBC/3-10 RBC   Assessment & Plan:    1. BPH with LUTS  No bothersome lower urinary tract symptoms  2. Microhematuria Stable  3. Right back pain Most likely musculoskeletal in etiology.  Schedule follow-up renal ultrasound, and we'll call with results.  If no abnormalities on ultrasound, we'll see him back as needed.  Recommend PCP check annual UA.   I have reviewed the above documentation for accuracy and completeness, and I agree with the above.   Jamie Altes, MD  [facility] [address], [address] [place], [place] [address] [phone]

## 2023-09-03 ENCOUNTER — Ambulatory Visit: Payer: Medicare Other

## 2023-09-03 ENCOUNTER — Ambulatory Visit
Admission: RE | Admit: 2023-09-03 | Discharge: 2023-09-03 | Disposition: A | Discharge status: Home / Self Care | Payer: Medicare Other | Source: Ambulatory Visit | Attending: Urology | Admitting: Urology

## 2023-09-03 DIAGNOSIS — R3129 Other microscopic hematuria: Secondary | ICD-10-CM | POA: Diagnosis not present

## 2023-09-03 DIAGNOSIS — R319 Hematuria, unspecified: Secondary | ICD-10-CM | POA: Diagnosis not present

## 2023-09-08 ENCOUNTER — Other Ambulatory Visit (HOSPITAL_COMMUNITY): Payer: Self-pay

## 2023-09-29 DIAGNOSIS — H353130 Nonexudative age-related macular degeneration, bilateral, stage unspecified: Secondary | ICD-10-CM | POA: Diagnosis not present

## 2023-10-01 ENCOUNTER — Ambulatory Visit (INDEPENDENT_AMBULATORY_CARE_PROVIDER_SITE_OTHER): Payer: Medicare Other

## 2023-10-01 DIAGNOSIS — Z23 Encounter for immunization: Secondary | ICD-10-CM | POA: Diagnosis not present

## 2023-11-09 ENCOUNTER — Ambulatory Visit (INDEPENDENT_AMBULATORY_CARE_PROVIDER_SITE_OTHER): Payer: Medicare Other | Admitting: Dermatology

## 2023-11-09 DIAGNOSIS — L814 Other melanin hyperpigmentation: Secondary | ICD-10-CM | POA: Diagnosis not present

## 2023-11-09 DIAGNOSIS — L578 Other skin changes due to chronic exposure to nonionizing radiation: Secondary | ICD-10-CM | POA: Diagnosis not present

## 2023-11-09 DIAGNOSIS — L820 Inflamed seborrheic keratosis: Secondary | ICD-10-CM

## 2023-11-09 DIAGNOSIS — Z85828 Personal history of other malignant neoplasm of skin: Secondary | ICD-10-CM

## 2023-11-09 DIAGNOSIS — W908XXA Exposure to other nonionizing radiation, initial encounter: Secondary | ICD-10-CM

## 2023-11-09 DIAGNOSIS — L570 Actinic keratosis: Secondary | ICD-10-CM | POA: Diagnosis not present

## 2023-11-09 DIAGNOSIS — D229 Melanocytic nevi, unspecified: Secondary | ICD-10-CM

## 2023-11-09 DIAGNOSIS — Z1283 Encounter for screening for malignant neoplasm of skin: Secondary | ICD-10-CM

## 2023-11-09 DIAGNOSIS — L821 Other seborrheic keratosis: Secondary | ICD-10-CM

## 2023-11-09 DIAGNOSIS — D1801 Hemangioma of skin and subcutaneous tissue: Secondary | ICD-10-CM

## 2023-11-09 NOTE — Progress Notes (Signed)
Follow-Up Visit   Subjective  Jamie Byrd is a 89 y.o. male who presents for the following: Skin Cancer Screening and Upper Body Skin Exam  The patient presents for Upper Body Skin Exam (UBSE) for skin cancer screening and mole check. The patient has spots, moles and lesions to be evaluated, some may be new or changing and the patient may have concern these could be cancer. Some spots he picks at and are irritating.  The following portions of the chart were reviewed this encounter and updated as appropriate: medications, allergies, medical history  Review of Systems:  No other skin or systemic complaints except as noted in HPI or Assessment and Plan.  Objective  Well appearing patient in no apparent distress; mood and affect are within normal limits.  All skin waist up examined. Relevant physical exam findings are noted in the Assessment and Plan.  R lower neck x 2, L hand dorsum x 2 (4) Keratotic papules and macules. L wrist x 1, L forearm x 1, R forearm x 2 (4) Erythematous stuck-on, waxy papule or plaque L zygoma x 1, L temple x 1, R postauricular x 2, R proximal nail fold thumb x 1, R index x 1, R malar cheek x 1, R lower cheek x 1, R upper eyebrow x 1 (9) Erythematous thin papules/macules with gritty scale.   Assessment & Plan   HYPERTROPHIC ACTINIC KERATOSIS (4) R lower neck x 2, L hand dorsum x 2 (4) Actinic keratoses are precancerous spots that appear secondary to cumulative UV radiation exposure/sun exposure over time. They are chronic with expected duration over 1 year. A portion of actinic keratoses will progress to squamous cell carcinoma of the skin. It is not possible to reliably predict which spots will progress to skin cancer and so treatment is recommended to prevent development of skin cancer.  Recommend daily broad spectrum sunscreen SPF 30+ to sun-exposed areas, reapply every 2 hours as needed.  Recommend staying in the shade or wearing long sleeves, sun  glasses (UVA+UVB protection) and wide brim hats (4-inch brim around the entire circumference of the hat). Call for new or changing lesions. Destruction of lesion - R lower neck x 2, L hand dorsum x 2 (4)  Destruction method: cryotherapy   Informed consent: discussed and consent obtained   Lesion destroyed using liquid nitrogen: Yes   Region frozen until ice ball extended beyond lesion: Yes   Outcome: patient tolerated procedure well with no complications   Post-procedure details: wound care instructions given   Additional details:  Prior to procedure, discussed risks of blister formation, small wound, skin dyspigmentation, or rare scar following cryotherapy. Recommend Vaseline ointment to treated areas while healing.  INFLAMED SEBORRHEIC KERATOSIS (4) L wrist x 1, L forearm x 1, R forearm x 2 (4) Symptomatic, irritating, patient would like treated. Destruction of lesion - L wrist x 1, L forearm x 1, R forearm x 2 (4)  Destruction method: cryotherapy   Informed consent: discussed and consent obtained   Lesion destroyed using liquid nitrogen: Yes   Region frozen until ice ball extended beyond lesion: Yes   Outcome: patient tolerated procedure well with no complications   Post-procedure details: wound care instructions given   Additional details:  Prior to procedure, discussed risks of blister formation, small wound, skin dyspigmentation, or rare scar following cryotherapy. Recommend Vaseline ointment to treated areas while healing.  AK (ACTINIC KERATOSIS) (9) L zygoma x 1, L temple x 1, R postauricular x 2,  R proximal nail fold thumb x 1, R index x 1, R malar cheek x 1, R lower cheek x 1, R upper eyebrow x 1 (9) vs ISK. Recheck R proximal nail fold thumb on f/u.  RTC if not improving  Actinic keratoses are precancerous spots that appear secondary to cumulative UV radiation exposure/sun exposure over time. They are chronic with expected duration over 1 year. A portion of actinic keratoses  will progress to squamous cell carcinoma of the skin. It is not possible to reliably predict which spots will progress to skin cancer and so treatment is recommended to prevent development of skin cancer.  Recommend daily broad spectrum sunscreen SPF 30+ to sun-exposed areas, reapply every 2 hours as needed.  Recommend staying in the shade or wearing long sleeves, sun glasses (UVA+UVB protection) and wide brim hats (4-inch brim around the entire circumference of the hat). Call for new or changing lesions. Destruction of lesion - L zygoma x 1, L temple x 1, R postauricular x 2, R proximal nail fold thumb x 1, R index x 1, R malar cheek x 1, R lower cheek x 1, R upper eyebrow x 1 (9)  Destruction method: cryotherapy   Informed consent: discussed and consent obtained   Lesion destroyed using liquid nitrogen: Yes   Region frozen until ice ball extended beyond lesion: Yes   Outcome: patient tolerated procedure well with no complications   Post-procedure details: wound care instructions given   Additional details:  Prior to procedure, discussed risks of blister formation, small wound, skin dyspigmentation, or rare scar following cryotherapy. Recommend Vaseline ointment to treated areas while healing.  Skin cancer screening performed today.  Actinic Damage - Chronic condition, secondary to cumulative UV/sun exposure - diffuse scaly erythematous macules with underlying dyspigmentation - Recommend daily broad spectrum sunscreen SPF 30+ to sun-exposed areas, reapply every 2 hours as needed.  - Staying in the shade or wearing long sleeves, sun glasses (UVA+UVB protection) and wide brim hats (4-inch brim around the entire circumference of the hat) are also recommended for sun protection.  - Call for new or changing lesions.  Lentigines, Seborrheic Keratoses, Hemangiomas - Benign normal skin lesions - Benign-appearing - Call for any changes  Melanocytic Nevi - Tan-brown and/or pink-flesh-colored  symmetric macules and papules - Benign appearing on exam today - Observation - Call clinic for new or changing moles - Recommend daily use of broad spectrum spf 30+ sunscreen to sun-exposed areas.   HISTORY OF BASAL CELL CARCINOMA OF THE SKIN - No evidence of recurrence today - Recommend regular full body skin exams - Recommend daily broad spectrum sunscreen SPF 30+ to sun-exposed areas, reapply every 2 hours as needed.  - Call if any new or changing lesions are noted between office visits  HISTORY OF SQUAMOUS CELL CARCINOMA OF THE SKIN - No evidence of recurrence today - Recommend regular full body skin exams - Recommend daily broad spectrum sunscreen SPF 30+ to sun-exposed areas, reapply every 2 hours as needed.  - Call if any new or changing lesions are noted between office visits   Return in about 6 months (around 05/08/2024) for AKs, Recheck R thumb.  ICherlyn Labella, CMA, am acting as scribe for Willeen Niece, MD .   Documentation: I have reviewed the above documentation for accuracy and completeness, and I agree with the above.  Willeen Niece, MD

## 2023-11-09 NOTE — Patient Instructions (Addendum)
Cryotherapy Aftercare  Wash gently with soap and water everyday.   Apply Vaseline and Band-Aid daily until healed.    Due to recent changes in healthcare laws, you may see results of your pathology and/or laboratory studies on MyChart before the doctors have had a chance to review them. We understand that in some cases there may be results that are confusing or concerning to you. Please understand that not all results are received at the same time and often the doctors may need to interpret multiple results in order to provide you with the best plan of care or course of treatment. Therefore, we ask that you please give [place] 2 business days to thoroughly review all your results before contacting the office for clarification. Should we see a critical lab result, you will be contacted sooner.   If You Need Anything After Your Visit  If you have any questions or concerns for your doctor, please call our main line at [phone] and press option 4 to reach your doctor's medical assistant. If no one answers, please leave a voicemail as directed and we will return your call as soon as possible. Messages left after 4 pm will be answered the following business day.   You may also send [place] a message via MyChart. We typically respond to MyChart messages within 1-2 business days.  For prescription refills, please ask your pharmacy to contact our office. Our fax number is [phone].  If you have an urgent issue when the clinic is closed that cannot wait until the next business day, you can page your doctor at the number below.    Please note that while we do our best to be available for urgent issues outside of office hours, we are not available 24/7.   If you have an urgent issue and are unable to reach [place], you may choose to seek medical care at your doctor's office, retail clinic, urgent care center, or emergency room.  If you have a medical emergency, please immediately call 911 or go to the emergency  department.  Pager Numbers  - Dr. Gwen Pounds: [phone]  - Dr. Roseanne Reno: [phone]  - Dr. Katrinka Blazing: [phone]   In the event of inclement weather, please call our main line at [phone] for an update on the status of any delays or closures.  Dermatology Medication Tips: Please keep the boxes that topical medications come in in order to help keep track of the instructions about where and how to use these. Pharmacies typically print the medication instructions only on the boxes and not directly on the medication tubes.   If your medication is too expensive, please contact our office at [phone] option 4 or send [place] a message through MyChart.   We are unable to tell what your co-pay for medications will be in advance as this is different depending on your insurance coverage. However, we may be able to find a substitute medication at lower cost or fill out paperwork to get insurance to cover a needed medication.   If a prior authorization is required to get your medication covered by your insurance company, please allow [place] 1-2 business days to complete this process.  Drug prices often vary depending on where the prescription is filled and some pharmacies may offer cheaper prices.  The website www.goodrx.com contains coupons for medications through different pharmacies. The prices here do not account for what the cost may be with help from insurance (it may be cheaper with your insurance), but the website can  give you the price if you did not use any insurance.  - You can print the associated coupon and take it with your prescription to the pharmacy.  - You may also stop by our office during regular business hours and pick up a GoodRx coupon card.  - If you need your prescription sent electronically to a different pharmacy, notify our office through [facility] or by phone at [phone] option 4.     Si Usted Necesita Algo Despus de Su Visita  Tambin puede enviarnos  un mensaje a travs de Clinical cytogeneticist. Por lo general respondemos a los mensajes de MyChart en el transcurso de 1 a 2 das hbiles.  Para renovar recetas, por favor pida a su farmacia que se ponga en contacto con nuestra oficina. Annie Sable de fax es [place] [phone].  Si tiene un asunto urgente cuando la clnica est cerrada y que no puede esperar hasta el siguiente da hbil, puede llamar/localizar a su doctor(a) al nmero que aparece a continuacin.   Por favor, tenga en cuenta que aunque hacemos todo lo posible para estar disponibles para asuntos urgentes fuera del horario de [place] [place], no estamos disponibles las 24 horas del da, los 7 [address]  [address] de la [place].   Si tiene un problema urgente y no puede comunicarse con nosotros, puede optar por buscar atencin mdica  en el consultorio de su doctor(a), en una clnica privada, en un centro de atencin urgente o en una sala de emergencias.  Si tiene Engineer, drilling, por favor llame inmediatamente al 911 o vaya a la sala de emergencias.  Nmeros de bper  - Dr. Gwen Pounds: [phone]  - Dra. Roseanne Reno: [phone]  - Dr. Katrinka Blazing: [phone]   En caso de inclemencias del tiempo, por favor llame a Lacy Duverney principal al [phone] para una actualizacin sobre el [place] de cualquier retraso o cierre.  Consejos para la medicacin en dermatologa: Por favor, guarde las cajas en las que vienen los medicamentos de uso tpico para ayudarle a seguir las instrucciones sobre dnde y cmo usarlos. Las farmacias generalmente imprimen las instrucciones del medicamento slo en las cajas y no directamente en los tubos del [place].   Si su medicamento es muy caro, por favor, pngase en contacto con Rolm Gala llamando al [phone] y presione la opcin 4 o envenos un mensaje a travs de Clinical cytogeneticist.   No podemos decirle cul ser su copago por los medicamentos por adelantado ya que esto es diferente dependiendo de la cobertura de su seguro. Sin  embargo, es posible que podamos encontrar un medicamento sustituto a Audiological scientist un formulario para que el seguro cubra el medicamento que se considera necesario.   Si se requiere una autorizacin previa para que su compaa de seguros [place] su medicamento, por favor permtanos de 1 a 2 das hbiles para completar [address]  Los precios de los medicamentos varan con frecuencia dependiendo del Environmental consultant de dnde se surte la receta y alguna farmacias pueden ofrecer precios ms baratos.  El sitio web www.goodrx.com tiene cupones para medicamentos de Health and safety inspector. Los precios aqu no tienen en cuenta lo que podra costar con la ayuda del seguro (puede ser ms barato con su seguro), pero el sitio web puede darle el precio si no utiliz Tourist information centre manager.  - Puede imprimir el cupn correspondiente y llevarlo con su receta a la farmacia.  - Tambin puede pasar por nuestra oficina durante el horario de atencin regular y Education officer, museum una tarjeta de cupones de GoodRx.  -  Si necesita que su receta se enve electrnicamente a Psychiatrist, informe a nuestra oficina a travs de MyChart de [place] o por telfono llamando al [phone] y presione la opcin 4.

## 2023-11-13 DIAGNOSIS — H353134 Nonexudative age-related macular degeneration, bilateral, advanced atrophic with subfoveal involvement: Secondary | ICD-10-CM | POA: Diagnosis not present

## 2023-12-28 DIAGNOSIS — H35033 Hypertensive retinopathy, bilateral: Secondary | ICD-10-CM | POA: Diagnosis not present

## 2023-12-28 DIAGNOSIS — Z961 Presence of intraocular lens: Secondary | ICD-10-CM | POA: Diagnosis not present

## 2023-12-28 DIAGNOSIS — H35423 Microcystoid degeneration of retina, bilateral: Secondary | ICD-10-CM | POA: Diagnosis not present

## 2023-12-28 DIAGNOSIS — H353134 Nonexudative age-related macular degeneration, bilateral, advanced atrophic with subfoveal involvement: Secondary | ICD-10-CM | POA: Diagnosis not present

## 2023-12-28 DIAGNOSIS — H31092 Other chorioretinal scars, left eye: Secondary | ICD-10-CM | POA: Diagnosis not present

## 2023-12-28 DIAGNOSIS — H43813 Vitreous degeneration, bilateral: Secondary | ICD-10-CM | POA: Diagnosis not present

## 2023-12-28 DIAGNOSIS — H35433 Paving stone degeneration of retina, bilateral: Secondary | ICD-10-CM | POA: Diagnosis not present

## 2023-12-30 ENCOUNTER — Other Ambulatory Visit: Payer: Self-pay | Admitting: Family Medicine

## 2023-12-31 ENCOUNTER — Other Ambulatory Visit: Payer: Self-pay | Admitting: Family Medicine

## 2024-01-05 ENCOUNTER — Ambulatory Visit: Attending: Cardiology | Admitting: Cardiology

## 2024-01-05 ENCOUNTER — Encounter: Payer: Self-pay | Admitting: Cardiology

## 2024-01-05 VITALS — BP 128/72 | HR 66 | Resp 16 | Ht 71.0 in | Wt 202.0 lb

## 2024-01-05 DIAGNOSIS — I2510 Atherosclerotic heart disease of native coronary artery without angina pectoris: Secondary | ICD-10-CM | POA: Diagnosis not present

## 2024-01-05 DIAGNOSIS — I10 Essential (primary) hypertension: Secondary | ICD-10-CM | POA: Insufficient documentation

## 2024-01-05 DIAGNOSIS — I77810 Thoracic aortic ectasia: Secondary | ICD-10-CM | POA: Diagnosis not present

## 2024-01-05 NOTE — Patient Instructions (Signed)
 Medication Instructions:  Your physician recommends that you continue on your current medications as directed. Please refer to the Current Medication list given to you today.  *If you need a refill on your cardiac medications before your next appointment, please call your pharmacy*  Lab Work: None ordered today. If you have labs (blood work) drawn today and your tests are completely normal, you will receive your results only by: MyChart Message (if you have MyChart) OR A paper copy in the mail If you have any lab test that is abnormal or we need to change your treatment, we will call you to review the results.  Testing/Procedures: Your physician has requested that you have an echocardiogram in August 2025. Echocardiography is a painless test that uses sound waves to create images of your heart. It provides your doctor with information about the size and shape of your heart and how well your heart's chambers and valves are working. This procedure takes approximately one hour. There are no restrictions for this procedure. Please do NOT wear cologne, perfume, aftershave, or lotions (deodorant is allowed). Please arrive 15 minutes prior to your appointment time.  Please note: We ask at that you not bring children with you during ultrasound (echo/ vascular) testing. Due to room size and safety concerns, children are not allowed in the ultrasound rooms during exams. Our front office staff cannot provide observation of children in our lobby area while testing is being conducted. An adult accompanying a patient to their appointment will only be allowed in the ultrasound room at the discretion of the ultrasound technician under special circumstances. We apologize for any inconvenience.   Follow-Up: At [facility], you and your health needs are our priority.  As part of our continuing mission to provide you with exceptional heart care, we have created designated Provider Care Teams.  These Care Teams  include your primary Cardiologist (physician) and Advanced Practice Providers (APPs -  Physician Assistants and Nurse Practitioners) who all work together to provide you with the care you need, when you need it.  We recommend signing up for the patient portal called "MyChart".  Sign up information is provided on this After Visit Summary.  MyChart is used to connect with patients for Virtual Visits (Telemedicine).  Patients are able to view lab/test results, encounter notes, upcoming appointments, etc.  Non-urgent messages can be sent to your provider as well.   To learn more about what you can do with MyChart, go to [email or web address].    Your next appointment:   6 month(s)  The format for your next appointment:   In Person  Provider:   Tessa Lerner, DO {

## 2024-01-05 NOTE — Progress Notes (Signed)
 Cardiology Office Note:  .   Date:  01/05/2024  ID:  Jamie Byrd, DOB 1934-07-26, MRN [id number] PCP:  [name], MD  Former Cardiology Providers: Dr. Clotilde Dieter [facility] Providers Cardiologist:  Tessa Lerner, DO , [facility] (established care 01/05/24) Electrophysiologist:  None  Click to update primary MD,subspecialty MD or APP then REFRESH:1}    Chief Complaint  Patient presents with   Coronary artery disease of native artery of native heart wi   Follow-up    History of Present Illness: .   Jamie Byrd is a 89 y.o. Caucasian male whose past medical history and cardiovascular risk factors includes: Hypertension, hypothyroidism, prediabetes, former smoker, history of CAD status post complex PCI to the LAD (December 2022).  Formally under the care of Dr. Rozell Searing Custovic who last saw Jamie Byrd back in May 2024. I am seeing him for the first time to re-establishing care.   Patient presents today for approximately 1 year follow-up visit.  Patient is accompanied by his sister and he provides verbal consent with having her present during today's encounter.  Since last office visit he denies any anginal chest pain or heart failure symptoms.  Overall functional capacity still remains stable.  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  No hospitalizations or urgent care visits for cardiovascular reasons.  Patient states that his height used to be around 6 feet but due to aging he is currently 5 feet 11 inches.  He has a history of dilated aortic root which is being monitored by serial echocardiograms.  Review of Systems: .   Review of Systems  Cardiovascular:  Negative for chest pain, claudication, irregular heartbeat, leg swelling, near-syncope, orthopnea, palpitations, paroxysmal nocturnal dyspnea and syncope.  Respiratory:  Negative for shortness of breath.   Hematologic/Lymphatic: Negative for bleeding problem.     Studies Reviewed:   EKG: EKG Interpretation Date/Time:  Tuesday January 05 2024 11:39:30 EDT Ventricular Rate:  60 PR Interval:  226 QRS Duration:  74 QT Interval:  372 QTC Calculation: 372 R Axis:   63  Text Interpretation: Sinus rhythm with 1st degree A-V block Consider Anteroseptal infarct (cited on or before 24-Sep-2021) When compared with ECG of 27-Sep-2021 05:33, Premature atrial complexes are no longer Present Confirmed by Tessa Lerner [phone]) on 01/05/2024 12:07:27 PM  Echocardiogram: 12/30/2021: LVEF 60 to 65%, normal diastolic function, Aortic root is mildly dilated (Sinus of Valsalva 3.96cm and  Sinotubular junction 3.8cm). Proximal ascending aorta upper limit of normal 3.7cm.Compared to study 09/25/2021 Aortic root was 43mm and now aortic root measures (Sinus of Valsalva 3.96cm and  Sinotubular junction 3.8cm).  02/2023: LVEF 55 to 60%, normal diastolic function,Aortic root 39 mm  Coronary angiogram 09/25/2021: LM: Normal LAD: Ostial 99% stenosis with severe calcification Ramus: Mid 80% eccentric stenosis with severe calcification Lcx: Small OM1 with 50% disease        Large OM1 with mid 30% disease RCA: Prox 50% stenosis with severe calcification   LVEDP normal LVEF normal on echocardiogram   Given location of the ostial LAD lesion with severe calcification with a large ramus arising at an acute angle with respect to LAD.  Will consult CVTS for possible CABG to LAD and ramus Limiting factors could be age and aorta calcification. 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 He does not have living children, but has brothers and sisters in their 76s. I spoke with his  sister Anabel Bene [phone])   If not a surgical candidate, could consider high risk PCI ostial LAD, along with PCI to ramus   Coronary intervention 09/26/2021: Coronary intervention: 80% mid high diag stenosis.  99% ostial LAD stenosis Successful percutaneous  coronary intervention mid high diag and ostial LAD        PTCA and overlapping stents placement high diag        3.0 X 22 mm Onyx Frontier drug-eluting stent        3.0 X 15 mm Onyx Frontier drug-eluting stent         Post dilatation with 3.25 mm [place] balloon        80%--->0% residual stenosis. TIMI flow II-->III        PTCA and overlapping stents placement ostial LAD        3.0 X 15 mm Onyx Frontier drug-eluting stent        Post dilatation with 3.5 mm [place] balloon        99%--->0% residual stenosis. TIMI flow II-->III  RADIOLOGY: NA  Risk Assessment/Calculations:   NA   Labs:       Latest Ref Rng & Units 08/14/2023    9:03 AM 06/05/2023    8:24 AM 05/29/2023    9:32 AM  CBC  WBC 4.0 - 10.5 K/uL 5.4  5.8  5.6   Hemoglobin 13.0 - 17.0 g/dL 09.8  11.9  14.7   Hematocrit 39.0 - 52.0 % 39.5  39.0  39.2   Platelets 150.0 - 400.0 K/uL 127.0  131  132.0        Latest Ref Rng & Units 06/05/2023    8:24 AM 05/29/2023    9:32 AM 10/03/2022    9:51 AM  BMP  Glucose 70 - 99 mg/dL 829  86  562   BUN 6 - 23 mg/dL 14  13  13    Creatinine 0.40 - 1.50 mg/dL 1.30  8.65  7.84   Sodium 135 - 145 mEq/L 137  132  136   Potassium 3.5 - 5.1 mEq/L 5.1  4.2  5.0   Chloride 96 - 112 mEq/L 102  99  102   CO2 19 - 32 mEq/L 29  28  30    Calcium 8.4 - 10.5 mg/dL 9.8  9.9  9.8       Latest Ref Rng & Units 06/05/2023    8:24 AM 05/29/2023    9:32 AM 10/03/2022    9:51 AM  CMP  Glucose 70 - 99 mg/dL 696  86  295   BUN 6 - 23 mg/dL 14  13  13    Creatinine 0.40 - 1.50 mg/dL 2.84  1.32  4.40   Sodium 135 - 145 mEq/L 137  132  136   Potassium 3.5 - 5.1 mEq/L 5.1  4.2  5.0   Chloride 96 - 112 mEq/L 102  99  102   CO2 19 - 32 mEq/L 29  28  30    Calcium 8.4 - 10.5 mg/dL 9.8  9.9  9.8   Total Protein 6.0 - 8.3 g/dL  6.7    Total Bilirubin 0.2 - 1.2 mg/dL  1.3    Alkaline Phos 39 - 117 U/L  102    AST 0 - 37 U/L  24    ALT 0 - 53 U/L  9      Lab Results  Component Value Date   CHOL 73 05/29/2023    HDL 27.40 (L) 05/29/2023   LDLCALC  32 05/29/2023   TRIG 68.0 05/29/2023   CHOLHDL 3 05/29/2023   No results for input(s): "LIPOA" in the last 8760 hours. No components found for: "NTPROBNP" No results for input(s): "PROBNP" in the last 8760 hours. Recent Labs    05/29/23 0932  TSH 3.14    Physical Exam:    Today's Vitals   01/05/24 1137  BP: 128/72  Pulse: 66  Resp: 16  SpO2: 99%  Weight: 202 lb (91.6 kg)  Height: 5\' 11"  (1.803 m)   Body mass index is 28.17 kg/m. Wt Readings from Last 3 Encounters:  01/05/24 202 lb (91.6 kg)  08/26/23 201 lb 6 oz (91.3 kg)  06/12/23 199 lb 2 oz (90.3 kg)    Physical Exam  Constitutional: No distress.  hemodynamically stable  Neck: No JVD present.  Cardiovascular: Normal rate, regular rhythm, S1 normal and S2 normal. Exam reveals no gallop, no S3 and no S4.  No murmur heard. Pulmonary/Chest: Effort normal and breath sounds normal. No stridor. He has no wheezes. He has no rales.  Musculoskeletal:        General: No edema.     Cervical back: Neck supple.  Skin: Skin is warm.     Impression & Recommendation(s):  Impression:   ICD-10-CM   1. Coronary artery disease involving native coronary artery of native heart without angina pectoris  I25.10 EKG 12-Lead    ECHOCARDIOGRAM COMPLETE    2. Dilated aortic root (HCC)  I77.810 ECHOCARDIOGRAM COMPLETE    3. Essential hypertension  I10 ECHOCARDIOGRAM COMPLETE       Recommendation(s):  Coronary artery disease involving native coronary artery of native heart without angina pectoris Denies anginal chest pain. No use of sublingual nitroglycerin tablets since the last office visit. EKG nonischemic. Continue aspirin and statin therapy. Continue Toprol-XL 12.5 mg p.o. daily. Continue lisinopril 10 mg p.o. daily. Status post coronary intervention in December 2022. Most recent lipid profile independently reviewed from [facility] database.  As of August 2024 LDL is 32 mg/dL on current medical  therapy  Dilated aortic root (HCC) Chronic. Office blood pressures are very well-controlled. Currently on ACE inhibitors and beta-blocker therapy. Follow-up echo in August 2025 to reevaluate his aortic dimensions.  Essential hypertension Office blood pressures are well-controlled. Continue lisinopril 10 mg p.o. daily. Continue Toprol-XL 12.5 mg p.o. daily. Reemphasized importance of low-salt diet. Patient states that the sister will call us back to make sure medication reconciliation is correct.   Orders Placed:  Orders Placed This Encounter  Procedures   EKG 12-Lead   ECHOCARDIOGRAM COMPLETE    Standing Status:   Future    Expected Date:   05/16/2024    Expiration Date:   01/04/2025    Where should this test be performed:   [facility])    Does the patient weigh less than or greater than 250 lbs?:   Patient weighs less than 250 lbs    Perflutren DEFINITY (image enhancing agent) should be administered unless hypersensitivity or allergy exist:   Administer Perflutren    Reason for exam-Echo:   Other-Full Diagnosis List    Full ICD-10/Reason for Exam:   Aortic root dilatation (HCC) [322010]     Final Medication List:   No orders of the defined types were placed in this encounter.   There are no discontinued medications.   Current Outpatient Medications:    allopurinol (ZYLOPRIM) 100 MG tablet, TAKE ONE TABLET BY MOUTH ONCE A DAY, Disp: 90 tablet, Rfl: 2  aspirin 81 MG EC tablet, Take 1 tablet (81 mg total) by mouth daily. Swallow whole., Disp: 30 tablet, Rfl: 2   cyanocobalamin (VITAMIN B12) 1000 MCG tablet, Take 1,000 mcg by mouth daily., Disp: , Rfl:    levothyroxine (SYNTHROID) 88 MCG tablet, TAKE ONE TABLET BY MOUTH ONCE A DAY BEFORE BREAKFAST., Disp: 90 tablet, Rfl: 1   lisinopril (ZESTRIL) 10 MG tablet, TAKE ONE TABLET BY MOUTH DAILY, Disp: 90 tablet, Rfl: 1   nitroGLYCERIN (NITROSTAT) 0.4 MG SL tablet, Place 1 tablet (0.4 mg total) under the tongue  every 5 (five) minutes as needed for chest pain., Disp: 30 tablet, Rfl: 2   rosuvastatin (CRESTOR) 20 MG tablet, TAKE ONE TABLET BY MOUTH ONCE A DAY, Disp: 90 tablet, Rfl: 2   metoprolol succinate (TOPROL-XL) 25 MG 24 hr tablet, Take 0.5 tablets (12.5 mg total) by mouth daily. Take with or immediately following a meal. (Patient not taking: Reported on 01/05/2024), Disp: 45 tablet, Rfl: 3  Consent:   NA  Disposition:   September 2025 sooner if needed  His questions and concerns were addressed to his satisfaction. He voices understanding of the recommendations provided during this encounter.    Signed, Tessa Lerner, DO, [facility] [place] [facility]  [facility]  [address] [place], [place] [address] 01/05/2024 1:03 PM

## 2024-01-21 ENCOUNTER — Encounter

## 2024-02-02 ENCOUNTER — Ambulatory Visit: Admitting: Dermatology

## 2024-02-02 ENCOUNTER — Encounter: Payer: Self-pay | Admitting: Dermatology

## 2024-02-02 DIAGNOSIS — C44629 Squamous cell carcinoma of skin of left upper limb, including shoulder: Secondary | ICD-10-CM

## 2024-02-02 DIAGNOSIS — D492 Neoplasm of unspecified behavior of bone, soft tissue, and skin: Secondary | ICD-10-CM

## 2024-02-02 DIAGNOSIS — D485 Neoplasm of uncertain behavior of skin: Secondary | ICD-10-CM

## 2024-02-02 DIAGNOSIS — L700 Acne vulgaris: Secondary | ICD-10-CM

## 2024-02-02 DIAGNOSIS — C44329 Squamous cell carcinoma of skin of other parts of face: Secondary | ICD-10-CM | POA: Diagnosis not present

## 2024-02-02 DIAGNOSIS — L578 Other skin changes due to chronic exposure to nonionizing radiation: Secondary | ICD-10-CM

## 2024-02-02 DIAGNOSIS — W908XXA Exposure to other nonionizing radiation, initial encounter: Secondary | ICD-10-CM | POA: Diagnosis not present

## 2024-02-02 DIAGNOSIS — L570 Actinic keratosis: Secondary | ICD-10-CM | POA: Diagnosis not present

## 2024-02-02 NOTE — Patient Instructions (Signed)

## 2024-02-02 NOTE — Progress Notes (Signed)
 Follow-Up Visit   Subjective  Jamie Byrd is a 89 y.o. male who presents for the following: Irregular skin lesions on the L cheek, L neck, and L hand, pt concerned and would like checked today.  The patient has spots, moles and lesions to be evaluated, some may be new or changing and the patient may have concern these could be cancer.   The following portions of the chart were reviewed this encounter and updated as appropriate: medications, allergies, medical history  Review of Systems:  No other skin or systemic complaints except as noted in HPI or Assessment and Plan.  Objective  Well appearing patient in no apparent distress; mood and affect are within normal limits.    A focused examination was performed of the following areas: the face, neck, and hands  Relevant exam findings are noted in the Assessment and Plan.  Left Dorsal Hand 7 mm keratotic papule  Left Angle of Mandible 6 mm keratotic papule   R dorsal hand x 10, R forearm x 2, L dorsal hand x 12, L forearm x 3 (27) Erythematous thin papules/macules with gritty scale.        Assessment & Plan   ACTINIC DAMAGE - chronic, secondary to cumulative UV radiation exposure/sun exposure over time - diffuse scaly erythematous macules with underlying dyspigmentation - Recommend daily broad spectrum sunscreen SPF 30+ to sun-exposed areas, reapply every 2 hours as needed.  - Recommend staying in the shade or wearing long sleeves, sun glasses (UVA+UVB protection) and wide brim hats (4-inch brim around the entire circumference of the hat). - Call for new or changing lesions.  Dilated open comedones Exam: cluster of comedones at left neck  Benign-appearing. Exam most consistent with an epidermal inclusion cyst. Discussed that a cyst is a benign growth that can grow over time and sometimes get irritated or inflamed. Recommend observation if it is not bothersome. Discussed option of surgical excision to remove it if  it is growing, symptomatic, or other changes noted. Please call for new or changing lesions so they can be evaluated.   NEOPLASM OF UNCERTAIN BEHAVIOR OF SKIN (2) Left Dorsal Hand Skin / nail biopsy Type of biopsy: tangential   Informed consent: discussed and consent obtained   Timeout: patient name, date of birth, surgical site, and procedure verified   Procedure prep:  Patient was prepped and draped in usual sterile fashion Prep type:  Isopropyl alcohol Anesthesia: the lesion was anesthetized in a standard fashion   Anesthetic:  1% lidocaine  w/ epinephrine 1-100,000 buffered w/ 8.4% NaHCO3 Instrument used: DermaBlade   Hemostasis achieved with: pressure and aluminum chloride   Outcome: patient tolerated procedure well   Post-procedure details: sterile dressing applied and wound care instructions given   Dressing type: bandage and petrolatum   Specimen 1 - Surgical pathology Differential Diagnosis: r/o SCC  Check Margins: No 7 mm keratotic papule Left Angle of Mandible Skin / nail biopsy Type of biopsy: tangential   Informed consent: discussed and consent obtained   Timeout: patient name, date of birth, surgical site, and procedure verified   Procedure prep:  Patient was prepped and draped in usual sterile fashion Prep type:  Isopropyl alcohol Anesthesia: the lesion was anesthetized in a standard fashion   Anesthetic:  1% lidocaine  w/ epinephrine 1-100,000 buffered w/ 8.4% NaHCO3 Instrument used: DermaBlade   Hemostasis achieved with: pressure and aluminum chloride   Outcome: patient tolerated procedure well   Post-procedure details: sterile dressing applied and wound care instructions  given   Dressing type: bandage and petrolatum   Specimen 2 - Surgical pathology Differential Diagnosis: r/o SCC  Check Margins: No 6 mm keratotic papule AK (ACTINIC KERATOSIS) (27) R dorsal hand x 10, R forearm x 2, L dorsal hand x 12, L forearm x 3 (27) Actinic keratoses are precancerous  spots that appear secondary to cumulative UV radiation exposure/sun exposure over time. They are chronic with expected duration over 1 year. A portion of actinic keratoses will progress to squamous cell carcinoma of the skin. It is not possible to reliably predict which spots will progress to skin cancer and so treatment is recommended to prevent development of skin cancer.  Recommend daily broad spectrum sunscreen SPF 30+ to sun-exposed areas, reapply every 2 hours as needed.  Recommend staying in the shade or wearing long sleeves, sun glasses (UVA+UVB protection) and wide brim hats (4-inch brim around the entire circumference of the hat). Call for new or changing lesions. Destruction of lesion - R dorsal hand x 10, R forearm x 2, L dorsal hand x 12, L forearm x 3 (27) Complexity: simple   Destruction method: cryotherapy   Informed consent: discussed and consent obtained   Timeout:  patient name, date of birth, surgical site, and procedure verified Lesion destroyed using liquid nitrogen: Yes   Region frozen until ice ball extended beyond lesion: Yes   Cryo cycles: 1 or 2. Outcome: patient tolerated procedure well with no complications   Post-procedure details: wound care instructions given   OPEN COMEDONE    Return for AK follow up, recheck thumb, with Dr. Annette Barters, as scheduled.  Jamie Byrd, RMA, am acting as scribe for Harris Liming, MD .   Documentation: I have reviewed the above documentation for accuracy and completeness, and I agree with the above.  Harris Liming, MD

## 2024-02-08 DIAGNOSIS — H353134 Nonexudative age-related macular degeneration, bilateral, advanced atrophic with subfoveal involvement: Secondary | ICD-10-CM | POA: Diagnosis not present

## 2024-02-08 LAB — SURGICAL PATHOLOGY

## 2024-02-10 ENCOUNTER — Telehealth: Payer: Self-pay

## 2024-02-10 DIAGNOSIS — C4492 Squamous cell carcinoma of skin, unspecified: Secondary | ICD-10-CM

## 2024-02-10 NOTE — Telephone Encounter (Signed)
 Advised pt of bx results.  Discussed mohs surgery for both areas.  Patient would like mohs referral sent to [facility] since he had been there in the past.

## 2024-02-10 NOTE — Telephone Encounter (Signed)
-----   Message from [facility] sent at 02/08/2024  9:17 PM EDT ----- Diagnosis: 1. Skin, left dorsal hand :       WELL DIFFERENTIATED SQUAMOUS CELL CARCINOMA        2. Skin, left angle of mandible :       SQUAMOUS CELL CARCINOMA, KERATOACANTHOMA TYPE    Please call with diagnosis and determine where the patient would like to have Mohs surgery.  L hand: Biopsy shows invasive SCC Treatment: Mohs  L cheek (angle of mandible) Biopsy shows invasive SCC Treatment: Mohs

## 2024-03-01 ENCOUNTER — Ambulatory Visit (INDEPENDENT_AMBULATORY_CARE_PROVIDER_SITE_OTHER)

## 2024-03-01 VITALS — BP 128/72 | Ht 71.0 in | Wt 200.0 lb

## 2024-03-01 DIAGNOSIS — Z0000 Encounter for general adult medical examination without abnormal findings: Secondary | ICD-10-CM | POA: Diagnosis not present

## 2024-03-01 DIAGNOSIS — Z2821 Immunization not carried out because of patient refusal: Secondary | ICD-10-CM

## 2024-03-01 NOTE — Progress Notes (Signed)
 Because this visit was a virtual/telehealth visit,  certain criteria was not obtained, such a blood pressure, CBG if applicable, and timed get up and go. Any medications not marked as "taking" were not mentioned during the medication reconciliation part of the visit. Any vitals not documented were not able to be obtained due to this being a telehealth visit or patient was unable to self-report a recent blood pressure reading due to a lack of equipment at home via telehealth. Vitals that have been documented are verbally provided by the patient.   This visit was performed by a medical professional under my direct supervision. I was immediately available for consultation/collaboration. I have reviewed and agree with the Annual Wellness Visit documentation.  Subjective:   Jamie Byrd is a 89 y.o. who presents for a Medicare Wellness preventive visit.  As a reminder, Annual Wellness Visits don't include a physical exam, and some assessments may be limited, especially if this visit is performed virtually. We may recommend an in-person follow-up visit with your provider if needed.  Visit Complete: Virtual I connected with  Jamie Byrd on 03/01/24 by a audio enabled telemedicine application and verified that I am speaking with the correct person using two identifiers.  Patient Location: Home  Provider Location: Home Office  I discussed the limitations of evaluation and management by telemedicine. The patient expressed understanding and agreed to proceed.  Vital Signs: Because this visit was a virtual/telehealth visit, some criteria may be missing or patient reported. Any vitals not documented were not able to be obtained and vitals that have been documented are patient reported.  VideoDeclined- This patient declined Librarian, academic. Therefore the visit was completed with audio only.  Persons Participating in Visit: Patient.  AWV Questionnaire: No: Patient  Medicare AWV questionnaire was not completed prior to this visit.  Cardiac Risk Factors include: advanced age (>86men, >60 women);male gender;hypertension     Objective:     Today's Vitals   03/01/24 1100  BP: 128/72  Weight: 200 lb (90.7 kg)  Height: 5\' 11"  (1.803 m)   Body mass index is 27.89 kg/m.     03/01/2024   11:04 AM 02/16/2023    8:11 AM 02/12/2022    8:37 AM 05/09/2019    2:03 PM 03/15/2019   10:07 AM 04/29/2018    9:41 AM 12/18/2017   10:04 AM  Advanced Directives  Does Patient Have a Medical Advance Directive? Yes Yes Yes No No No No  Type of Estate agent of [place];Living will Healthcare Power of [place];Living will Healthcare Power of [place];Living will      Does patient want to make changes to medical advance directive? No - Patient declined        Copy of Healthcare Power of Attorney in Chart? No - copy requested No - copy requested No - copy requested      Would patient like information on creating a medical advance directive?  No - Patient declined   No - Patient declined  Yes (MAU/Ambulatory/Procedural Areas - Information given)    Current Medications (verified) Outpatient Encounter Medications as of 03/01/2024  Medication Sig   allopurinol  (ZYLOPRIM ) 100 MG tablet TAKE ONE TABLET BY MOUTH ONCE A DAY   aspirin  81 MG EC tablet Take 1 tablet (81 mg total) by mouth daily. Swallow whole.   cyanocobalamin  (VITAMIN B12) 1000 MCG tablet Take 1,000 mcg by mouth daily.   levothyroxine  (SYNTHROID ) 88 MCG tablet TAKE ONE TABLET BY MOUTH ONCE  A DAY BEFORE BREAKFAST.   lisinopril  (ZESTRIL ) 10 MG tablet TAKE ONE TABLET BY MOUTH DAILY   metoprolol  succinate (TOPROL -XL) 25 MG 24 hr tablet Take 0.5 tablets (12.5 mg total) by mouth daily. Take with or immediately following a meal.   nitroGLYCERIN  (NITROSTAT ) 0.4 MG SL tablet Place 1 tablet (0.4 mg total) under the tongue every 5 (five) minutes as needed for chest pain.   rosuvastatin  (CRESTOR ) 20 MG tablet  TAKE ONE TABLET BY MOUTH ONCE A DAY   No facility-administered encounter medications on file as of 03/01/2024.    Allergies (verified) Patient has no known allergies.   History: Past Medical History:  Diagnosis Date   Actinic keratosis 10/28/2022   upper nasal dorsum, EDC   BCC (basal cell carcinoma) 09/12/2021   right neck posterior EDC 11/21/2021   BCC (basal cell carcinoma) 09/12/2021   right temple, EDC 02/18/2022   BCC (basal cell carcinoma) 09/12/2021   left temple, Mohs 12/18/21   BCC (basal cell carcinoma) 09/12/2021   left zygoma, Clear with biopsy 02/18/2022   BCC (basal cell carcinoma) 10/24/2021   right nasolabial fold, MOHs 01/01/2022   BPH (benign prostatic hypertrophy)    Difficulty reading    Difficulty seeing    Difficulty in seeing certain things and reading/ not legally blind!   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   right neck anterior, well differentiated, base involved EDC 11/21/2021   Squamous cell carcinoma of skin 11/21/2021   right dorsal hand, EDC   Squamous cell carcinoma of skin 04/21/2022   R med cheek, Atypical Squamous Proliferation w/Cystic Change, exc 06/09/2022  Mohs surgery for a recurrent moderately differentiated SCC R med cheek 07/23/22   Squamous cell carcinoma of skin 02/02/2024   Left Dorsal Hand, referral for mohs sent to [facility]   Squamous cell carcinoma of skin 02/02/2024   Left Angle of Mandible, referral for mohs sent to [facility]   Past Surgical History:  Procedure Laterality Date   COLONOSCOPY     CORONARY ATHERECTOMY N/A 09/26/2021   Procedure: CORONARY ATHERECTOMY;  Surgeon: Cody Das, MD;  Location: [facility];  Service: Cardiovascular;  Laterality: N/A;   CORONARY IMAGING/OCT N/A 09/26/2021   Procedure: INTRAVASCULAR IMAGING/OCT;  Surgeon: Cody Das, MD;  Location:  [facility];  Service: Cardiovascular;  Laterality: N/A;   CORONARY STENT INTERVENTION N/A 09/26/2021   Procedure: CORONARY STENT INTERVENTION;  Surgeon: Cody Das, MD;  Location: [facility];  Service: Cardiovascular;  Laterality: N/A;   LEFT HEART CATH AND CORONARY ANGIOGRAPHY N/A 09/25/2021   Procedure: LEFT HEART CATH AND CORONARY ANGIOGRAPHY;  Surgeon: Cody Das, MD;  Location: [facility];  Service: Cardiovascular;  Laterality: N/A;   POLYPECTOMY     Family History  Problem Relation Age of Onset   Heart disease Mother        CABG   Heart disease Father        CAD   Cancer Father        lung CA   Cancer Brother        lung CA   Social History   Socioeconomic History   Marital status: Widowed    Spouse name: Aurora Blowers   Number of children: 1   Years of education: Not on  file   Highest education level: Not on file  Occupational History   Occupation: retired  Tobacco Use   Smoking status: Former    Current packs/day: 0.00    Average packs/day: 1.5 packs/day for 15.0 years (22.5 ttl pk-yrs)    Types: Cigarettes    Start date: 10/13/1953    Quit date: 10/13/1968    Years since quitting: 55.4   Smokeless tobacco: Never  Vaping Use   Vaping status: Never Used  Substance and Sexual Activity   Alcohol use: No    Comment: no alcohol in 20 years   Drug use: No   Sexual activity: Not Currently  Other Topics Concern   Not on file  Social History Narrative   Had one son who passed away in MVA at age 68   He and his wife are retired and disabled, he is blind and she has dementia - his sister drives them to appointments and shopping, etc    Social Drivers of Health   Financial Resource Strain: Low Risk  (03/01/2024)   Overall Financial Resource Strain (CARDIA)    Difficulty of Paying Living Expenses: Not hard at all  Food Insecurity: No Food Insecurity (03/01/2024)   Hunger Vital Sign    Worried About Running Out of Food in the Last Year:  Never true    Ran Out of Food in the Last Year: Never true  Transportation Needs: No Transportation Needs (03/01/2024)   PRAPARE - Administrator, Civil Service (Medical): No    Lack of Transportation (Non-Medical): No  Physical Activity: Sufficiently Active (03/01/2024)   Exercise Vital Sign    Days of Exercise per Week: 5 days    Minutes of Exercise per Session: 70 min  Stress: No Stress Concern Present (03/01/2024)   [employer] of Occupational Health - Occupational Stress Questionnaire    Feeling of Stress : Not at all  Social Connections: Socially Isolated (03/01/2024)   Social Connection and Isolation Panel [NHANES]    Frequency of Communication with Friends and Family: Twice a week    Frequency of Social Gatherings with Friends and Family: Twice a week    Attends Religious Services: Never    Database administrator or Organizations: No    Attends Banker Meetings: Never    Marital Status: Widowed    Tobacco Counseling Counseling given: Not Answered    Clinical Intake:  Pre-visit preparation completed: Yes  Pain : No/denies pain     BMI - recorded: 27.89 Nutritional Status: BMI 25 -29 Overweight Nutritional Risks: None Diabetes: No  Lab Results  Component Value Date   HGBA1C 5.4 08/05/2022   HGBA1C 5.4 05/26/2022   HGBA1C 5.3 05/23/2021     How often do you need to have someone help you when you read instructions, pamphlets, or other written materials from your doctor or pharmacy?: 1 - Never What is the last grade level you completed in school?: 5th grade  Interpreter Needed?: No  Information entered by :: Keyauna Graefe,cma   Activities of Daily Living     03/01/2024   11:02 AM  In your present state of health, do you have any difficulty performing the following activities:  Hearing? 0  Vision? 0  Difficulty concentrating or making decisions? 0  Walking or climbing stairs? 0  Dressing or bathing? 0  Doing errands,  shopping? 0  Preparing Food and eating ? N  Using the Toilet? N  In the past six months, have you  accidently leaked urine? Y  Do you have problems with loss of bowel control? N  Managing your Medications? N  Managing your Finances? N  Housekeeping or managing your Housekeeping? N    Patient Care Team: Tower, Manley Seeds, MD as PCP - General Olinda Bertrand, DO as PCP - Cardiology (Cardiology) Harlen Lick, MD as Consulting Physician (Dermatology) Amada Jun, MD as Referring Physician (Ophthalmology)  Indicate any recent Medical Services you may have received from other than [facility] providers in the past year (date may be approximate).     Assessment:    This is a routine wellness examination for Jamie Byrd.  Hearing/Vision screen Hearing Screening - Comments:: No hearing difficulties Vision Screening - Comments:: Some vision difficulties   Goals Addressed             This Visit's Progress    Patient Stated       Patient continue to live life       Depression Screen     03/01/2024   11:05 AM 06/12/2023   10:32 AM 05/29/2023    8:43 AM 02/16/2023    8:10 AM 05/26/2022    9:34 AM 02/12/2022    8:32 AM 05/23/2021    9:20 AM  PHQ 2/9 Scores  PHQ - 2 Score 0 0 0 0 0 0 0  PHQ- 9 Score 0 0 0        Fall Risk     03/01/2024   11:04 AM 06/12/2023   10:32 AM 05/29/2023    8:42 AM 02/16/2023    8:06 AM 05/26/2022    9:34 AM  Fall Risk   Falls in the past year? 0 0 0 0 0  Number falls in past yr: 0 0 0 0   Injury with Fall? 0 0 0 0   Risk for fall due to : No Fall Risks No Fall Risks No Fall Risks Impaired vision   Risk for fall due to: Comment    Macular degeneration   Follow up Falls prevention discussed;Falls evaluation completed Falls evaluation completed Falls evaluation completed Falls prevention discussed;Falls evaluation completed;Education provided Falls evaluation completed    MEDICARE RISK AT HOME:  Medicare Risk at Home Any stairs in or around the home?: Yes If so, are  there any without handrails?: No Home free of loose throw rugs in walkways, pet beds, electrical cords, etc?: Yes Adequate lighting in your home to reduce risk of falls?: Yes Life alert?: No Use of a cane, walker or w/c?: No Grab bars in the bathroom?: Yes Shower chair or bench in shower?: Yes Elevated toilet seat or a handicapped toilet?: Yes  TIMED UP AND GO:  Was the test performed?  No  Cognitive Function: 6CIT completed    05/09/2019    2:08 PM 12/18/2017   10:10 AM 12/17/2016    9:30 AM 12/13/2015   10:19 AM  MMSE - Mini Mental State Exam  Orientation to time 5 5 5 5   Orientation to Place 5 5 5 5   Registration 3 3 3 3   Attention/ Calculation 0 0 0 0  Attention/Calculation-comments Patient can not spell     Recall 3 3 3  0  Language- name 2 objects 0 0 0 0  Language- repeat 1 1 1 1   Language- follow 3 step command 0 3 3 3   Language- read & follow direction 0 0 0 1  Write a sentence 0 0 0 0  Copy design 0 0 0 0  Total score 17 20  20 18        03/01/2024   11:01 AM 02/16/2023    8:14 AM 02/12/2022    8:28 AM  6CIT Screen  What Year? 0 points 0 points 0 points  What month? 0 points 0 points 0 points  What time? 0 points 0 points 0 points  Count back from 20 0 points 0 points 0 points  Months in reverse 0 points 0 points 4 points  Repeat phrase 0 points 0 points 2 points  Total Score 0 points 0 points 6 points    Immunizations Immunization History  Administered Date(s) Administered   Fluad Quad(high Dose 65+) 07/15/2019, 07/19/2020, 10/03/2022   Fluad Trivalent(High Dose 65+) 10/01/2023   Influenza Split 09/17/2011, 07/23/2012   Influenza Whole 08/25/2006, 09/10/2009, 09/25/2010   Influenza, High Dose Seasonal PF 07/29/2018   Influenza, Seasonal, Injecte, Preservative Fre 06/28/2015   Influenza,inj,Quad PF,6+ Mos 07/07/2013, 07/19/2014, 08/08/2016, 07/16/2017   Influenza-Unspecified 06/28/2015   PFIZER(Purple Top)SARS-COV-2 Vaccination 12/06/2019, 12/27/2019    Pneumococcal Conjugate-13 12/11/2014   Pneumococcal Polysaccharide-23 09/17/2011   Td 07/14/1999, 09/10/2009    Screening Tests Health Maintenance  Topic Date Due   Zoster Vaccines- Shingrix (1 of 2) Never done   DTaP/Tdap/Td (3 - Tdap) 09/11/2019   COVID-19 Vaccine (3 - Pfizer risk series) 01/24/2020   INFLUENZA VACCINE  05/13/2024   Medicare Annual Wellness (AWV)  03/01/2025   Pneumonia Vaccine 57+ Years old  Completed   HPV VACCINES  Aged Out   Meningococcal B Vaccine  Aged Out    Health Maintenance  Health Maintenance Due  Topic Date Due   Zoster Vaccines- Shingrix (1 of 2) Never done   DTaP/Tdap/Td (3 - Tdap) 09/11/2019   COVID-19 Vaccine (3 - Pfizer risk series) 01/24/2020   Health Maintenance Items Addressed:   Additional Screening:  Vision Screening: Recommended annual ophthalmology exams for early detection of glaucoma and other disorders of the eye.  Dental Screening: Recommended annual dental exams for proper oral hygiene  Community Resource Referral / Chronic Care Management: CRR required this visit?  No   CCM required this visit?  No   Plan:    I have personally reviewed and noted the following in the patient's chart:   Medical and social history Use of alcohol, tobacco or illicit drugs  Current medications and supplements including opioid prescriptions. Patient is not currently taking opioid prescriptions. Functional ability and status Nutritional status Physical activity Advanced directives List of other physicians Hospitalizations, surgeries, and ER visits in previous 12 months Vitals Screenings to include cognitive, depression, and falls Referrals and appointments  In addition, I have reviewed and discussed with patient certain preventive protocols, quality metrics, and best practice recommendations. A written personalized care plan for preventive services as well as general preventive health recommendations were provided to  patient.   Freeda Jerry, [place]   03/01/2024   After Visit Summary: (MyChart) Due to this being a telephonic visit, the after visit summary with patients personalized plan was offered to patient via MyChart   Notes: Nothing significant to report at this time.

## 2024-03-01 NOTE — Patient Instructions (Signed)
 Jamie Byrd , Thank you for taking time out of your busy schedule to complete your Annual Wellness Visit with me. I enjoyed our conversation and look forward to speaking with you again next year. I, as well as your care team,  appreciate your ongoing commitment to your health goals. Please review the following plan we discussed and let me know if I can assist you in the future. Your Game plan/ To Do List    Referrals: If you haven't heard from the office you've been referred to, please reach out to them at the phone provided.   Follow up Visits: Next Medicare AWV with our clinical staff: 05/ 21/2026 Have you seen your provider in the last 6 months (3 months if uncontrolled diabetes)? [name] Next Office Visit with your provider: 05/30/2024  Clinician Recommendations:  Aim for 30 minutes of exercise or brisk walking, 6-8 glasses of water, and 5 servings of fruits and vegetables each day.       This is a list of the screening recommended for you and due dates:  Health Maintenance  Topic Date Due   Zoster (Shingles) Vaccine (1 of 2) Never done   DTaP/Tdap/Td vaccine (3 - Tdap) 09/11/2019   COVID-19 Vaccine (3 - Pfizer risk series) 01/24/2020   Flu Shot  05/13/2024   Medicare Annual Wellness Visit  03/01/2025   Pneumonia Vaccine  Completed   HPV Vaccine  Aged Out   Meningitis B Vaccine  Aged Out    Advanced directives: (Declined) Advance directive discussed with you today. Even though you declined this today, please call our office should you change your mind, and we can give you the proper paperwork for you to fill out. Advance Care Planning is important because it:  [x]  Makes sure you receive the medical care that is consistent with your values, goals, and preferences  [x]  It provides guidance to your family and loved ones and reduces their decisional burden about whether or not they are making the right decisions based on your wishes.  Follow the link provided in your after visit  summary or read over the paperwork we have mailed to you to help you started getting your Advance Directives in place. If you need assistance in completing these, please reach out to us  so that we can help you!  See attachments for Preventive Care and Fall Prevention Tips.

## 2024-03-10 ENCOUNTER — Ambulatory Visit: Payer: Self-pay | Admitting: Cardiology

## 2024-03-21 DIAGNOSIS — H353134 Nonexudative age-related macular degeneration, bilateral, advanced atrophic with subfoveal involvement: Secondary | ICD-10-CM | POA: Diagnosis not present

## 2024-04-06 ENCOUNTER — Other Ambulatory Visit: Payer: Self-pay | Admitting: Family Medicine

## 2024-05-09 DIAGNOSIS — H35433 Paving stone degeneration of retina, bilateral: Secondary | ICD-10-CM | POA: Diagnosis not present

## 2024-05-09 DIAGNOSIS — H35423 Microcystoid degeneration of retina, bilateral: Secondary | ICD-10-CM | POA: Diagnosis not present

## 2024-05-09 DIAGNOSIS — H31092 Other chorioretinal scars, left eye: Secondary | ICD-10-CM | POA: Diagnosis not present

## 2024-05-09 DIAGNOSIS — H353134 Nonexudative age-related macular degeneration, bilateral, advanced atrophic with subfoveal involvement: Secondary | ICD-10-CM | POA: Diagnosis not present

## 2024-05-09 DIAGNOSIS — H43813 Vitreous degeneration, bilateral: Secondary | ICD-10-CM | POA: Diagnosis not present

## 2024-05-09 DIAGNOSIS — H35033 Hypertensive retinopathy, bilateral: Secondary | ICD-10-CM | POA: Diagnosis not present

## 2024-05-09 DIAGNOSIS — Z961 Presence of intraocular lens: Secondary | ICD-10-CM | POA: Diagnosis not present

## 2024-05-10 ENCOUNTER — Ambulatory Visit: Payer: Medicare Other | Admitting: Dermatology

## 2024-05-10 ENCOUNTER — Encounter: Payer: Self-pay | Admitting: Dermatology

## 2024-05-10 DIAGNOSIS — L578 Other skin changes due to chronic exposure to nonionizing radiation: Secondary | ICD-10-CM

## 2024-05-10 DIAGNOSIS — W908XXA Exposure to other nonionizing radiation, initial encounter: Secondary | ICD-10-CM

## 2024-05-10 DIAGNOSIS — D485 Neoplasm of uncertain behavior of skin: Secondary | ICD-10-CM

## 2024-05-10 DIAGNOSIS — C44629 Squamous cell carcinoma of skin of left upper limb, including shoulder: Secondary | ICD-10-CM

## 2024-05-10 DIAGNOSIS — Z85828 Personal history of other malignant neoplasm of skin: Secondary | ICD-10-CM

## 2024-05-10 DIAGNOSIS — D0462 Carcinoma in situ of skin of left upper limb, including shoulder: Secondary | ICD-10-CM | POA: Diagnosis not present

## 2024-05-10 DIAGNOSIS — L570 Actinic keratosis: Secondary | ICD-10-CM | POA: Diagnosis not present

## 2024-05-10 HISTORY — DX: Carcinoma in situ of skin of left upper limb, including shoulder: D04.62

## 2024-05-10 NOTE — Patient Instructions (Addendum)

## 2024-05-10 NOTE — Progress Notes (Signed)
 Follow-Up Visit   Subjective  Jamie Byrd is a 89 y.o. male who presents for the following: Actinic keratosis.  The patient has spots, moles and lesions to be evaluated, some may be new or changing, including scalp. 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 Recheck AK R thumb proximal nail fold.   The following portions of the chart were reviewed this encounter and updated as appropriate: medications, allergies, medical history  Review of Systems:  No other skin or systemic complaints except as noted in HPI or Assessment and Plan.  Objective  Well appearing patient in no apparent distress; mood and affect are within normal limits.  A focused examination was performed of the following areas: Face, scalp, arms, hands  Relevant exam findings are noted in the Assessment and Plan.  Left mid dorsal hand adjacent to scar 7 mm pink keratotic papule    R thumb proximal nail fold (residual) x 1, L antihelix x 3, L ear helix x 1, frontal scalp x 1, mid crown x 1, L hand dorsum x 3, R hand dorsum x 8, L elbow x 1, L wrist x 1 (20) Keratotic papules.  nasal dorsum x 1, R zygoma x 2, R forehead x 1, R cheek x 1 (5) Pink scaly macules  Assessment & Plan   NEOPLASM OF UNCERTAIN BEHAVIOR OF SKIN Left mid dorsal hand adjacent to scar Epidermal / dermal shaving  Lesion diameter (cm):  0.7 Informed consent: discussed and consent obtained   Patient was prepped and draped in usual sterile fashion: Area prepped with alcohol. Anesthesia: the lesion was anesthetized in a standard fashion   Anesthetic:  1% lidocaine  w/ epinephrine 1-100,000 buffered w/ 8.4% NaHCO3 Instrument used: flexible razor blade   Hemostasis achieved with: pressure, aluminum chloride and electrodesiccation   Outcome: patient tolerated procedure well    Destruction of lesion  Destruction method: electrodesiccation and curettage   Informed consent: discussed and  consent obtained   Curettage performed in three different directions: Yes   Electrodesiccation performed over the curetted area: Yes   Final wound size (cm):  0.7 Hemostasis achieved with:  pressure, aluminum chloride and electrodesiccation Outcome: patient tolerated procedure well with no complications   Post-procedure details: wound care instructions given   Post-procedure details comment:  Ointment and bandage applied.  Specimen 1 - Surgical pathology Differential Diagnosis: Hypertrophic AK r/o SCC Check Margins: No EDC today HYPERTROPHIC ACTINIC KERATOSIS (20) R thumb proximal nail fold (residual) x 1, L antihelix x 3, L ear helix x 1, frontal scalp x 1, mid crown x 1, L hand dorsum x 3, R hand dorsum x 8, L elbow x 1, L wrist x 1 (20) Actinic keratoses are precancerous spots that appear secondary to cumulative UV radiation exposure/sun exposure over time. They are chronic with expected duration over 1 year. A portion of actinic keratoses will progress to squamous cell carcinoma of the skin. It is not possible to reliably predict which spots will progress to skin cancer and so treatment is recommended to prevent development of skin cancer.  Recommend daily broad spectrum sunscreen SPF 30+ to sun-exposed areas, reapply every 2 hours as needed.  Recommend staying in the shade or wearing long sleeves, sun glasses (UVA+UVB protection) and wide brim hats (4-inch brim around the entire circumference of the hat). Call for new or changing lesions. Destruction of lesion - R thumb proximal nail fold (residual) x 1, L antihelix x  3, L ear helix x 1, frontal scalp x 1, mid crown x 1, L hand dorsum x 3, R hand dorsum x 8, L elbow x 1, L wrist x 1 (20)  Destruction method: cryotherapy   Informed consent: discussed and consent obtained   Lesion destroyed using liquid nitrogen: Yes   Region frozen until ice ball extended beyond lesion: Yes   Outcome: patient tolerated procedure well with no  complications   Post-procedure details: wound care instructions given   Additional details:  Prior to procedure, discussed risks of blister formation, small wound, skin dyspigmentation, or rare scar following cryotherapy. Recommend Vaseline ointment to treated areas while healing.   AK (ACTINIC KERATOSIS) (5) nasal dorsum x 1, R zygoma x 2, R forehead x 1, R cheek x 1 (5) Actinic keratoses are precancerous spots that appear secondary to cumulative UV radiation exposure/sun exposure over time. They are chronic with expected duration over 1 year. A portion of actinic keratoses will progress to squamous cell carcinoma of the skin. It is not possible to reliably predict which spots will progress to skin cancer and so treatment is recommended to prevent development of skin cancer.  Recommend daily broad spectrum sunscreen SPF 30+ to sun-exposed areas, reapply every 2 hours as needed.  Recommend staying in the shade or wearing long sleeves, sun glasses (UVA+UVB protection) and wide brim hats (4-inch brim around the entire circumference of the hat). Call for new or changing lesions. Destruction of lesion - nasal dorsum x 1, R zygoma x 2, R forehead x 1, R cheek x 1 (5)  Destruction method: cryotherapy   Informed consent: discussed and consent obtained   Lesion destroyed using liquid nitrogen: Yes   Region frozen until ice ball extended beyond lesion: Yes   Outcome: patient tolerated procedure well with no complications   Post-procedure details: wound care instructions given   Additional details:  Prior to procedure, discussed risks of blister formation, small wound, skin dyspigmentation, or rare scar following cryotherapy. Recommend Vaseline ointment to treated areas while healing.      ACTINIC DAMAGE - chronic, secondary to cumulative UV radiation exposure/sun exposure over time - diffuse scaly erythematous macules with underlying dyspigmentation - Recommend daily broad spectrum sunscreen SPF  30+ to sun-exposed areas, reapply every 2 hours as needed.  - Recommend staying in the shade or wearing long sleeves, sun glasses (UVA+UVB protection) and wide brim hats (4-inch brim around the entire circumference of the hat). - Call for new or changing lesions.   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 - No evidence of recurrence today - Recommend regular full body skin exams - Recommend daily broad spectrum sunscreen SPF 30+ to sun-exposed areas, reapply every 2 hours as needed.  - Call if any new or changing lesions are noted between office visits  HISTORY OF BASAL CELL CARCINOMA OF THE SKIN, Multiple, see history - No evidence of recurrence today - Recommend regular full body skin exams - Recommend daily broad spectrum sunscreen SPF 30+ to sun-exposed areas, reapply every 2 hours as needed.  - Call if any new or changing lesions are noted between office visits    Return in about 6 months (around 11/10/2024) for UBSE, Hx BCC, Hx SCC, Hx AKs.  LILLETTE Andrea Kerns, CMA, am acting as scribe for Rexene Rattler, MD .   Documentation: I have reviewed the above documentation for accuracy and completeness, and  I agree with the above.  Rexene Rattler, MD

## 2024-05-16 LAB — SURGICAL PATHOLOGY

## 2024-05-18 ENCOUNTER — Ambulatory Visit: Payer: Self-pay | Admitting: Dermatology

## 2024-05-18 ENCOUNTER — Encounter: Payer: Self-pay | Admitting: Dermatology

## 2024-05-18 NOTE — Telephone Encounter (Signed)
-----   Message from Rexene Rattler sent at 05/18/2024  1:43 PM EDT ----- 1. Skin, left mid dorsal hand adjacent to scar :       SQUAMOUS CELL CARCINOMA IN SITU, HYPERTROPHIC, BASE INVOLVED  SCCIS skin cancer- already treated with EDC at time of biopsy   - please call patient ----- Message ----- From: Interface, Lab In [facility] Sent: 05/16/2024   5:35 PM EDT To: Rexene Rattler, MD

## 2024-05-18 NOTE — Telephone Encounter (Signed)
 Advised patient biopsy of the left mid dorsal hand adj to scar was SCC in situ and has already been treated with EDC. Patient to keep f/u.

## 2024-05-26 ENCOUNTER — Other Ambulatory Visit: Payer: Self-pay | Admitting: Family Medicine

## 2024-05-30 ENCOUNTER — Encounter: Payer: Medicare Other | Admitting: Family Medicine

## 2024-06-01 ENCOUNTER — Encounter: Payer: Self-pay | Admitting: Family Medicine

## 2024-06-01 ENCOUNTER — Ambulatory Visit: Payer: Self-pay | Admitting: Family Medicine

## 2024-06-01 ENCOUNTER — Ambulatory Visit (INDEPENDENT_AMBULATORY_CARE_PROVIDER_SITE_OTHER): Admitting: Family Medicine

## 2024-06-01 VITALS — BP 110/70 | HR 56 | Temp 98.2°F | Ht 70.5 in | Wt 195.4 lb

## 2024-06-01 DIAGNOSIS — D696 Thrombocytopenia, unspecified: Secondary | ICD-10-CM | POA: Diagnosis not present

## 2024-06-01 DIAGNOSIS — E538 Deficiency of other specified B group vitamins: Secondary | ICD-10-CM | POA: Diagnosis not present

## 2024-06-01 DIAGNOSIS — R7989 Other specified abnormal findings of blood chemistry: Secondary | ICD-10-CM | POA: Diagnosis not present

## 2024-06-01 DIAGNOSIS — E786 Lipoprotein deficiency: Secondary | ICD-10-CM | POA: Diagnosis not present

## 2024-06-01 DIAGNOSIS — M1A9XX0 Chronic gout, unspecified, without tophus (tophi): Secondary | ICD-10-CM | POA: Diagnosis not present

## 2024-06-01 DIAGNOSIS — D649 Anemia, unspecified: Secondary | ICD-10-CM | POA: Diagnosis not present

## 2024-06-01 DIAGNOSIS — R945 Abnormal results of liver function studies: Secondary | ICD-10-CM | POA: Insufficient documentation

## 2024-06-01 DIAGNOSIS — N400 Enlarged prostate without lower urinary tract symptoms: Secondary | ICD-10-CM | POA: Diagnosis not present

## 2024-06-01 DIAGNOSIS — I10 Essential (primary) hypertension: Secondary | ICD-10-CM

## 2024-06-01 DIAGNOSIS — E039 Hypothyroidism, unspecified: Secondary | ICD-10-CM

## 2024-06-01 DIAGNOSIS — R7303 Prediabetes: Secondary | ICD-10-CM

## 2024-06-01 LAB — LIPID PANEL
Cholesterol: 74 mg/dL (ref 0–200)
HDL: 28.7 mg/dL — ABNORMAL LOW (ref >39.00)
LDL Cholesterol: 29 mg/dL (ref 0–99)
NonHDL: 44.95
Total CHOL/HDL Ratio: 3
Triglycerides: 78 mg/dL (ref 0.0–149.0)
VLDL: 15.6 mg/dL (ref 0.0–40.0)

## 2024-06-01 LAB — CBC WITH DIFFERENTIAL/PLATELET
Basophils Absolute: 0 K/uL (ref 0.0–0.1)
Basophils Relative: 0.5 % (ref 0.0–3.0)
Eosinophils Absolute: 0.4 K/uL (ref 0.0–0.7)
Eosinophils Relative: 6.5 % — ABNORMAL HIGH (ref 0.0–5.0)
HCT: 40.1 % (ref 39.0–52.0)
Hemoglobin: 13.1 g/dL (ref 13.0–17.0)
Lymphocytes Relative: 33.1 % (ref 12.0–46.0)
Lymphs Abs: 2.2 K/uL (ref 0.7–4.0)
MCHC: 32.7 g/dL (ref 30.0–36.0)
MCV: 99 fl (ref 78.0–100.0)
Monocytes Absolute: 0.5 K/uL (ref 0.1–1.0)
Monocytes Relative: 7.2 % (ref 3.0–12.0)
Neutro Abs: 3.5 K/uL (ref 1.4–7.7)
Neutrophils Relative %: 52.7 % (ref 43.0–77.0)
Platelets: 365 K/uL (ref 150.0–400.0)
RBC: 4.05 Mil/uL — ABNORMAL LOW (ref 4.22–5.81)
RDW: 15 % (ref 11.5–15.5)
WBC: 6.7 K/uL (ref 4.0–10.5)

## 2024-06-01 LAB — COMPREHENSIVE METABOLIC PANEL WITH GFR
ALT: 11 U/L (ref 0–53)
AST: 23 U/L (ref 0–37)
Albumin: 4.4 g/dL (ref 3.5–5.2)
Alkaline Phosphatase: 104 U/L (ref 39–117)
BUN: 13 mg/dL (ref 6–23)
CO2: 30 meq/L (ref 19–32)
Calcium: 9.5 mg/dL (ref 8.4–10.5)
Chloride: 102 meq/L (ref 96–112)
Creatinine, Ser: 0.87 mg/dL (ref 0.40–1.50)
GFR: 77.47 mL/min (ref >60.00)
Glucose, Bld: 91 mg/dL (ref 70–99)
Potassium: 4.6 meq/L (ref 3.5–5.1)
Sodium: 138 meq/L (ref 135–145)
Total Bilirubin: 0.9 mg/dL (ref 0.2–1.2)
Total Protein: 6.8 g/dL (ref 6.0–8.3)

## 2024-06-01 LAB — URIC ACID: Uric Acid, Serum: 4.2 mg/dL (ref 4.0–7.8)

## 2024-06-01 LAB — VITAMIN B12: Vitamin B-12: 338 pg/mL (ref 211–911)

## 2024-06-01 LAB — HEMOGLOBIN A1C: Hgb A1c MFr Bld: 5.5 % (ref 4.6–6.5)

## 2024-06-01 LAB — IRON: Iron: 121 ug/dL (ref 42–165)

## 2024-06-01 LAB — TSH: TSH: 4.74 u[IU]/mL (ref 0.35–5.50)

## 2024-06-01 LAB — FERRITIN: Ferritin: 260.4 ng/mL (ref 22.0–322.0)

## 2024-06-01 MED ORDER — LEVOTHYROXINE SODIUM 88 MCG PO TABS: 88.0000 ug | ORAL_TABLET | Freq: Every day | ORAL | 3 refills | Status: AC

## 2024-06-01 NOTE — Progress Notes (Signed)
 Subjective:    Patient ID: Jamie Byrd, male    DOB: [birth year], 89 y.o.   MRN: [id number]  HPI  Pt presents for annual follow up of chronic medical problems   Wt Readings from Last 3 Encounters:  06/01/24 195 lb 6 oz (88.6 kg)  03/01/24 200 lb (90.7 kg)  01/05/24 202 lb (91.6 kg)   27.64 kg/m  Vitals:   06/01/24 0839  BP: 110/70  Pulse: (!) 56  Temp: 98.2 F (36.8 C)  SpO2: 98%    Immunization History  Administered Date(s) Administered   Fluad Quad(high Dose 65+) 07/15/2019, 07/19/2020, 10/03/2022   Fluad Trivalent(High Dose 65+) 10/01/2023   Influenza Split 09/17/2011, 07/23/2012   Influenza Whole 08/25/2006, 09/10/2009, 09/25/2010   Influenza, High Dose Seasonal PF 07/29/2018   Influenza, Seasonal, Injecte, Preservative Fre 06/28/2015   Influenza,inj,Quad PF,6+ Mos 07/07/2013, 07/19/2014, 08/08/2016, 07/16/2017   Influenza-Unspecified 06/28/2015   PFIZER(Purple Top)SARS-COV-2 Vaccination 12/06/2019, 12/27/2019   Pneumococcal Conjugate-13 12/11/2014   Pneumococcal Polysaccharide-23 09/17/2011   Td 07/14/1999, 09/10/2009    Health Maintenance Due  Topic Date Due   DTaP/Tdap/Td (3 - Tdap) 09/11/2019   Doing ok overall  Feels fair   Vision is poor / not driving which frustrates him Hearing is about the same    Is due for tetanus shot   Flu shot due in the fall  Shingrix -declines     Prostate health Lab Results  Component Value Date   PSA 0.59 12/18/2017   PSA 0.56 12/17/2016   PSA 0.48 12/13/2015  History of BPH Some leakage of urine recently  No dysuria  No trouble emptying    Colon cancer screening -out aged   Bone health   Falls-none Fractures-none  Supplements no vit D   (B12 and eye vitamin)    Exercise  Works outside Mowing/gardening  Very active    Mood    03/01/2024   11:05 AM 06/12/2023   10:32 AM 05/29/2023    8:43 AM 02/16/2023    8:10 AM 05/26/2022    9:34 AM  Depression screen PHQ 2/9  Decreased Interest 0 0  0 0 0  Down, Depressed, Hopeless 0 0 0 0 0  PHQ - 2 Score 0 0 0 0 0  Altered sleeping 0 0 0    Tired, decreased energy 0 0 0    Change in appetite 0 0 0    Feeling bad or failure about yourself  0 0 0    Trouble concentrating 0 0 0    Moving slowly or fidgety/restless 0 0 0    Suicidal thoughts 0 0 0    PHQ-9 Score 0 0 0    Difficult doing work/chores Not difficult at all Not difficult at all Not difficult at all      Getting shots in eyes for macular degeneration  Goes every 6 weeks   HTN bp is stable today  No cp or palpitations or headaches or edema  No side effects to medicines  BP Readings from Last 3 Encounters:  06/01/24 110/70  03/01/24 128/72  01/05/24 128/72    Pulse Readings from Last 3 Encounters:  06/01/24 (!) 56  01/05/24 66  08/26/23 65     Lab Results  Component Value Date   NA 137 06/05/2023   K 5.1 06/05/2023   CO2 29 06/05/2023   GLUCOSE 102 (H) 06/05/2023   BUN 14 06/05/2023   CREATININE 0.97 06/05/2023   CALCIUM  9.8 06/05/2023   GFR 70.58  06/05/2023   GFRNONAA >60 09/27/2021   Lisinopril  10 mg daily  Metoprolol  xl 12.5 mg daily  Under cardiology care for CAD On asa   Hypothyroidism  Pt has no clinical changes No change in energy level/ hair or skin/ edema and no tremor Lab Results  Component Value Date   TSH 3.14 05/29/2023    Levothyroxine  88 mcg daily  Due for labs    History of low plt count d anemia in past   Lab Results  Component Value Date   WBC 5.4 08/14/2023   HGB 12.7 (L) 08/14/2023   HCT 39.5 08/14/2023   MCV 102.3 (H) 08/14/2023   PLT 127.0 (L) 08/14/2023   Lab Results  Component Value Date   IRON 131 08/14/2023   TIBC 266.0 08/06/2022   FERRITIN 269.7 08/14/2023   B12 def Lab Results  Component Value Date   VITAMINB12 303 08/14/2023   Has had shots  Then oral supplementation    History of gout  Takes allopurinol  100 mg daily   Hyperlipidemia Lab Results  Component Value Date   CHOL 73  05/29/2023   HDL 27.40 (L) 05/29/2023   LDLCALC 32 05/29/2023   TRIG 68.0 05/29/2023   CHOLHDL 3 05/29/2023   Rosuvastatin  20 mg daily  Low HDL is baseline Due for labs  Prediabetes Lab Results  Component Value Date   HGBA1C 5.4 08/05/2022   HGBA1C 5.4 05/26/2022   HGBA1C 5.3 05/23/2021   Due for labs   Diet is fair  Since he is alone- a lot of sandwiches  [employer] out twice weekly - steak potato/beans      Patient Active Problem List   Diagnosis Date Noted   Liver function study, abnormal 06/01/2024   Vitamin B12 deficiency 06/12/2023   Macrocytosis 06/01/2023   Hyponatremia 06/01/2023   Anemia 08/25/2022   Elevated ferritin 08/25/2022   Enlarged prostate 08/08/2022   Bladder wall thickening 08/08/2022   Microscopic hematuria 07/07/2022   Urgency of urination 07/07/2022   Flank pain 07/07/2022   CAD (coronary artery disease) 05/26/2022   Constipation 05/26/2022   Thrombocytopenia (HCC) 05/26/2022   Low back pain 05/21/2018   Macular degeneration 12/18/2017   Hearing loss of aging 12/09/2013   Encounter for Medicare annual wellness exam 12/09/2013   History of colonic polyps 12/08/2012   Prostate cancer screening 09/10/2011   Prediabetes 09/25/2010   Low HDL (under 40) 11/05/2009   Hypothyroidism 07/28/2007   Gout 07/28/2007   ERECTILE DYSFUNCTION 07/28/2007   Primary hypertension 07/28/2007   Benign prostatic hyperplasia 07/28/2007   Past Medical History:  Diagnosis Date   Actinic keratosis 10/28/2022   upper nasal dorsum, EDC   BCC (basal cell carcinoma) 09/12/2021   right neck posterior EDC 11/21/2021   BCC (basal cell carcinoma) 09/12/2021   right temple, EDC 02/18/2022   BCC (basal cell carcinoma) 09/12/2021   left temple, Mohs 12/18/21   BCC (basal cell carcinoma) 09/12/2021   left zygoma, Clear with biopsy 02/18/2022   BCC (basal cell carcinoma) 10/24/2021   right nasolabial fold, MOHs 01/01/2022   BPH (benign prostatic hypertrophy)    Difficulty  reading    Difficulty seeing    Difficulty in seeing certain things and reading/ not legally blind!   Gout    History of shingles    Hyperlipidemia    Hypertension    Hypothyroidism    Macular degeneration    Squamous cell carcinoma in situ (SCCIS) of dorsum of left hand 05/10/2024  left mid dorsal hand adjacent to scar, EDC   Squamous cell carcinoma of neck 09/12/2021   right neck anterior, well differentiated, base involved EDC 11/21/2021   Squamous cell carcinoma of skin 11/21/2021   right dorsal hand, EDC   Squamous cell carcinoma of skin 04/21/2022   R med cheek, Atypical Squamous Proliferation w/Cystic Change, exc 06/09/2022  Mohs surgery for a recurrent moderately differentiated SCC R med cheek 07/23/22   Squamous cell carcinoma of skin 02/02/2024   Left Dorsal Hand, clear with biopsy   Squamous cell carcinoma of skin 02/02/2024   Left Angle of Mandible, clear with biopsy   Past Surgical History:  Procedure Laterality Date   COLONOSCOPY     CORONARY ATHERECTOMY N/A 09/26/2021   Procedure: CORONARY ATHERECTOMY;  Surgeon: Elmira Newman PARAS, MD;  Location: [facility];  Service: Cardiovascular;  Laterality: N/A;   CORONARY IMAGING/OCT N/A 09/26/2021   Procedure: INTRAVASCULAR IMAGING/OCT;  Surgeon: Elmira Newman PARAS, MD;  Location: [facility];  Service: Cardiovascular;  Laterality: N/A;   CORONARY STENT INTERVENTION N/A 09/26/2021   Procedure: CORONARY STENT INTERVENTION;  Surgeon: Elmira Newman PARAS, MD;  Location: [facility];  Service: Cardiovascular;  Laterality: N/A;   LEFT HEART CATH AND CORONARY ANGIOGRAPHY N/A 09/25/2021   Procedure: LEFT HEART CATH AND CORONARY ANGIOGRAPHY;  Surgeon: Elmira Newman PARAS, MD;  Location: [facility];  Service: Cardiovascular;  Laterality: N/A;   POLYPECTOMY     Social History   Tobacco Use   Smoking status: Former    Current packs/day: 0.00    Average packs/day: 1.5 packs/day for 15.0 years (22.5  ttl pk-yrs)    Types: Cigarettes    Start date: 10/13/1953    Quit date: 10/13/1968    Years since quitting: 55.6   Smokeless tobacco: Never  Vaping Use   Vaping status: Never Used  Substance Use Topics   Alcohol use: No    Comment: no alcohol in 20 years   Drug use: No   Family History  Problem Relation Age of Onset   Heart disease Mother        CABG   Heart disease Father        CAD   Cancer Father        lung CA   Cancer Brother        lung CA   No Known Allergies Current Outpatient Medications on File Prior to Visit  Medication Sig Dispense Refill   allopurinol  (ZYLOPRIM ) 100 MG tablet TAKE ONE TABLET BY MOUTH ONCE A DAY 90 tablet 0   aspirin  81 MG EC tablet Take 1 tablet (81 mg total) by mouth daily. Swallow whole. 30 tablet 2   cyanocobalamin  (VITAMIN B12) 1000 MCG tablet Take 1,000 mcg by mouth daily.     levothyroxine  (SYNTHROID ) 88 MCG tablet TAKE ONE TABLET BY MOUTH ONCE A DAY BEFORE BREAKFAST. 90 tablet 1   lisinopril  (ZESTRIL ) 10 MG tablet TAKE ONE TABLET BY MOUTH DAILY 90 tablet 1   metoprolol  succinate (TOPROL -XL) 25 MG 24 hr tablet Take 0.5 tablets (12.5 mg total) by mouth daily. Take with or immediately following a meal. 45 tablet 3   nitroGLYCERIN  (NITROSTAT ) 0.4 MG SL tablet Place 1 tablet (0.4 mg total) under the tongue every 5 (five) minutes as needed for chest pain. 30 tablet 2   rosuvastatin  (CRESTOR ) 20 MG tablet TAKE ONE TABLET BY MOUTH ONCE A DAY 90 tablet 0   No current facility-administered medications on  file prior to visit.    Review of Systems  Constitutional:  Negative for activity change, appetite change, fatigue, fever and unexpected weight change.  HENT:  Positive for hearing loss. Negative for congestion, rhinorrhea, sore throat and trouble swallowing.   Eyes:  Positive for visual disturbance. Negative for pain, redness and itching.  Respiratory:  Negative for cough, chest tightness, shortness of breath and wheezing.   Cardiovascular:   Negative for chest pain and palpitations.  Gastrointestinal:  Negative for abdominal pain, blood in stool, constipation, diarrhea and nausea.  Endocrine: Negative for cold intolerance, heat intolerance, polydipsia and polyuria.  Genitourinary:  Negative for difficulty urinating, dysuria, frequency and urgency.  Musculoskeletal:  Negative for arthralgias, joint swelling and myalgias.  Skin:  Negative for pallor and rash.  Neurological:  Negative for dizziness, tremors, weakness, numbness and headaches.  Hematological:  Negative for adenopathy. Does not bruise/bleed easily.  Psychiatric/Behavioral:  Negative for decreased concentration and dysphoric mood. The patient is not nervous/anxious.        Objective:   Physical Exam Constitutional:      General: He is not in acute distress.    Appearance: Normal appearance. He is well-developed and normal weight. He is not ill-appearing or diaphoretic.  HENT:     Head: Normocephalic and atraumatic.     Right Ear: Tympanic membrane, ear canal and external ear normal.     Left Ear: Tympanic membrane, ear canal and external ear normal.     Ears:     Comments: Non impacted cerumen bilaterally    Nose: Nose normal. No congestion.     Mouth/Throat:     Mouth: Mucous membranes are moist.     Pharynx: Oropharynx is clear. No posterior oropharyngeal erythema.  Eyes:     General: No scleral icterus.       Right eye: No discharge.        Left eye: No discharge.     Conjunctiva/sclera: Conjunctivae normal.     Pupils: Pupils are equal, round, and reactive to light.  Neck:     Thyroid : No thyromegaly.     Vascular: No carotid bruit or JVD.  Cardiovascular:     Rate and Rhythm: Normal rate and regular rhythm.     Pulses: Normal pulses.     Heart sounds: Normal heart sounds.     No gallop.  Pulmonary:     Effort: Pulmonary effort is normal. No respiratory distress.     Breath sounds: Normal breath sounds. No wheezing or rales.     Comments: Good  air exch Chest:     Chest wall: No tenderness.  Abdominal:     General: Bowel sounds are normal. There is no distension or abdominal bruit.     Palpations: Abdomen is soft. There is no mass.     Tenderness: There is no abdominal tenderness.     Hernia: No hernia is present.  Musculoskeletal:        General: No tenderness.     Cervical back: Normal range of motion and neck supple. No rigidity. No muscular tenderness.     Right lower leg: No edema.     Left lower leg: No edema.  Lymphadenopathy:     Cervical: No cervical adenopathy.  Skin:    General: Skin is warm and dry.     Coloration: Skin is not pale.     Findings: No erythema or rash.     Comments: Solar lentigines diffusely Mildly tanned   Neurological:  Mental Status: He is alert.     Cranial Nerves: No cranial nerve deficit.     Motor: No abnormal muscle tone.     Coordination: Coordination normal.     Gait: Gait normal.     Deep Tendon Reflexes: Reflexes are normal and symmetric. Reflexes normal.  Psychiatric:        Mood and Affect: Mood normal.        Cognition and Memory: Cognition and memory normal.     Comments: Pleasant  Mentally sharp            Assessment & Plan:   Problem List Items Addressed This Visit       Cardiovascular and Mediastinum   Primary hypertension - Primary   bp in fair control at this time  BP Readings from Last 1 Encounters:  06/01/24 110/70   No changes needed Most recent labs reviewed  Disc lifstyle change with low sodium diet and exercise  Plan to continue Lisinopril  10 mg daily  Metoprolol  xl 12.5 mg daily      Relevant Orders   TSH   Lipid panel   Comprehensive metabolic panel with GFR   CBC with Differential/Platelet     Endocrine   Hypothyroidism   Hypothyroidism  Pt has no clinical changes No change in energy level/ hair or skin/ edema and no tremor Lab Results  Component Value Date   TSH 3.14 05/29/2023  Plan to continue levothyroxine  88 mcg daily       Relevant Orders   TSH     Genitourinary   Benign prostatic hyperplasia   Pt notes more frequent urination and some dribbling  No dysuria  Not very bothersome  I recommend follow up with urology  He declines but will call if symptoms worsen or bother him         Hematopoietic and Hemostatic   Thrombocytopenia (HCC)   Cbc today  No change in bruising /bleeding       Relevant Orders   CBC with Differential/Platelet     Other   Vitamin B12 deficiency   B12 today Now taking 1000 mcg daily       Relevant Orders   Vitamin B12   Prediabetes   A1c ordered Diet is fair - fixes food he likes         Relevant Orders   Hemoglobin A1c   Low HDL (under 40)   Lipids today  Disc goals for lipids and reasons to control them Rev last labs with pt Rev low sat fat diet in detail Takes rosuvastatin        Gout   No recent flares On allopurinol  100 mg daily  Tries to stay hydrated  Uric acid ordered today      Relevant Orders   Uric acid   Elevated ferritin   In past Then normalized Re check today      Anemia   Cbc today  With iron tests   Elevated ferritin in past , then normalized No symptoms       Relevant Orders   CBC with Differential/Platelet   Ferritin   Iron

## 2024-06-01 NOTE — Assessment & Plan Note (Signed)
 Cbc today  No change in bruising /bleeding

## 2024-06-01 NOTE — Assessment & Plan Note (Signed)
Hypothyroidism  Pt has no clinical changes No change in energy level/ hair or skin/ edema and no tremor Lab Results  Component Value Date   TSH 3.14 05/29/2023  Plan to continue levothyroxine 88 mcg daily

## 2024-06-01 NOTE — Assessment & Plan Note (Signed)
 No recent flares On allopurinol  100 mg daily  Tries to stay hydrated  Uric acid ordered today

## 2024-06-01 NOTE — Assessment & Plan Note (Signed)
 B12 today Now taking 1000 mcg daily

## 2024-06-01 NOTE — Assessment & Plan Note (Signed)
 Lipids today  Disc goals for lipids and reasons to control them Rev last labs with pt Rev low sat fat diet in detail Takes rosuvastatin 

## 2024-06-01 NOTE — Assessment & Plan Note (Signed)
 bp in fair control at this time  BP Readings from Last 1 Encounters:  06/01/24 110/70   No changes needed Most recent labs reviewed  Disc lifstyle change with low sodium diet and exercise  Plan to continue Lisinopril  10 mg daily  Metoprolol  xl 12.5 mg daily

## 2024-06-01 NOTE — Assessment & Plan Note (Signed)
 Pt notes more frequent urination and some dribbling  No dysuria  Not very bothersome  I recommend follow up with urology  He declines but will call if symptoms worsen or bother him

## 2024-06-01 NOTE — Patient Instructions (Addendum)
 You are due for a tetanus shot  You can get that at the pharmacy   Get a flu shot in the fall   If the urinary leakage does not improve or gets worse I would like to have you see urology (Dr Kennith)  Please let us  know   For bone health get vitamin D3 over the counter and take 2000 international units daily   Stay active  Think about using more sunscreen when outdoors   Try and eat a balanced diet   Labs today

## 2024-06-01 NOTE — Assessment & Plan Note (Signed)
 In past Then normalized Re check today

## 2024-06-01 NOTE — Assessment & Plan Note (Addendum)
 Cbc today  With iron tests   Elevated ferritin in past , then normalized No symptoms

## 2024-06-01 NOTE — Assessment & Plan Note (Signed)
 A1c ordered Diet is fair - fixes food he likes

## 2024-06-07 ENCOUNTER — Ambulatory Visit (HOSPITAL_COMMUNITY)
Admission: RE | Admit: 2024-06-07 | Discharge: 2024-06-07 | Disposition: A | Discharge status: Home / Self Care | Source: Ambulatory Visit | Attending: Cardiovascular Disease | Admitting: Cardiovascular Disease

## 2024-06-07 DIAGNOSIS — I10 Essential (primary) hypertension: Secondary | ICD-10-CM | POA: Diagnosis not present

## 2024-06-07 DIAGNOSIS — I77810 Thoracic aortic ectasia: Secondary | ICD-10-CM | POA: Insufficient documentation

## 2024-06-07 DIAGNOSIS — I2510 Atherosclerotic heart disease of native coronary artery without angina pectoris: Secondary | ICD-10-CM | POA: Diagnosis not present

## 2024-06-07 LAB — ECHOCARDIOGRAM COMPLETE
Area-P 1/2: 2.56 cm2
P 1/2 time: 825 ms
S' Lateral: 3 cm

## 2024-06-19 ENCOUNTER — Ambulatory Visit: Payer: Self-pay | Admitting: Cardiology

## 2024-06-27 DIAGNOSIS — H353134 Nonexudative age-related macular degeneration, bilateral, advanced atrophic with subfoveal involvement: Secondary | ICD-10-CM | POA: Diagnosis not present

## 2024-06-29 ENCOUNTER — Other Ambulatory Visit: Payer: Self-pay | Admitting: Family Medicine

## 2024-07-06 ENCOUNTER — Other Ambulatory Visit: Payer: Self-pay | Admitting: Family Medicine

## 2024-07-06 DIAGNOSIS — M1A9XX0 Chronic gout, unspecified, without tophus (tophi): Secondary | ICD-10-CM

## 2024-07-07 ENCOUNTER — Encounter: Payer: Self-pay | Admitting: Cardiology

## 2024-07-07 ENCOUNTER — Ambulatory Visit: Attending: Cardiology | Admitting: Cardiology

## 2024-07-07 VITALS — BP 128/86 | HR 70 | Resp 16 | Ht 70.0 in | Wt 195.6 lb

## 2024-07-07 DIAGNOSIS — I2510 Atherosclerotic heart disease of native coronary artery without angina pectoris: Secondary | ICD-10-CM | POA: Insufficient documentation

## 2024-07-07 DIAGNOSIS — I77810 Thoracic aortic ectasia: Secondary | ICD-10-CM | POA: Insufficient documentation

## 2024-07-07 DIAGNOSIS — I10 Essential (primary) hypertension: Secondary | ICD-10-CM | POA: Insufficient documentation

## 2024-07-07 NOTE — Progress Notes (Signed)
 Cardiology Office Note:  .   Date:  07/07/2024  ID:  Jamie Byrd, DOB 06-Aug-1934, MRN [id number] PCP:  Randeen Laine LABOR, MD  Former Cardiology Providers: Dr. Annalee Casa [facility] Providers Cardiologist:  Madonna Large, DO , [facility] (established care 01/05/24) Electrophysiologist:  None  Click to update primary MD,subspecialty MD or APP then REFRESH:1}    Chief Complaint  Patient presents with   Coronary artery disease involving native coronary artery of   Follow-up    History of Present Illness: .   Jamie Byrd is a 89 y.o. Caucasian male whose past medical history and cardiovascular risk factors includes: Hypertension, hypothyroidism, prediabetes, former smoker, history of CAD status post complex PCI to the LAD (December 2022).  Patient had establish care with myself back in March 2025 given his CAD and prior coronary intervention.  Patient is accompanied by his sister at today's office visit. No anginal chest pain or heart failure symptoms since last office encounter. Still enjoys splitting wood and being active. No hospitalizations or urgent care visits for cardiovascular reasons. Recent echocardiogram from 06/07/2024 independently reviewed with the patient and sister at today's office visit and findings summarized below.  Outside laboratory/20/2025 also reviewed independently.   Review of Systems: .   Review of Systems  Cardiovascular:  Negative for chest pain, claudication, irregular heartbeat, leg swelling, near-syncope, orthopnea, palpitations, paroxysmal nocturnal dyspnea and syncope.  Respiratory:  Negative for shortness of breath.   Hematologic/Lymphatic: Negative for bleeding problem.    Studies Reviewed:   Echocardiogram: 12/30/2021: LVEF 60 to 65%, normal diastolic function, Aortic root is mildly dilated (Sinus of Valsalva 3.96cm and  Sinotubular junction 3.8cm). Proximal ascending aorta upper limit of normal 3.7cm.Compared to study 09/25/2021  Aortic root was 43mm and now aortic root measures (Sinus of Valsalva 3.96cm and  Sinotubular junction 3.8cm).  02/2023: LVEF 55 to 60%, normal diastolic function,Aortic root 39 mm  06/07/2024 1. Left ventricular ejection fraction, by estimation, is 60 to 65%. Left ventricular ejection fraction by 3D volume is 65 %. The left ventricle has normal function. The left ventricle has no regional wall motion abnormalities. Left ventricular diastolic parameters were normal. The average left ventricular global longitudinal strain is -20.5 %. The global longitudinal strain is normal. 2. Right ventricular systolic function is normal. The right ventricular size is normal. There is normal pulmonary artery systolic pressure. The estimated right ventricular systolic pressure is 19.3 mmHg. 3. The mitral valve is normal in structure. Trivial mitral valve regurgitation. No evidence of mitral stenosis. 4. The aortic valve is tricuspid. Aortic valve regurgitation is trivial. Aortic valve sclerosis is present, with no evidence of aortic valve stenosis. 5. The inferior vena cava is dilated in size with >50% respiratory variability, suggesting right atrial pressure of 8 mmHg. 6. Ascending aorta measurements are within normal limits for age when indexed to body surface area.  Comparison(s): A prior study was performed on 09/25/2021. LVEF improved from 50-55% to 60-65%. Aortic root 43mm and proximal ascending aorta 38mm.   Coronary angiogram 09/25/2021: LM: Normal LAD: Ostial 99% stenosis with severe calcification Ramus: Mid 80% eccentric stenosis with severe calcification Lcx: Small OM1 with 50% disease        Large OM1 with mid 30% disease RCA: Prox 50% stenosis with severe calcification   LVEDP normal LVEF normal on echocardiogram   Given location of the ostial LAD lesion with severe calcification with a large ramus arising at an acute angle with respect to LAD.  Will consult  CVTS for possible CABG to LAD and  ramus Limiting factors could be age and aorta calcification. 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 He does not have living children, but has brothers and sisters in their 82s. I spoke with his sister Consuelo Cutter [phone])   If not a surgical candidate, could consider high risk PCI ostial LAD, along with PCI to ramus   Coronary intervention 09/26/2021: Coronary intervention: 80% mid high diag stenosis.  99% ostial LAD stenosis Successful percutaneous coronary intervention mid high diag and ostial LAD        PTCA and overlapping stents placement high diag        3.0 X 22 mm Onyx Frontier drug-eluting stent        3.0 X 15 mm Onyx Frontier drug-eluting stent         Post dilatation with 3.25 mm [place] balloon        80%--->0% residual stenosis. TIMI flow II-->III        PTCA and overlapping stents placement ostial LAD        3.0 X 15 mm Onyx Frontier drug-eluting stent        Post dilatation with 3.5 mm [place] balloon        99%--->0% residual stenosis. TIMI flow II-->III  RADIOLOGY: NA  Risk Assessment/Calculations:   NA   Labs:       Latest Ref Rng & Units 06/01/2024    9:04 AM 08/14/2023    9:03 AM 06/05/2023    8:24 AM  CBC  WBC 4.0 - 10.5 K/uL 6.7  5.4  5.8   Hemoglobin 13.0 - 17.0 g/dL 86.8  87.2  87.1   Hematocrit 39.0 - 52.0 % 40.1  39.5  39.0   Platelets 150.0 - 400.0 K/uL 365.0  127.0  131        Latest Ref Rng & Units 06/01/2024    9:04 AM 06/05/2023    8:24 AM 05/29/2023    9:32 AM  BMP  Glucose 70 - 99 mg/dL 91  897  86   BUN 6 - 23 mg/dL 13  14  13    Creatinine 0.40 - 1.50 mg/dL 9.12  9.02  9.06   Sodium 135 - 145 mEq/L 138  137  132   Potassium 3.5 - 5.1 mEq/L 4.6  5.1  4.2   Chloride 96 - 112 mEq/L 102  102  99   CO2 19 - 32 mEq/L 30  29  28    Calcium  8.4 - 10.5 mg/dL 9.5  9.8  9.9       Latest Ref Rng & Units 06/01/2024    9:04 AM 06/05/2023    8:24 AM 05/29/2023    9:32 AM  CMP  Glucose 70 - 99 mg/dL 91  897  86    BUN 6 - 23 mg/dL 13  14  13    Creatinine 0.40 - 1.50 mg/dL 9.12  9.02  9.06   Sodium 135 - 145 mEq/L 138  137  132   Potassium 3.5 - 5.1 mEq/L 4.6  5.1  4.2   Chloride 96 - 112 mEq/L 102  102  99   CO2 19 - 32 mEq/L 30  29  28    Calcium  8.4 - 10.5 mg/dL 9.5  9.8  9.9   Total Protein 6.0 - 8.3 g/dL 6.8   6.7   Total Bilirubin 0.2 - 1.2 mg/dL 0.9   1.3   Alkaline Phos 39 -  117 U/L 104   102   AST 0 - 37 U/L 23   24   ALT 0 - 53 U/L 11   9     Lab Results  Component Value Date   CHOL 74 06/01/2024   HDL 28.70 (L) 06/01/2024   LDLCALC 29 06/01/2024   TRIG 78.0 06/01/2024   CHOLHDL 3 06/01/2024   No results for input(s): LIPOA in the last 8760 hours. No components found for: NTPROBNP No results for input(s): PROBNP in the last 8760 hours. Recent Labs    06/01/24 0904  TSH 4.74    Physical Exam:    Today's Vitals   07/07/24 0852 07/07/24 0914  BP: (!) 142/73 128/86  Pulse: 67 70  Resp: 16   SpO2: 97%   Weight: 195 lb 9.6 oz (88.7 kg)   Height: 5' 10 (1.778 m)    Body mass index is 28.07 kg/m. Wt Readings from Last 3 Encounters:  07/07/24 195 lb 9.6 oz (88.7 kg)  06/01/24 195 lb 6 oz (88.6 kg)  03/01/24 200 lb (90.7 kg)    Physical Exam  Constitutional: No distress.  hemodynamically stable  Neck: No JVD present.  Cardiovascular: Normal rate, regular rhythm, S1 normal and S2 normal. Exam reveals no gallop, no S3 and no S4.  No murmur heard. Pulmonary/Chest: Effort normal and breath sounds normal. No stridor. He has no wheezes. He has no rales.  Musculoskeletal:        General: No edema.     Cervical back: Neck supple.  Skin: Skin is warm.     Impression & Recommendation(s):  Impression:   ICD-10-CM   1. Coronary artery disease involving native coronary artery of native heart without angina pectoris  I25.10     2. Dilated aortic root  I77.810     3. Essential hypertension  I10        Recommendation(s):  Coronary artery disease involving  native coronary artery of native heart without angina pectoris Denies anginal chest pain. No use of sublingual nitroglycerin  tablets since the last office visit. Echocardiogram August 2025: Preserved LVEF, no significant valvular heart disease. Continue aspirin  and statin therapy. Continue lisinopril  and Toprol -XL Lipids are well-controlled LDL currently 29 mg/dL as of June 01, 2024 Overall functional capacity remains stable No additional workup warranted at this time in an asymptomatic male  Dilated aortic root (HCC) History of dilated aortic root. Most recent echocardiogram from August 2025 notes normal dimensions when corrected for body surface area. Reemphasize importance of blood pressure monitoring  Essential hypertension Office blood pressures are well-controlled, on repeat. Continue lisinopril  10 mg p.o. daily. Continue Toprol -XL 12.5 mg p.o. daily. Reemphasized importance of low-salt diet.  Orders Placed:  No orders of the defined types were placed in this encounter.    Final Medication List:   No orders of the defined types were placed in this encounter.   There are no discontinued medications.   Current Outpatient Medications:    allopurinol  (ZYLOPRIM ) 100 MG tablet, TAKE ONE TABLET BY MOUTH ONCE A DAY, Disp: 90 tablet, Rfl: 3   aspirin  81 MG EC tablet, Take 1 tablet (81 mg total) by mouth daily. Swallow whole., Disp: 30 tablet, Rfl: 2   cyanocobalamin  (VITAMIN B12) 1000 MCG tablet, Take 1,000 mcg by mouth daily., Disp: , Rfl:    levothyroxine  (SYNTHROID ) 88 MCG tablet, Take 1 tablet (88 mcg total) by mouth daily before breakfast., Disp: 90 tablet, Rfl: 3   lisinopril  (ZESTRIL ) 10 MG tablet, TAKE  ONE TABLET BY MOUTH DAILY, Disp: 90 tablet, Rfl: 2   metoprolol  succinate (TOPROL -XL) 25 MG 24 hr tablet, Take 0.5 tablets (12.5 mg total) by mouth daily. Take with or immediately following a meal., Disp: 45 tablet, Rfl: 3   nitroGLYCERIN  (NITROSTAT ) 0.4 MG SL tablet, Place  1 tablet (0.4 mg total) under the tongue every 5 (five) minutes as needed for chest pain., Disp: 30 tablet, Rfl: 2   rosuvastatin  (CRESTOR ) 20 MG tablet, TAKE ONE TABLET BY MOUTH ONCE A DAY, Disp: 90 tablet, Rfl: 0  Consent:   NA  Disposition:   1 year follow-up sooner if needed  His questions and concerns were addressed to his satisfaction. He voices understanding of the recommendations provided during this encounter.    Signed, Madonna Michele HAS, [facility]  A Division of [facility] [address], [place], [place] [id number]   07/07/2024 9:19 AM

## 2024-07-07 NOTE — Patient Instructions (Signed)
 Medication Instructions:   No changes  *If you need a refill on your cardiac medications before your next appointment, please call your pharmacy*   Lab Work: Not needed If you have labs (blood work) drawn today and your tests are completely normal, you will receive your results only by: MyChart Message (if you have MyChart) OR A paper copy in the mail If you have any lab test that is abnormal or we need to change your treatment, we will call you to review the results.   Testing/Procedures:  Not needed  Follow-Up: At [facility], you and your health needs are our priority.  As part of our continuing mission to provide you with exceptional heart care, we have created designated Provider Care Teams.  These Care Teams include your primary Cardiologist (physician) and Advanced Practice Providers (APPs -  Physician Assistants and Nurse Practitioners) who all work together to provide you with the care you need, when you need it.     Your next appointment:   12 month(s)  The format for your next appointment:   In Person  Provider:   Madonna Large, DO

## 2024-08-04 ENCOUNTER — Ambulatory Visit (INDEPENDENT_AMBULATORY_CARE_PROVIDER_SITE_OTHER)

## 2024-08-04 DIAGNOSIS — Z23 Encounter for immunization: Secondary | ICD-10-CM | POA: Diagnosis not present

## 2024-08-06 DIAGNOSIS — S9782XA Crushing injury of left foot, initial encounter: Secondary | ICD-10-CM | POA: Diagnosis not present

## 2024-08-08 DIAGNOSIS — H353134 Nonexudative age-related macular degeneration, bilateral, advanced atrophic with subfoveal involvement: Secondary | ICD-10-CM | POA: Diagnosis not present

## 2024-08-23 DIAGNOSIS — S9032XA Contusion of left foot, initial encounter: Secondary | ICD-10-CM | POA: Diagnosis not present

## 2024-08-23 DIAGNOSIS — S9782XA Crushing injury of left foot, initial encounter: Secondary | ICD-10-CM | POA: Diagnosis not present

## 2024-09-13 DIAGNOSIS — S9782XD Crushing injury of left foot, subsequent encounter: Secondary | ICD-10-CM | POA: Diagnosis not present

## 2024-09-13 DIAGNOSIS — S9032XD Contusion of left foot, subsequent encounter: Secondary | ICD-10-CM | POA: Diagnosis not present

## 2024-09-19 DIAGNOSIS — H353134 Nonexudative age-related macular degeneration, bilateral, advanced atrophic with subfoveal involvement: Secondary | ICD-10-CM | POA: Diagnosis not present

## 2024-09-19 DIAGNOSIS — H31092 Other chorioretinal scars, left eye: Secondary | ICD-10-CM | POA: Diagnosis not present

## 2024-09-19 DIAGNOSIS — H35433 Paving stone degeneration of retina, bilateral: Secondary | ICD-10-CM | POA: Diagnosis not present

## 2024-09-19 DIAGNOSIS — H43813 Vitreous degeneration, bilateral: Secondary | ICD-10-CM | POA: Diagnosis not present

## 2024-09-19 DIAGNOSIS — Z961 Presence of intraocular lens: Secondary | ICD-10-CM | POA: Diagnosis not present

## 2024-09-19 DIAGNOSIS — H35033 Hypertensive retinopathy, bilateral: Secondary | ICD-10-CM | POA: Diagnosis not present

## 2024-09-19 DIAGNOSIS — H35423 Microcystoid degeneration of retina, bilateral: Secondary | ICD-10-CM | POA: Diagnosis not present

## 2024-10-03 ENCOUNTER — Other Ambulatory Visit: Payer: Self-pay | Admitting: Family Medicine

## 2024-11-08 ENCOUNTER — Ambulatory Visit: Admitting: Dermatology

## 2024-11-15 ENCOUNTER — Ambulatory Visit: Admitting: Dermatology

## 2024-12-21 ENCOUNTER — Ambulatory Visit: Admitting: Dermatology

## 2025-03-02 ENCOUNTER — Ambulatory Visit

## 2025-03-07 ENCOUNTER — Ambulatory Visit

## 2025-06-05 ENCOUNTER — Encounter: Admitting: Family Medicine
